# Patient Record
Sex: Male | Born: 1960
Health system: Southern US, Community
[De-identification: ages and names within clinical notes are randomized; demographics above are authoritative.]

## PROBLEM LIST (undated history)

## (undated) DIAGNOSIS — M1990 Unspecified osteoarthritis, unspecified site: Secondary | ICD-10-CM

## (undated) DIAGNOSIS — I10 Essential (primary) hypertension: Secondary | ICD-10-CM

## (undated) DIAGNOSIS — F419 Anxiety disorder, unspecified: Secondary | ICD-10-CM

## (undated) DIAGNOSIS — E119 Type 2 diabetes mellitus without complications: Secondary | ICD-10-CM

## (undated) DIAGNOSIS — E7800 Pure hypercholesterolemia, unspecified: Secondary | ICD-10-CM

## (undated) DIAGNOSIS — Z955 Presence of coronary angioplasty implant and graft: Secondary | ICD-10-CM

## (undated) DIAGNOSIS — I2510 Atherosclerotic heart disease of native coronary artery without angina pectoris: Secondary | ICD-10-CM

## (undated) HISTORY — DX: Presence of coronary angioplasty implant and graft: Z95.5

## (undated) HISTORY — PX: COLONOSCOPY: SHX174

## (undated) HISTORY — DX: Unspecified osteoarthritis, unspecified site: M19.90

## (undated) HISTORY — DX: Anxiety disorder, unspecified: F41.9

## (undated) HISTORY — PX: CORONARY ANGIOPLASTY WITH STENT PLACEMENT: SHX49

---

## 2009-04-23 ENCOUNTER — Ambulatory Visit: Payer: Self-pay | Admitting: Diagnostic Radiology

## 2009-04-23 ENCOUNTER — Emergency Department (HOSPITAL_BASED_OUTPATIENT_CLINIC_OR_DEPARTMENT_OTHER): Admission: EM | Admit: 2009-04-23 | Discharge: 2009-04-23 | Payer: Self-pay | Admitting: Emergency Medicine

## 2009-04-23 IMAGING — CR DG FINGER MIDDLE 2+V*R*
3 series · 3 of 3 positions shown · non-contrast
Comparison: None.

CLINICAL DATA: 48-year-old male with right middle finger pain.
Possible metal foreign body.

RIGHT MIDDLE FINGER 2+V

[x finger pa right]
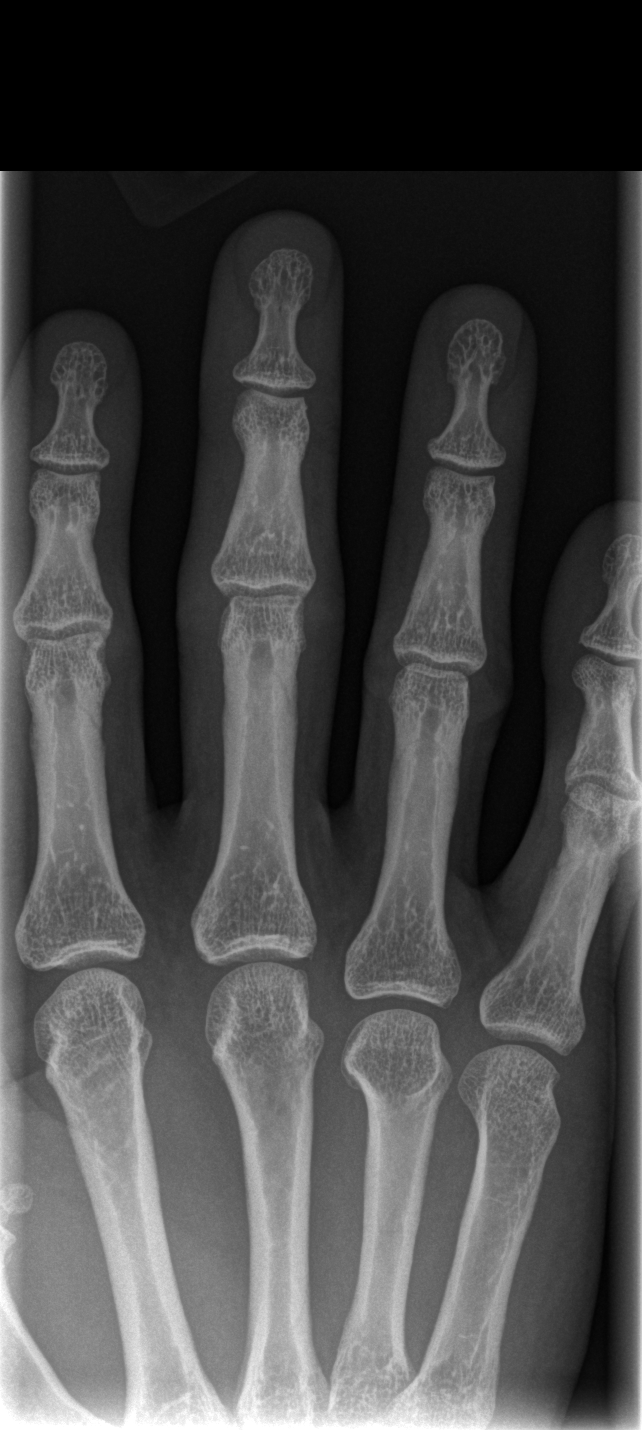

[x finger obl. right]
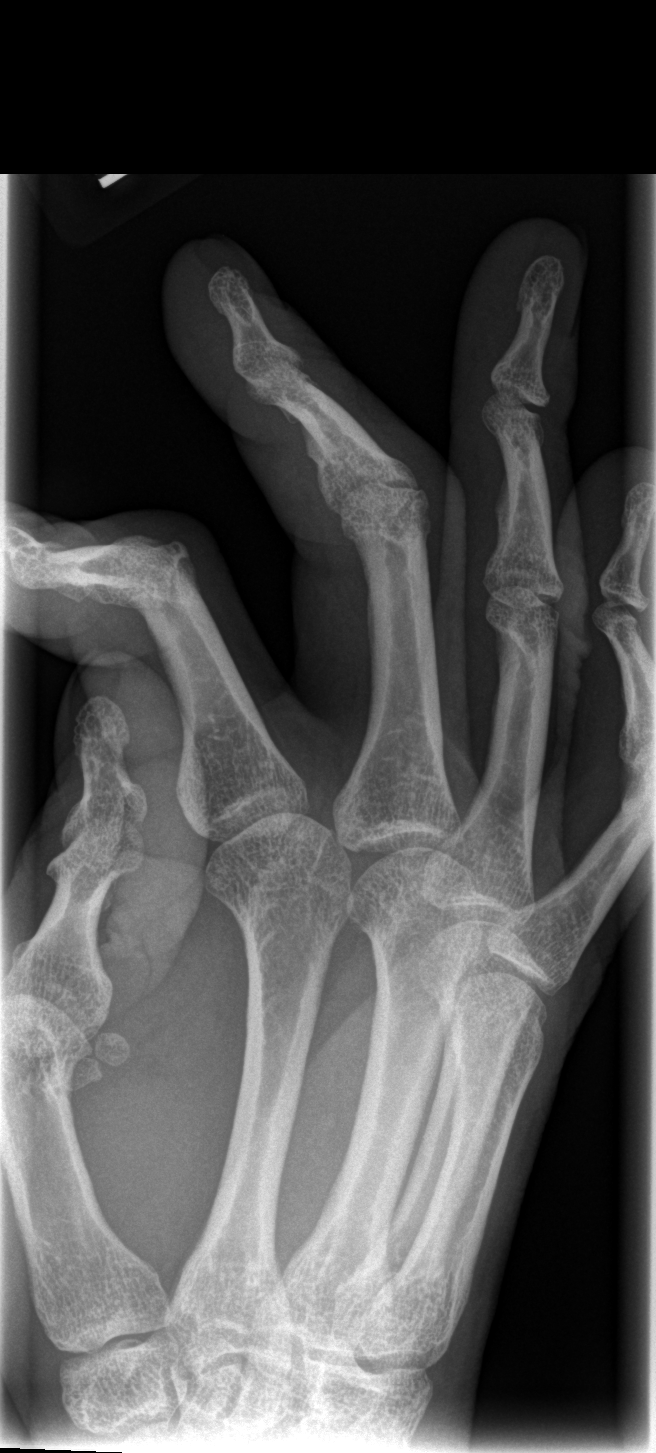

[x finger lateral right]
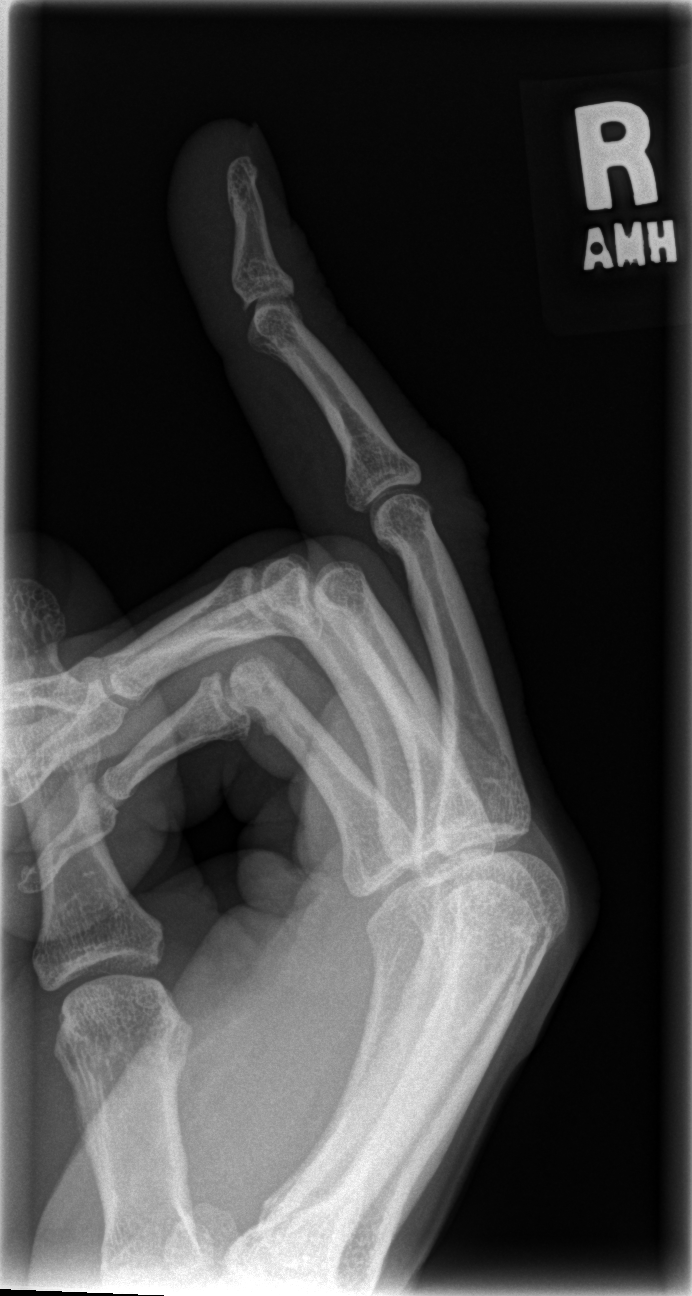

[3 of 3 positions shown; findings below may reference images not displayed]

FINDINGS: Bone mineralization is within normal limits.  Normal
joint spaces.  No acute fracture. No radiopaque foreign body
identified.
IMPRESSION: No radiopaque foreign body identified.  No acute fracture.

## 2013-11-27 DIAGNOSIS — T148XXA Other injury of unspecified body region, initial encounter: Secondary | ICD-10-CM | POA: Insufficient documentation

## 2015-03-17 ENCOUNTER — Encounter (HOSPITAL_BASED_OUTPATIENT_CLINIC_OR_DEPARTMENT_OTHER): Payer: Self-pay

## 2015-03-17 ENCOUNTER — Emergency Department (HOSPITAL_BASED_OUTPATIENT_CLINIC_OR_DEPARTMENT_OTHER)
Admission: EM | Admit: 2015-03-17 | Discharge: 2015-03-17 | Disposition: A | Discharge status: Home / Self Care | Payer: Self-pay | Attending: Emergency Medicine | Admitting: Emergency Medicine

## 2015-03-17 DIAGNOSIS — E119 Type 2 diabetes mellitus without complications: Secondary | ICD-10-CM | POA: Insufficient documentation

## 2015-03-17 DIAGNOSIS — R1030 Lower abdominal pain, unspecified: Secondary | ICD-10-CM | POA: Insufficient documentation

## 2015-03-17 HISTORY — DX: Type 2 diabetes mellitus without complications: E11.9

## 2015-03-17 LAB — URINALYSIS, ROUTINE W REFLEX MICROSCOPIC
BILIRUBIN URINE: NEGATIVE
Glucose, UA: >1000 mg/dL — AB
HGB URINE DIPSTICK: NEGATIVE
KETONES UR: NEGATIVE mg/dL
Leukocytes, UA: NEGATIVE
NITRITE: NEGATIVE
PH: 6 (ref 5.0–8.0)
Protein, ur: NEGATIVE mg/dL
SPECIFIC GRAVITY, URINE: 1.034 — AB (ref 1.005–1.030)
UROBILINOGEN UA: 1 mg/dL (ref 0.0–1.0)

## 2015-03-17 LAB — URINE MICROSCOPIC-ADD ON

## 2015-03-17 LAB — CBG MONITORING, ED: Glucose-Capillary: 203 mg/dL — ABNORMAL HIGH (ref 65–99)

## 2015-03-17 NOTE — ED Provider Notes (Signed)
TIME SEEN: 10:37 PM  CHIEF COMPLAINT: abdominal pain  HPI: : Dean Robertson is a 54 y.o. Male with a history of NIDDM who presents to the Emergency Department complaining of lower abdominal pain onset one month ago. He states that the pain started burning today which is why he came in. He states that he tried to go to [facility], but they were very busy and he left. He denies nausea, vomiting, diarrhea, melena, hematochezia, hematuria, urinary frequency or urgency, dysuria or hematuria, renal discharge, testicular pain or swelling, fever or chills. He denies travel outside the country or any sick contacts recently. He denies any significant medical history. He states that he is on medication for his DM but does not remember what it is called and denies checking his blood sugar often. He denies currently having a gastroenterologist. Has never had similar pain before. States he is having normal bowel movements and passing gas. Last bowel movement was this morning. Aggravating or relieving factors or radiation of pain.  ROS: See HPI Constitutional: no fever  Eyes: no drainage  ENT: no runny nose   Cardiovascular:  no chest pain  Resp: no SOB  GI: no vomiting GU: no dysuria Integumentary: no rash  Allergy: no hives  Musculoskeletal: no leg swelling  Neurological: no slurred speech ROS otherwise negative  PAST MEDICAL HISTORY/PAST SURGICAL HISTORY:  Past Medical History  Diagnosis Date  . Diabetes mellitus without complication     MEDICATIONS:  Prior to Admission medications   Not on File    ALLERGIES:  No Known Allergies  SOCIAL HISTORY:  History  Substance Use Topics  . Smoking status: Never Smoker   . Smokeless tobacco: Not on file  . Alcohol Use: No    FAMILY HISTORY: No family history on file.  EXAM: BP 125/85 mmHg  Pulse 73  Temp(Src) 97.8 F (36.6 C) (Oral)  Resp 18  Ht 5\' 8"  (1.727 m)  Wt 174 lb (78.926 kg)  BMI 26.46 kg/m2  SpO2 99%    CONSTITUTIONAL: Alert and oriented and responds appropriately to questions. Well-appearing; well-nourished HEAD: Normocephalic EYES: Conjunctivae clear, PERRL ENT: normal nose; no rhinorrhea; moist mucous membranes; pharynx without lesions noted NECK: Supple, no meningismus, no LAD  CARD: RRR; S1 and S2 appreciated; no murmurs, no clicks, no rubs, no gallops RESP: Normal chest excursion without splinting or tachypnea; breath sounds clear and equal bilaterally; no wheezes, no rhonchi, no rales, no hypoxia or respiratory distress, speaking full sentences ABD/GI: Normal bowel sounds; non-distended; soft, non-tender, no rebound, no guarding, no peritoneal signs, no tenderness at McBurney's point, negative Murphy sign GU:  Normal external genitalia, circumcised, normal urethral meatus without blood or discharge, no testicular tenderness or masses, no scrotal masses, 2+ femoral pulses bilaterally, no hernia BACK:  The back appears normal and is non-tender to palpation, there is no CVA tenderness EXT: Normal ROM in all joints; non-tender to palpation; no edema; normal capillary refill; no cyanosis, no calf tenderness or swelling    SKIN: Normal color for age and race; warm NEURO: Moves all extremities equally, sensation to light touch intact diffusely, cranial nerves II through XII intact PSYCH: The patient's mood and manner are appropriate. Grooming and personal hygiene are appropriate.  MEDICAL DECISION MAKING: Patient here with lower abdominal pain for over one month. Denies any other associated symptoms. He is afebrile, hemodynamically stable, nontoxic, smiling and laughing. His abdominal exam is completely benign. His urine shows no sign of infection. Have very low suspicion for  appendicitis, diverticulitis, colitis. I do not feel he needs emergent imaging given symptoms have been present for over one month and he is still very well-appearing. I do not feel labs would be helpful today as I think  they would likely be normal. I recommended he follow up with his primary care physician as well as a gastroenterologist if symptoms continue. Have recommended alternating Tylenol and Motrin for pain and increasing his water and fiber intake. Discussed return precautions. He verbalized understanding and is comfortable with this plan.      [place], DO 03/17/15 2333

## 2015-03-17 NOTE — ED Notes (Signed)
C/o abd pain x 1 month-denies n/v/d

## 2015-03-17 NOTE — Discharge Instructions (Signed)

## 2015-11-15 DIAGNOSIS — G629 Polyneuropathy, unspecified: Secondary | ICD-10-CM | POA: Insufficient documentation

## 2015-11-15 DIAGNOSIS — I2510 Atherosclerotic heart disease of native coronary artery without angina pectoris: Secondary | ICD-10-CM | POA: Insufficient documentation

## 2015-11-15 DIAGNOSIS — E785 Hyperlipidemia, unspecified: Secondary | ICD-10-CM | POA: Insufficient documentation

## 2016-09-02 ENCOUNTER — Emergency Department (HOSPITAL_BASED_OUTPATIENT_CLINIC_OR_DEPARTMENT_OTHER): Payer: Self-pay

## 2016-09-02 ENCOUNTER — Encounter (HOSPITAL_BASED_OUTPATIENT_CLINIC_OR_DEPARTMENT_OTHER): Payer: Self-pay | Admitting: Emergency Medicine

## 2016-09-02 ENCOUNTER — Emergency Department (HOSPITAL_BASED_OUTPATIENT_CLINIC_OR_DEPARTMENT_OTHER)
Admission: EM | Admit: 2016-09-02 | Discharge: 2016-09-02 | Disposition: A | Discharge status: Home / Self Care | Payer: Self-pay | Attending: Emergency Medicine | Admitting: Emergency Medicine

## 2016-09-02 DIAGNOSIS — H04123 Dry eye syndrome of bilateral lacrimal glands: Secondary | ICD-10-CM

## 2016-09-02 DIAGNOSIS — E119 Type 2 diabetes mellitus without complications: Secondary | ICD-10-CM | POA: Insufficient documentation

## 2016-09-02 DIAGNOSIS — Y929 Unspecified place or not applicable: Secondary | ICD-10-CM | POA: Insufficient documentation

## 2016-09-02 DIAGNOSIS — I10 Essential (primary) hypertension: Secondary | ICD-10-CM | POA: Insufficient documentation

## 2016-09-02 DIAGNOSIS — Z79899 Other long term (current) drug therapy: Secondary | ICD-10-CM | POA: Insufficient documentation

## 2016-09-02 DIAGNOSIS — Z7984 Long term (current) use of oral hypoglycemic drugs: Secondary | ICD-10-CM | POA: Insufficient documentation

## 2016-09-02 DIAGNOSIS — Z7982 Long term (current) use of aspirin: Secondary | ICD-10-CM | POA: Insufficient documentation

## 2016-09-02 DIAGNOSIS — S0501XA Injury of conjunctiva and corneal abrasion without foreign body, right eye, initial encounter: Secondary | ICD-10-CM | POA: Insufficient documentation

## 2016-09-02 DIAGNOSIS — Y9389 Activity, other specified: Secondary | ICD-10-CM | POA: Insufficient documentation

## 2016-09-02 DIAGNOSIS — S0500XA Injury of conjunctiva and corneal abrasion without foreign body, unspecified eye, initial encounter: Secondary | ICD-10-CM

## 2016-09-02 DIAGNOSIS — S0502XA Injury of conjunctiva and corneal abrasion without foreign body, left eye, initial encounter: Secondary | ICD-10-CM | POA: Insufficient documentation

## 2016-09-02 DIAGNOSIS — Y999 Unspecified external cause status: Secondary | ICD-10-CM | POA: Insufficient documentation

## 2016-09-02 DIAGNOSIS — X58XXXA Exposure to other specified factors, initial encounter: Secondary | ICD-10-CM | POA: Insufficient documentation

## 2016-09-02 HISTORY — DX: Essential (primary) hypertension: I10

## 2016-09-02 MED ORDER — ERYTHROMYCIN 5 MG/GM OP OINT: TOPICAL_OINTMENT | OPHTHALMIC | 0 refills | Status: DC

## 2016-09-02 MED ORDER — ARTIFICIAL TEARS OP OINT: TOPICAL_OINTMENT | OPHTHALMIC | 0 refills | Status: DC

## 2016-09-02 MED ORDER — TETRACAINE HCL 0.5 % OP SOLN
2.0000 [drp] | Freq: Once | OPHTHALMIC | Status: AC
  Administered 2016-09-02: 2 [drp] via OPHTHALMIC
  Filled 2016-09-02: qty 4

## 2016-09-02 MED ORDER — FLUORESCEIN SODIUM 1 MG OP STRP
1.0000 | ORAL_STRIP | Freq: Once | OPHTHALMIC | Status: AC
  Administered 2016-09-02: 1 via OPHTHALMIC
  Filled 2016-09-02: qty 1

## 2016-09-02 NOTE — ED Triage Notes (Signed)
Pt got metal in his eye 3 weeks ago, seen at [facility]. Pt reports onging bilateral eye pain and blurry vision since then.

## 2016-09-02 NOTE — ED Notes (Signed)
Pt given d/c instructions as per chart. Verbalizes understanding. No questions. Rx x 2. 

## 2016-09-02 NOTE — ED Provider Notes (Signed)
[facility] DEPT [facility] Provider Note   CSN: [id number] Arrival date & time: 09/02/16  1137     History   Chief Complaint Chief Complaint  Patient presents with  . Eye Pain    HPI Dean Robertson is a 55 y.o. male.  HPI 28 old male with past medical history hypertension and diabetes who presents with bilateral eye pain. The patient was working with metal 3 weeks ago when he was cutting it. He states the metal specs flew into his eye. He was seen at [facility] regional that time at which time several foreign bodies removed from his out side eye. He did not undergo a CT scan at that time. He was placed on romycin. Discussed follow-up with an ophthalmologist but hasn't. Since then, he has had worsening blurred vision and persistent foreign body sensation. He describes the feeling as a dry, stinging sensation in his eyes. Denies any other trauma. His tetanus is up-to-date. He also notes intermittent clear drainage from bilateral eyes.  Past Medical History:  Diagnosis Date  . Diabetes mellitus without complication ([place])   . Hypertension     There are no active problems to display for this patient.   Past Surgical History:  Procedure Laterality Date  . CORONARY ANGIOPLASTY WITH STENT PLACEMENT         Home Medications    Prior to Admission medications   Medication Sig Start Date End Date Taking? Authorizing Provider  aspirin EC 81 MG tablet Take 81 mg by mouth daily.   Yes Historical Provider, MD  lisinopril (PRINIVIL,ZESTRIL) 20 MG tablet Take 20 mg by mouth daily.   Yes Historical Provider, MD  metFORMIN (GLUCOPHAGE) 500 MG tablet Take by mouth 2 (two) times daily with a meal.   Yes Historical Provider, MD  artificial tears (LACRILUBE) OINT ophthalmic ointment Place into both eyes every 2 (two) hours while awake. 09/02/16   Duffy Bruce, MD  erythromycin ophthalmic ointment Place a 1/2 inch ribbon of ointment into the lower eyelid of both eyes twice daily for 5 days.  09/02/16   Duffy Bruce, MD    Family History No family history on file.  Social History Social History  Substance Use Topics  . Smoking status: Never Smoker  . Smokeless tobacco: Never Used  . Alcohol use No     Allergies   Review of patient's allergies indicates no known allergies.   Review of Systems Review of Systems  Constitutional: Negative for chills, fatigue and fever.  HENT: Negative for congestion and rhinorrhea.   Eyes: Positive for pain and discharge. Negative for redness and visual disturbance.  Respiratory: Negative for cough, shortness of breath and wheezing.   Cardiovascular: Negative for chest pain and leg swelling.  Gastrointestinal: Negative for abdominal pain, diarrhea, nausea and vomiting.  Genitourinary: Negative for dysuria and flank pain.  Musculoskeletal: Negative for neck pain and neck stiffness.  Skin: Negative for rash and wound.  Allergic/Immunologic: Negative for immunocompromised state.  Neurological: Negative for syncope, weakness and headaches.  All other systems reviewed and are negative.    Physical Exam Updated Vital Signs BP 117/87 (BP Location: Left Arm)   Pulse 62   Temp 97.9 F (36.6 C) (Oral)   Resp 16   Ht 5\' 8"  (1.727 m)   Wt 172 lb (78 kg)   SpO2 100%   BMI 26.15 kg/m   Physical Exam  Constitutional: He is oriented to person, place, and time. He appears well-developed and well-nourished. No distress.  HENT:  Head: Normocephalic and atraumatic.  Mouth/Throat: Oropharynx is clear and moist.  Eyes: Conjunctivae are normal.  Pupils equal, round, and reactive bilaterally. On slit lamp exam, patient noted to have superficial corneal abrasions bilaterally and diffuse, punctate fluorescein uptake. No significant rust rings. No abrasions or ulcerations within the visual field. Anterior chamber is quiet, round, without cell or flare. Visualized retina is unremarkable. Negative Seidel sign. Extraocular movements are full and  painless. No periorbital erythema or swelling.  Neck: Neck supple.  Cardiovascular: Normal rate, regular rhythm and normal heart sounds.  Exam reveals no friction rub.   No murmur heard. Pulmonary/Chest: Effort normal and breath sounds normal. No respiratory distress. He has no wheezes. He has no rales.  Abdominal: He exhibits no distension.  Musculoskeletal: He exhibits no edema.  Neurological: He is alert and oriented to person, place, and time. He exhibits normal muscle tone.  Skin: Skin is warm. Capillary refill takes less than 2 seconds.  Psychiatric: He has a normal mood and affect.  Nursing note and vitals reviewed.    ED Treatments / Results  Labs (all labs ordered are listed, but only abnormal results are displayed) Labs Reviewed - No data to display  EKG  EKG Interpretation None       Radiology Ct Orbits Wo Contrast  Result Date: 09/02/2016 CLINICAL DATA:  Metal in eye 3 weeks ago. Blurred vision right greater than left EXAM: CT ORBITS WITHOUT CONTRAST TECHNIQUE: Multidetector CT imaging of the orbits was performed following the standard protocol without intravenous contrast. COMPARISON:  None. FINDINGS: Orbits: No metal foreign body seen in the orbit. The globe is normal and the vitreous is normal bilaterally. There is no hemorrhage or edema in the orbital fat. Optic nerve and extraocular muscles normal. No mass lesion. Visualized sinuses: Negative Soft tissues: No soft tissue swelling Limited intracranial: Negative IMPRESSION: Negative CT of the orbits. No foreign body or mass. No evidence of orbital hemorrhage or edema. Electronically Signed   By: Franchot Gallo M.D.   On: 09/02/2016 15:35    Procedures Procedures (including critical care time)  Medications Ordered in ED Medications  tetracaine (PONTOCAINE) 0.5 % ophthalmic solution 2 drop (2 drops Both Eyes Given 09/02/16 1607)  fluorescein ophthalmic strip 1 strip (1 strip Both Eyes Given 09/02/16 1607)      Initial Impression / Assessment and Plan / ED Course  I have reviewed the triage vital signs and the nursing notes.  Pertinent labs & imaging results that were available during my care of the patient were reviewed by me and considered in my medical decision making (see chart for details).  Clinical Course    55 year old male who presents with bilateral persistent eye pain after metal foreign body 3 weeks ago. On exam, patient has likely dry eyes as well as healing corneal abrasion. No evidence of corneal ulceration. No hypopyon or signs of anterior uveitis. CT scan obtained given metallic injury shows no evidence of globe rupture or posterior foreign bodies. No large rust rings. Will give Romycin ointment, eye lubrication, and send for outpatient ophthalmology follow-up. No apparent emergent pathology for patient's ongoing complaints. Tetanus is up-to-date.  Final Clinical Impressions(s) / ED Diagnoses   Final diagnoses:  Corneal abrasion, unspecified laterality, initial encounter  Dry eyes    New Prescriptions Discharge Medication List as of 09/02/2016  4:01 PM    START taking these medications   Details  artificial tears (LACRILUBE) OINT ophthalmic ointment Place into both eyes every 2 (two) hours while  awake., Starting Sun 09/02/2016, Print    erythromycin ophthalmic ointment Place a 1/2 inch ribbon of ointment into the lower eyelid of both eyes twice daily for 5 days., Print         Duffy Bruce, MD 09/03/16 [phone]

## 2016-11-28 ENCOUNTER — Encounter (HOSPITAL_BASED_OUTPATIENT_CLINIC_OR_DEPARTMENT_OTHER): Payer: Self-pay

## 2016-11-28 ENCOUNTER — Emergency Department (HOSPITAL_BASED_OUTPATIENT_CLINIC_OR_DEPARTMENT_OTHER)
Admission: EM | Admit: 2016-11-28 | Discharge: 2016-11-28 | Disposition: A | Discharge status: Home / Self Care | Payer: Self-pay | Attending: Emergency Medicine | Admitting: Emergency Medicine

## 2016-11-28 DIAGNOSIS — Z7984 Long term (current) use of oral hypoglycemic drugs: Secondary | ICD-10-CM | POA: Insufficient documentation

## 2016-11-28 DIAGNOSIS — L02811 Cutaneous abscess of head [any part, except face]: Secondary | ICD-10-CM | POA: Insufficient documentation

## 2016-11-28 DIAGNOSIS — Z79899 Other long term (current) drug therapy: Secondary | ICD-10-CM | POA: Insufficient documentation

## 2016-11-28 DIAGNOSIS — I10 Essential (primary) hypertension: Secondary | ICD-10-CM | POA: Insufficient documentation

## 2016-11-28 DIAGNOSIS — E119 Type 2 diabetes mellitus without complications: Secondary | ICD-10-CM | POA: Insufficient documentation

## 2016-11-28 DIAGNOSIS — Z7982 Long term (current) use of aspirin: Secondary | ICD-10-CM | POA: Insufficient documentation

## 2016-11-28 MED ORDER — SULFAMETHOXAZOLE-TRIMETHOPRIM 800-160 MG PO TABS: 1.0000 | ORAL_TABLET | Freq: Two times a day (BID) | ORAL | 0 refills | Status: AC

## 2016-11-28 MED ORDER — HYDROCODONE-ACETAMINOPHEN 5-325 MG PO TABS: 2.0000 | ORAL_TABLET | ORAL | 0 refills | Status: DC | PRN

## 2016-11-28 NOTE — ED Provider Notes (Signed)
[facility] DEPT [facility] Provider Note   CSN: [id number] Arrival date & time: 11/28/16  1924  By signing my name below, I, Dean Robertson, attest that this documentation has been prepared under the direction and in the presence of [name], [place]. Electronically Signed: [name], Scribe. 11/28/2016. 11:19 PM.  History   Chief Complaint Chief Complaint  Patient presents with  . Abscess    The history is provided by the patient. No language interpreter was used.     HPI Comments: Dean Robertson is a 56 y.o. male who presents to the Emergency Department complaining of a moderate, gradually worsening area of redness and swelling to his posterior head for the last few days. He reports significant pain to the area that is worse with applied pressure. Wife states she punctured the area with a pin and achieved a small amount of drainage PTA. Pt is a diabetic controlled with pills. He denies fever, chills, or any other associated symptoms.  Past Medical History:  Diagnosis Date  . Diabetes mellitus without complication (Dean Robertson)   . Hypertension     There are no active problems to display for this patient.   Past Surgical History:  Procedure Laterality Date  . CORONARY ANGIOPLASTY WITH STENT PLACEMENT         Home Medications    Prior to Admission medications   Medication Sig Start Date End Date Taking? Authorizing Provider  artificial tears (LACRILUBE) OINT ophthalmic ointment Place into both eyes every 2 (two) hours while awake. 09/02/16   Duffy Bruce, MD  aspirin EC 81 MG tablet Take 81 mg by mouth daily.    Historical Provider, MD  erythromycin ophthalmic ointment Place a 1/2 inch ribbon of ointment into the lower eyelid of both eyes twice daily for 5 days. 09/02/16   Duffy Bruce, MD  lisinopril (PRINIVIL,ZESTRIL) 20 MG tablet Take 20 mg by mouth daily.    Historical Provider, MD  metFORMIN (GLUCOPHAGE) 500 MG tablet Take by mouth 2 (two) times daily with a meal.     Historical Provider, MD    Family History No family history on file.  Social History Social History  Substance Use Topics  . Smoking status: Never Smoker  . Smokeless tobacco: Never Used  . Alcohol use No     Allergies   Patient has no known allergies.   Review of Systems Review of Systems  Constitutional: Negative for chills and fever.  Skin: Positive for wound.  All other systems reviewed and are negative.    Physical Exam Updated Vital Signs BP (!) 160/106 (BP Location: Right Arm) Comment: Didnt take BP medication today   Pulse 74   Temp 98.1 F (36.7 C) (Oral)   Resp 18   Ht 5\' 9"  (1.753 m)   Wt 168 lb (76.2 kg)   SpO2 100%   BMI 24.81 kg/m   Physical Exam  Constitutional: He is oriented to person, place, and time. He appears well-developed and well-nourished. No distress.  HENT:  Head: Normocephalic.  Golf ball sized firm area with a small draining pustule to the center.  Eyes: Conjunctivae and EOM are normal.  Neck: Neck supple. No tracheal deviation present.  Cardiovascular: Normal rate.   Pulmonary/Chest: Effort normal. No respiratory distress.  Musculoskeletal: Normal range of motion.  Neurological: He is alert and oriented to person, place, and time.  Skin: Skin is warm and dry.  Psychiatric: He has a normal mood and affect. His behavior is normal.  Nursing note and vitals reviewed.  ED Treatments / Results  Labs (all labs ordered are listed, but only abnormal results are displayed) Labs Reviewed - No data to display  EKG  EKG Interpretation None       Radiology No results found.  Procedures .Marland KitchenIncision and Drainage Date/Time: 11/28/2016 11:40 PM Performed by: Fransico Meadow Authorized by: Fransico Meadow   Consent:    Consent obtained:  Verbal   Consent given by:  Patient Location:    Type:  Abscess   Size:  Golf ball   Location:  Head   Head location:  Scalp Pre-procedure details:    Skin preparation:   Betadine Anesthesia (see MAR for exact dosages):    Anesthesia method:  Local infiltration   Local anesthetic:  Bupivacaine 0.5% w/o epi Procedure type:    Complexity:  Simple Procedure details:    Incision types:  Stab incision   Incision depth:  Dermal   Wound management:  Probed and deloculated   Drainage:  Purulent   Drainage amount:  Scant   Wound treatment:  Wound left open Post-procedure details:    Patient tolerance of procedure:  Tolerated well, no immediate complications Comments:     Minimal drainage. Area remains firm. Will treat with antibiotics.   (including critical care time)  DIAGNOSTIC STUDIES: Oxygen Saturation is 100% on RA, normal by my interpretation.    COORDINATION OF CARE: 11:23 PM Discussed treatment plan with pt at bedside and pt agreed to plan.  Medications Ordered in ED Medications - No data to display   Initial Impression / Assessment and Plan / ED Course  I have reviewed the triage vital signs and the nursing notes.  Pertinent labs & imaging results that were available during my care of the patient were reviewed by me and considered in my medical decision making (see chart for details).       Final Clinical Impressions(s) / ED Diagnoses  Patient with skin abscess. Incision and drainage performed in the ED today.  Abscess was not large enough to warrant packing or drain placement. Wound recheck in 2 days. Supportive care and return precautions discussed.  Pt sent home with . The patient appears reasonably screened and/or stabilized for discharge and I doubt any other emergent medical condition requiring further screening, evaluation, or treatment in the ED prior to discharge. Final diagnoses:  Abscess, scalp    New Prescriptions Discharge Medication List as of 11/28/2016 11:41 PM    START taking these medications   Details  HYDROcodone-acetaminophen (NORCO/VICODIN) 5-325 MG tablet Take 2 tablets by mouth every 4 (four) hours as needed.,  Starting Wed 11/28/2016, Print    sulfamethoxazole-trimethoprim (BACTRIM DS,SEPTRA DS) 800-160 MG tablet Take 1 tablet by mouth 2 (two) times daily., Starting Wed 11/28/2016, Until Wed 12/05/2016, Print       An After Visit Summary was printed and given to the patient.  I personally performed the services in this documentation, which was scribed in my presence.  The recorded information has been reviewed and considered.   Ronnald Collum.   Hollace Kinnier [place], PA-C 11/29/16 1611    Julianne Rice, MD 11/30/16 [phone]

## 2016-11-28 NOTE — ED Triage Notes (Signed)
C/o abscess to back of head-NAD-steady gait

## 2016-11-28 NOTE — Discharge Instructions (Signed)
See your Physician for recheck in 2-3 days  

## 2017-09-02 ENCOUNTER — Encounter (HOSPITAL_BASED_OUTPATIENT_CLINIC_OR_DEPARTMENT_OTHER): Payer: Self-pay | Admitting: Emergency Medicine

## 2017-09-02 ENCOUNTER — Emergency Department (HOSPITAL_BASED_OUTPATIENT_CLINIC_OR_DEPARTMENT_OTHER)
Admission: EM | Admit: 2017-09-02 | Discharge: 2017-09-02 | Disposition: A | Discharge status: Home / Self Care | Payer: Self-pay | Attending: Emergency Medicine | Admitting: Emergency Medicine

## 2017-09-02 DIAGNOSIS — R739 Hyperglycemia, unspecified: Secondary | ICD-10-CM

## 2017-09-02 DIAGNOSIS — E1165 Type 2 diabetes mellitus with hyperglycemia: Secondary | ICD-10-CM | POA: Insufficient documentation

## 2017-09-02 DIAGNOSIS — I10 Essential (primary) hypertension: Secondary | ICD-10-CM | POA: Insufficient documentation

## 2017-09-02 DIAGNOSIS — Z7982 Long term (current) use of aspirin: Secondary | ICD-10-CM | POA: Insufficient documentation

## 2017-09-02 DIAGNOSIS — R3589 Other polyuria: Secondary | ICD-10-CM

## 2017-09-02 DIAGNOSIS — R350 Frequency of micturition: Secondary | ICD-10-CM | POA: Insufficient documentation

## 2017-09-02 DIAGNOSIS — Z7984 Long term (current) use of oral hypoglycemic drugs: Secondary | ICD-10-CM | POA: Insufficient documentation

## 2017-09-02 DIAGNOSIS — Z79899 Other long term (current) drug therapy: Secondary | ICD-10-CM | POA: Insufficient documentation

## 2017-09-02 DIAGNOSIS — R358 Other polyuria: Secondary | ICD-10-CM

## 2017-09-02 LAB — URINALYSIS, MICROSCOPIC (REFLEX): RBC / HPF: NONE SEEN RBC/hpf (ref 0–5)

## 2017-09-02 LAB — CBC
HCT: 44.4 % (ref 39.0–52.0)
Hemoglobin: 15.4 g/dL (ref 13.0–17.0)
MCH: 29.6 pg (ref 26.0–34.0)
MCHC: 34.7 g/dL (ref 30.0–36.0)
MCV: 85.2 fL (ref 78.0–100.0)
PLATELETS: 243 10*3/uL (ref 150–400)
RBC: 5.21 MIL/uL (ref 4.22–5.81)
RDW: 11.6 % (ref 11.5–15.5)
WBC: 4.8 10*3/uL (ref 4.0–10.5)

## 2017-09-02 LAB — URINALYSIS, ROUTINE W REFLEX MICROSCOPIC
BILIRUBIN URINE: NEGATIVE
Glucose, UA: >=500 mg/dL — AB
Hgb urine dipstick: NEGATIVE
KETONES UR: NEGATIVE mg/dL
LEUKOCYTES UA: NEGATIVE
NITRITE: NEGATIVE
PROTEIN: NEGATIVE mg/dL
Specific Gravity, Urine: 1.01 (ref 1.005–1.030)
pH: 6 (ref 5.0–8.0)

## 2017-09-02 LAB — BASIC METABOLIC PANEL
Anion gap: 10 (ref 5–15)
BUN: 24 mg/dL — AB (ref 6–20)
CALCIUM: 9.4 mg/dL (ref 8.9–10.3)
CO2: 25 mmol/L (ref 22–32)
Chloride: 97 mmol/L — ABNORMAL LOW (ref 101–111)
Creatinine, Ser: 1.12 mg/dL (ref 0.61–1.24)
GFR calc Af Amer: >60 mL/min (ref >60)
GLUCOSE: 390 mg/dL — AB (ref 65–99)
Potassium: 4.3 mmol/L (ref 3.5–5.1)
SODIUM: 132 mmol/L — AB (ref 135–145)

## 2017-09-02 LAB — CBG MONITORING, ED
GLUCOSE-CAPILLARY: 368 mg/dL — AB (ref 65–99)
Glucose-Capillary: 291 mg/dL — ABNORMAL HIGH (ref 65–99)

## 2017-09-02 MED ORDER — METFORMIN HCL 500 MG PO TABS: 500.0000 mg | ORAL_TABLET | Freq: Two times a day (BID) | ORAL | 0 refills | Status: DC

## 2017-09-02 MED ORDER — SODIUM CHLORIDE 0.9 % IV BOLUS (SEPSIS)
1000.0000 mL | Freq: Once | INTRAVENOUS | Status: AC
  Administered 2017-09-02: 1000 mL via INTRAVENOUS

## 2017-09-02 MED ORDER — SIMVASTATIN 20 MG PO TABS: 20.0000 mg | ORAL_TABLET | Freq: Every day | ORAL | 0 refills | Status: DC

## 2017-09-02 MED FILL — SIMVASTATIN 20 MG TABLET: 20 | 30 days supply | Qty: 30 | Fill #0

## 2017-09-02 MED FILL — metFORMIN HCL 500 MG TABS: 500 | 30 days supply | Qty: 60 | Fill #0

## 2017-09-02 NOTE — ED Triage Notes (Signed)
Patient reports that he has had frequent urination x 2 months.

## 2017-09-02 NOTE — ED Notes (Signed)
Patient reports that he is out of all of his medications. Reports that he has not checked his sugar in some time

## 2017-09-02 NOTE — ED Provider Notes (Signed)
[facility]ERG[facility]Y DEPARTMENT Provider Note   CSN: [id number] Arrival date & time: 09/02/17  0757     History   Chief Complaint Chief Complaint  Patient presents with  . Dysuria    HPI Dean Robertson is a 56 y.o. male.  HPI   Has been out of medications for 4 months, including DM, htn, chol meds because lost insurance Began having urinary frequency 2 months ago, waking frequently at night to urinate. Has been drinking water and gatorade to help keep from getting thirsty. No nausea, vomiting, dysuria, penile discharge. No flank pain, no chest pain or dyspnea.  Yesterday did have some stomach cramping, ate steak yesterday evening, feels improved. No abdominal pain today.     Past Medical History:  Diagnosis Date  . Diabetes mellitus without complication ([place])   . Hypertension     There are no active problems to display for this patient.   Past Surgical History:  Procedure Laterality Date  . CORONARY ANGIOPLASTY WITH STENT PLACEMENT         Home Medications    Prior to Admission medications   Medication Sig Start Date End Date Taking? Authorizing Provider  artificial tears (LACRILUBE) OINT ophthalmic ointment Place into both eyes every 2 (two) hours while awake. 09/02/16   Duffy Bruce, MD  aspirin EC 81 MG tablet Take 81 mg by mouth daily.    [provider]  erythromycin ophthalmic ointment Place a 1/2 inch ribbon of ointment into the lower eyelid of both eyes twice daily for 5 days. 09/02/16   Duffy Bruce, MD  HYDROcodone-acetaminophen (NORCO/VICODIN) 5-325 MG tablet Take 2 tablets by mouth every 4 (four) hours as needed. 11/28/16   Fransico Meadow, PA-C  lisinopril (PRINIVIL,ZESTRIL) 20 MG tablet Take 20 mg by mouth daily.    [provider]  metFORMIN (GLUCOPHAGE) 500 MG tablet Take 1 tablet (500 mg total) by mouth 2 (two) times daily with a meal. 09/02/17   Gareth Morgan, MD  simvastatin (ZOCOR) 20 MG tablet Take 1  tablet (20 mg total) by mouth daily. 09/02/17   Gareth Morgan, MD    Family History History reviewed. No pertinent family history.  Social History Social History  Substance Use Topics  . Smoking status: Never Smoker  . Smokeless tobacco: Never Used  . Alcohol use No     Allergies   Patient has no known allergies.   Review of Systems Review of Systems  Constitutional: Negative for fever.  HENT: Negative for sore throat.   Eyes: Negative for visual disturbance.  Respiratory: Negative for shortness of breath.   Cardiovascular: Negative for chest pain.  Gastrointestinal: Negative for abdominal pain, constipation, diarrhea, nausea and vomiting.  Endocrine: Positive for polyuria.  Genitourinary: Positive for frequency. Negative for difficulty urinating.  Musculoskeletal: Negative for back pain and neck stiffness.  Skin: Negative for rash.  Neurological: Negative for syncope and headaches. Light-headedness: yesterday now resolved, when went from sitting to standing.     Physical Exam Updated Vital Signs BP (!) 127/91   Pulse 64   Temp 98.5 F (36.9 C) (Oral)   Resp 18   Ht 5\' 9"  (1.753 m)   Wt 73.5 kg (162 lb)   SpO2 97%   BMI 23.92 kg/m   Physical Exam  Constitutional: He is oriented to person, place, and time. He appears well-developed and well-nourished. No distress.  HENT:  Head: Normocephalic and atraumatic.  Eyes: Conjunctivae and EOM are normal.  Neck: Normal range of motion.  Cardiovascular: Normal rate, regular rhythm, normal heart sounds and intact distal pulses.  Exam reveals no gallop and no friction rub.   No murmur heard. Pulmonary/Chest: Effort normal and breath sounds normal. No respiratory distress. He has no wheezes. He has no rales.  Abdominal: Soft. He exhibits no distension. There is no tenderness. There is no guarding.  Musculoskeletal: He exhibits no edema.  Neurological: He is alert and oriented to person, place, and time.  Skin: Skin is  warm and dry. He is not diaphoretic.  Nursing note and vitals reviewed.    ED Treatments / Results  Labs (all labs ordered are listed, but only abnormal results are displayed) Labs Reviewed  BASIC METABOLIC PANEL - Abnormal; Notable for the following:       Result Value   Sodium 132 (*)    Chloride 97 (*)    Glucose, Bld 390 (*)    BUN 24 (*)    All other components within normal limits  URINALYSIS, ROUTINE W REFLEX MICROSCOPIC - Abnormal; Notable for the following:    Glucose, UA >=500 (*)    All other components within normal limits  URINALYSIS, MICROSCOPIC (REFLEX) - Abnormal; Notable for the following:    Bacteria, UA RARE (*)    Squamous Epithelial / LPF 0-5 (*)    All other components within normal limits  CBG MONITORING, ED - Abnormal; Notable for the following:    Glucose-Capillary 368 (*)    All other components within normal limits  CBG MONITORING, ED - Abnormal; Notable for the following:    Glucose-Capillary 291 (*)    All other components within normal limits  CBC    EKG  EKG Interpretation None       Radiology No results found.  Procedures Procedures (including critical care time)  Medications Ordered in ED Medications  sodium chloride 0.9 % bolus 1,000 mL (0 mLs Intravenous Stopped 09/02/17 1003)     Initial Impression / Assessment and Plan / ED Course  I have reviewed the triage vital signs and the nursing notes.  Pertinent labs & imaging results that were available during my care of the patient were reviewed by me and considered in my medical decision making (see chart for details).     56 year old male with a history of hypertension diabetes presents with concern for urinary frequency in the setting of not taking his medications for the last 4 months in setting of losing his insurance.  Other than urinary frequency, he denies any other symptoms today.  Labs done show hyperglycemia without signs of diabetic ketoacidosis or other significant  electrolyte abnormalities.  Urinalysis shows no sign of infection. CBC WNL.  Patient urinary frequency is likely secondary to hyperglycemia.  Patient given IV fluids for hydration, with recheck of glucose improving.  Given a prescription for metformin 500 mg twice daily, and refill of his simvastatin 20 mg.  Discussed that given his blood pressures are within normal limits today with patient other blood pressure medications, we will not refill this medication at this time.  Consulted case management to set up primary care physician follow-up. Patient discharged in stable condition with understanding of reasons to return.   Final Clinical Impressions(s) / ED Diagnoses   Final diagnoses:  Hyperglycemia  Polyuria    New Prescriptions Discharge Medication List as of 09/02/2017 10:03 AM    START taking these medications   Details  simvastatin (ZOCOR) 20 MG tablet Take 1 tablet (20 mg total) by mouth daily., Starting Mon 09/02/2017,  Print         Gareth Morgan, MD 09/02/17 8550

## 2017-09-19 ENCOUNTER — Encounter: Payer: Self-pay | Admitting: Family Medicine

## 2017-09-19 ENCOUNTER — Ambulatory Visit (INDEPENDENT_AMBULATORY_CARE_PROVIDER_SITE_OTHER): Payer: Self-pay | Admitting: Family Medicine

## 2017-09-19 VITALS — BP 122/90 | HR 70 | Temp 97.9°F | Resp 14 | Ht 69.0 in | Wt 165.0 lb

## 2017-09-19 DIAGNOSIS — Z1159 Encounter for screening for other viral diseases: Secondary | ICD-10-CM

## 2017-09-19 DIAGNOSIS — E1165 Type 2 diabetes mellitus with hyperglycemia: Secondary | ICD-10-CM

## 2017-09-19 DIAGNOSIS — H6123 Impacted cerumen, bilateral: Secondary | ICD-10-CM

## 2017-09-19 DIAGNOSIS — I10 Essential (primary) hypertension: Secondary | ICD-10-CM

## 2017-09-19 DIAGNOSIS — G252 Other specified forms of tremor: Secondary | ICD-10-CM

## 2017-09-19 DIAGNOSIS — Z23 Encounter for immunization: Secondary | ICD-10-CM

## 2017-09-19 LAB — POCT URINALYSIS DIP (DEVICE)
BILIRUBIN URINE: NEGATIVE
GLUCOSE, UA: 500 mg/dL — AB
Hgb urine dipstick: NEGATIVE
KETONES UR: NEGATIVE mg/dL
Leukocytes, UA: NEGATIVE
Nitrite: NEGATIVE
Protein, ur: NEGATIVE mg/dL
Specific Gravity, Urine: 1.02 (ref 1.005–1.030)
Urobilinogen, UA: 0.2 mg/dL (ref 0.0–1.0)
pH: 5.5 (ref 5.0–8.0)

## 2017-09-19 LAB — MICROALBUMIN / CREATININE URINE RATIO
Creatinine, Urine: 109 mg/dL (ref 20–320)
Microalb, Ur: <0.2 mg/dL

## 2017-09-19 LAB — GLUCOSE, CAPILLARY: GLUCOSE-CAPILLARY: 287 mg/dL — AB (ref 65–99)

## 2017-09-19 LAB — POCT GLYCOSYLATED HEMOGLOBIN (HGB A1C): HEMOGLOBIN A1C: 12.8

## 2017-09-19 MED ORDER — METFORMIN HCL 500 MG PO TABS: 1000.0000 mg | ORAL_TABLET | Freq: Two times a day (BID) | ORAL | 5 refills | Status: DC

## 2017-09-19 MED ORDER — LANCETS MISC: 1.0000 | Freq: Three times a day (TID) | 11 refills | Status: DC

## 2017-09-19 MED ORDER — GLUCOSE BLOOD VI STRP: ORAL_STRIP | 12 refills | Status: DC

## 2017-09-19 MED ORDER — INSULIN GLARGINE 100 UNIT/ML SOLOSTAR PEN: 20.0000 [IU] | PEN_INJECTOR | Freq: Every day | SUBCUTANEOUS | 99 refills | Status: DC

## 2017-09-19 MED ORDER — TRUE METRIX AIR GLUCOSE METER W/DEVICE KIT: 1.0000 | PACK | Freq: Three times a day (TID) | 0 refills | Status: DC

## 2017-09-19 MED FILL — TRUE METRIX TEST STRIP: 30 days supply | Qty: 100 | Fill #0

## 2017-09-19 MED FILL — !TRUE METRIX BLOOD GLUCOSE: 365 days supply | Qty: 1 | Fill #0

## 2017-09-19 MED FILL — ?METFORMIN HCL 500MG TABLET: 500 | 30 days supply | Qty: 120 | Fill #0

## 2017-09-19 MED FILL — !LANTUS SOLOSTAR 100UNITS/M: 100 | 30 days supply | Qty: 6 | Fill #0

## 2017-09-19 MED FILL — TRUEplus LANCETS 28G MISC: 30 days supply | Qty: 100 | Fill #0

## 2017-09-19 NOTE — Progress Notes (Signed)
Subjective:    Patient ID: Dean Robertson, male    DOB: [birth year], 56 y.o.   MRN: [id number]  HPI Dean Robertson, a 56 year old male with a history of type 2 diabetes mellitus and hypertension presents to establish care.  Patient has not had a primary provider due to financial and insurance constraints.  He is mostly been using the emergency department and urgent care for all primary needs.  Patient was recently treated and evaluated in the emergency department on September 02, 2017.  He states that he began having urinary frequency and would awaken throughout the night to urinate.  Prior to  reporting to the emergency department, patient was diagnosed with type 2 diabetes and had been out of medications.  Patient was given a refill of metformin 500 mg twice daily and simvastatin.  He endorses polyuria.  He denies blurred vision, dizziness, nausea, vomiting, diarrhea, polydipsia or polyphagia. Patient also has a history of hypertension.  He is not been on antihypertensive medication for greater than a year.  He does not follow a low-fat, low-sodium diet or exercise routinely.  He denies chest pains, heart palpitations, syncope, shortness of breath, or bilateral lower extremity edema.  Patient's cardiac risk factors include dyslipidemia and former smoker.  Past Medical History:  Diagnosis Date  . Diabetes mellitus without complication ([place])   . Hypertension    Social History   Socioeconomic History  . Marital status: Single    Spouse name: Not on file  . Number of children: Not on file  . Years of education: Not on file  . Highest education level: Not on file  Social Needs  . Financial resource strain: Not on file  . Food insecurity - worry: Not on file  . Food insecurity - inability: Not on file  . Transportation needs - medical: Not on file  . Transportation needs - non-medical: Not on file  Occupational History  . Not on file  Tobacco Use  . Smoking status: Never Smoker  . Smokeless  tobacco: Never Used  Substance and Sexual Activity  . Alcohol use: No  . Drug use: No  . Sexual activity: Not on file  Other Topics Concern  . Not on file  Social History Narrative  . Not on file   Immunization History  Administered Date(s) Administered  . Influenza,inj,Quad PF,6+ Mos 09/19/2017  . Pneumococcal Polysaccharide-23 09/19/2017   No Known Allergies  Review of Systems  Constitutional: Negative.   HENT: Negative.   Eyes: Negative.   Respiratory: Negative.   Cardiovascular: Negative.   Gastrointestinal: Negative.   Endocrine: Positive for polyuria. Negative for polydipsia.  Musculoskeletal: Negative.   Skin: Negative.   Allergic/Immunologic: Negative for immunocompromised state.  Neurological: Positive for numbness (Lower extremities).  Hematological: Negative.   Psychiatric/Behavioral: Negative.        Objective:   Physical Exam  Constitutional: He is oriented to person, place, and time.  HENT:  Head: Normocephalic and atraumatic.  Right Ear: External ear normal.  Left Ear: External ear normal.  Nose: Nose normal.  Mouth/Throat: Oropharynx is clear and moist.  Eyes: Pupils are equal, round, and reactive to light.  Neck: Normal range of motion. Neck supple.  Cardiovascular: Normal rate, regular rhythm, normal heart sounds and intact distal pulses.  Pulmonary/Chest: Effort normal and breath sounds normal.  Abdominal: Soft. Bowel sounds are normal.  Neurological: He is alert and oriented to person, place, and time. He has normal reflexes. He displays tremor. Coordination and gait normal.  Fine  tremor  Skin: Skin is warm and dry.  Psychiatric: He has a normal mood and affect. His behavior is normal. Judgment and thought content normal.      BP 122/90 (BP Location: Left Arm, Patient Position: Sitting, Cuff Size: Small)   Pulse 70   Temp 97.9 F (36.6 C) (Oral)   Resp 14   Ht 5' 9"  (1.753 m)   Wt 165 lb (74.8 kg)   SpO2 100%   BMI 24.37 kg/m   Assessment & Plan:  1. Uncontrolled type 2 diabetes mellitus with hyperglycemia (HCC) Current hemoglobin A1c is 12.8, which is above goal.  Discussed diabetes at length.  Discussed the importance of checking blood sugars throughout the day.  Will start a trial of Lantus 20 units at bedtime, will also continue metformin 1000 mg twice daily.  Discussed the importance of following a carbohydrate modified diet.  Hemoglobin A1c goal is less than 7 according to the ADA. - Glucose, capillary - POCT urinalysis dip (device) - POCT glycosylated hemoglobin (Hb A1C) - COMPLETE METABOLIC PANEL WITH GFR - Lipid Panel - metFORMIN (GLUCOPHAGE) 500 MG tablet; Take 2 tablets (1,000 mg total) 2 (two) times daily with a meal by mouth.  Dispense: 60 tablet; Refill: 5 - Insulin Glargine (LANTUS SOLOSTAR) 100 UNIT/ML Solostar Pen; Inject 20 Units daily at 10 pm into the skin.  Dispense: 5 pen; Refill: PRN - glucose blood (TRUE METRIX BLOOD GLUCOSE TEST) test strip; Use as instructed  Dispense: 100 each; Refill: 12 - Blood Glucose Monitoring Suppl (TRUE METRIX AIR GLUCOSE METER) w/Device KIT; 1 each 3 (three) times daily by Does not apply route.  Dispense: 1 kit; Refill: 0 - Lancets MISC; 1 each 3 (three) times daily by Does not apply route.  Dispense: 100 each; Refill: 11 - Microalbumin/Creatinine Ratio, Urine  2. Essential hypertension Blood pressure is within a normal range.  No antihypertensive medications warranted at this time - EKG 12-Lead - Microalbumin/Creatinine Ratio, Urine  3. Need for hepatitis C screening test  - Hepatitis C Antibody  4. Influenza vaccination given - Flu Vaccine QUAD 6+ mos PF IM (Fluarix Quad PF)  5. Immunization due - Pneumococcal polysaccharide vaccine 23-valent greater than or equal to 2yo subcutaneous/IM  6. Fine tremor Fine tremor present.  We will continue to monitor closely.  Patient may warrant referral to neurology for further workup and evaluation.  7. Bilateral  impacted cerumen - Ear Lavage    RTC: One month for uncontrolled type 2 diabetes mellitus  [name]  MSN, FNP-C [facility] [address] [place], [place] [address] [phone]    Greater than 50% of appointment spent counseling on type 2 diabetes mellitus

## 2017-09-19 NOTE — Patient Instructions (Addendum)
Thanks for establishing care with [place] today. Your hemoglobin A1c is 12.8 on today, the goal is less than 7.  We will start Lantus 20 units at bedtime.  Lantus is considered a basal or long-acting insulin.  It will last for 24 hours and it does not peak.  You will continue Metformin at 1000 mg twice daily with meals.  He will also follow a carbohydrate modified diet divided over 5-6 small meals throughout the day.  Increase your water intake to 6-8 glasses.  Also recommend a low impact cardiovascular exercise routine.  You will continue lisinopril 20 mg daily, your blood pressure is controlled so no changes were warranted.  Continue statin therapy for hyperlipidemia and an 81 mg aspirin daily as a stroke preventative.    You will need a diabetic eye exam, as financial assistance is approved I will send a referral.   You may also warrant a referral to neurology for bilateral fine tremor.

## 2017-09-20 LAB — COMPLETE METABOLIC PANEL WITH GFR
AG Ratio: 1.7 (calc) (ref 1.0–2.5)
ALKALINE PHOSPHATASE (APISO): 58 U/L (ref 40–115)
ALT: 31 U/L (ref 9–46)
AST: 24 U/L (ref 10–35)
Albumin: 4.5 g/dL (ref 3.6–5.1)
BILIRUBIN TOTAL: 0.6 mg/dL (ref 0.2–1.2)
BUN/Creatinine Ratio: 24 (calc) — ABNORMAL HIGH (ref 6–22)
BUN: 27 mg/dL — AB (ref 7–25)
CHLORIDE: 98 mmol/L (ref 98–110)
CO2: 22 mmol/L (ref 20–32)
Calcium: 9.6 mg/dL (ref 8.6–10.3)
Creat: 1.14 mg/dL (ref 0.70–1.33)
GFR, Est African American: 82 mL/min/{1.73_m2} (ref >=60)
GFR, Est Non African American: 71 mL/min/{1.73_m2} (ref >=60)
GLUCOSE: 255 mg/dL — AB (ref 65–99)
Globulin: 2.6 g/dL (calc) (ref 1.9–3.7)
Potassium: 4.8 mmol/L (ref 3.5–5.3)
Sodium: 134 mmol/L — ABNORMAL LOW (ref 135–146)
Total Protein: 7.1 g/dL (ref 6.1–8.1)

## 2017-09-20 LAB — LIPID PANEL
CHOLESTEROL: 220 mg/dL — AB (ref <200)
HDL: 90 mg/dL (ref >40)
LDL CHOLESTEROL (CALC): 114 mg/dL — AB
Non-HDL Cholesterol (Calc): 130 mg/dL (calc) — ABNORMAL HIGH (ref <130)
TRIGLYCERIDES: 64 mg/dL (ref <150)
Total CHOL/HDL Ratio: 2.4 (calc) (ref <5.0)

## 2017-09-20 LAB — HEPATITIS C ANTIBODY
Hepatitis C Ab: NONREACTIVE
SIGNAL TO CUT-OFF: 0.01 (ref <1.00)

## 2017-09-21 ENCOUNTER — Other Ambulatory Visit: Payer: Self-pay | Admitting: Family Medicine

## 2017-09-21 DIAGNOSIS — E7849 Other hyperlipidemia: Secondary | ICD-10-CM

## 2017-09-21 MED ORDER — SIMVASTATIN 20 MG PO TABS: 20.0000 mg | ORAL_TABLET | Freq: Every day | ORAL | 2 refills | Status: DC

## 2017-09-23 ENCOUNTER — Telehealth: Payer: Self-pay

## 2017-09-23 NOTE — Telephone Encounter (Signed)
-----   Message from Dorena Dew, [place] [facility] sent at 09/21/2017  8:26 AM EST ----- Regarding: lab results Please inform patient that total cholesterol is 220, which is above goal. Goal is < 200. Will continue Atorvastatin 20 mg every evening with dinner. Recommend a lowfat, low carbohydrate diet divided over 5-6 small meals, increase water intake to 6-8 glasses, and 150 minutes per week of cardiovascular exercise.    Medication has been sent to [facility]   Thanks

## 2017-09-23 NOTE — Telephone Encounter (Signed)
Called, no answer. Left a message for patient to call back. Thanks!  

## 2017-09-24 NOTE — Telephone Encounter (Signed)
Called. NO answer. Left a message for patient to call back. Thanks!

## 2017-09-24 NOTE — Telephone Encounter (Signed)
Patient returned call. I advised of elevated cholesterol and that we will continue on atorvastatin 20mg  once daily with dinner. Advised to eat a low fat/low cholesterol diet over 5 to 6 small meals daily. Asked to drink 6 to 8 glasses of water daily and exercise 150 minutes weekly of cardio. Patient verbalized understanding. Thanks!

## 2017-09-25 MED FILL — SIMVASTATIN 20 MG TABLET: 20 | 30 days supply | Qty: 30 | Fill #0

## 2017-10-03 ENCOUNTER — Ambulatory Visit: Payer: Self-pay | Admitting: Family Medicine

## 2017-10-04 ENCOUNTER — Encounter (HOSPITAL_COMMUNITY): Payer: Self-pay | Admitting: Pharmacy Technician

## 2017-10-04 ENCOUNTER — Encounter: Payer: Self-pay | Admitting: Family Medicine

## 2017-10-04 ENCOUNTER — Ambulatory Visit (INDEPENDENT_AMBULATORY_CARE_PROVIDER_SITE_OTHER): Payer: Self-pay | Admitting: Family Medicine

## 2017-10-04 ENCOUNTER — Emergency Department (HOSPITAL_COMMUNITY)
Admission: EM | Admit: 2017-10-04 | Discharge: 2017-10-04 | Disposition: A | Discharge status: Home / Self Care | Payer: Self-pay | Attending: Emergency Medicine | Admitting: Emergency Medicine

## 2017-10-04 ENCOUNTER — Emergency Department (HOSPITAL_COMMUNITY): Payer: Self-pay

## 2017-10-04 VITALS — BP 127/80 | HR 74 | Temp 97.7°F | Resp 14 | Ht 69.0 in | Wt 165.0 lb

## 2017-10-04 DIAGNOSIS — E7849 Other hyperlipidemia: Secondary | ICD-10-CM

## 2017-10-04 DIAGNOSIS — I10 Essential (primary) hypertension: Secondary | ICD-10-CM | POA: Insufficient documentation

## 2017-10-04 DIAGNOSIS — Z79899 Other long term (current) drug therapy: Secondary | ICD-10-CM | POA: Insufficient documentation

## 2017-10-04 DIAGNOSIS — R079 Chest pain, unspecified: Secondary | ICD-10-CM

## 2017-10-04 DIAGNOSIS — R9431 Abnormal electrocardiogram [ECG] [EKG]: Secondary | ICD-10-CM

## 2017-10-04 DIAGNOSIS — E119 Type 2 diabetes mellitus without complications: Secondary | ICD-10-CM | POA: Insufficient documentation

## 2017-10-04 DIAGNOSIS — R9389 Abnormal findings on diagnostic imaging of other specified body structures: Secondary | ICD-10-CM

## 2017-10-04 DIAGNOSIS — G629 Polyneuropathy, unspecified: Secondary | ICD-10-CM

## 2017-10-04 DIAGNOSIS — R0789 Other chest pain: Secondary | ICD-10-CM | POA: Insufficient documentation

## 2017-10-04 DIAGNOSIS — Z794 Long term (current) use of insulin: Secondary | ICD-10-CM | POA: Insufficient documentation

## 2017-10-04 DIAGNOSIS — E1165 Type 2 diabetes mellitus with hyperglycemia: Secondary | ICD-10-CM

## 2017-10-04 LAB — POCT URINALYSIS DIP (DEVICE)
Bilirubin Urine: NEGATIVE
Glucose, UA: 500 mg/dL — AB
HGB URINE DIPSTICK: NEGATIVE
Ketones, ur: NEGATIVE mg/dL
Leukocytes, UA: NEGATIVE
Nitrite: NEGATIVE
PROTEIN: NEGATIVE mg/dL
SPECIFIC GRAVITY, URINE: 1.02 (ref 1.005–1.030)
UROBILINOGEN UA: 0.2 mg/dL (ref 0.0–1.0)
pH: 5 (ref 5.0–8.0)

## 2017-10-04 LAB — I-STAT TROPONIN, ED
TROPONIN I, POC: 0.01 ng/mL (ref 0.00–0.08)
TROPONIN I, POC: 0.01 ng/mL (ref 0.00–0.08)

## 2017-10-04 LAB — BASIC METABOLIC PANEL
Anion gap: 7 (ref 5–15)
BUN: 20 mg/dL (ref 6–20)
CHLORIDE: 105 mmol/L (ref 101–111)
CO2: 24 mmol/L (ref 22–32)
CREATININE: 1.03 mg/dL (ref 0.61–1.24)
Calcium: 8.9 mg/dL (ref 8.9–10.3)
GFR calc non Af Amer: >60 mL/min (ref >60)
GLUCOSE: 108 mg/dL — AB (ref 65–99)
Potassium: 3.9 mmol/L (ref 3.5–5.1)
Sodium: 136 mmol/L (ref 135–145)

## 2017-10-04 LAB — CBC
HCT: 41.4 % (ref 39.0–52.0)
HEMOGLOBIN: 14.2 g/dL (ref 13.0–17.0)
MCH: 29.9 pg (ref 26.0–34.0)
MCHC: 34.3 g/dL (ref 30.0–36.0)
MCV: 87.2 fL (ref 78.0–100.0)
Platelets: 240 10*3/uL (ref 150–400)
RBC: 4.75 MIL/uL (ref 4.22–5.81)
RDW: 12 % (ref 11.5–15.5)
WBC: 4.6 10*3/uL (ref 4.0–10.5)

## 2017-10-04 LAB — GLUCOSE, CAPILLARY: Glucose-Capillary: 166 mg/dL — ABNORMAL HIGH (ref 65–99)

## 2017-10-04 MED ORDER — INSULIN GLARGINE 100 UNIT/ML SOLOSTAR PEN: 25.0000 [IU] | PEN_INJECTOR | Freq: Every day | SUBCUTANEOUS | 5 refills | Status: DC

## 2017-10-04 MED ORDER — SIMVASTATIN 20 MG PO TABS: 20.0000 mg | ORAL_TABLET | Freq: Every day | ORAL | 2 refills | Status: DC

## 2017-10-04 MED ORDER — GABAPENTIN 100 MG PO CAPS: 100.0000 mg | ORAL_CAPSULE | Freq: Three times a day (TID) | ORAL | 3 refills | Status: DC

## 2017-10-04 MED ORDER — INSULIN GLARGINE 100 UNIT/ML SOLOSTAR PEN: 25.0000 [IU] | PEN_INJECTOR | Freq: Every day | SUBCUTANEOUS | 99 refills | Status: DC

## 2017-10-04 NOTE — ED Notes (Signed)
Patient given discharge instructions and verbalized understanding.  Patient stable to discharge at this time.  Patient is alert and oriented to baseline.  No distressed noted at this time.  All belongings taken with the patient at discharge.   

## 2017-10-04 NOTE — Progress Notes (Signed)
Subjective:    Dean Robertson is a 56 y.o. male with a history of a cardiac stent placement in 2011 presents complaining of chest pain that is radiating to right arm. Patient says that pain has been worsening over the past several weeks. Patient was evaluated on 09/19/2017, and EKG was done that showed normal sinus rhythm. Onset was several weeks ago.  Symptoms have worsened since that time. The patient describes the pain as pressure, squeezing and radiates to the right arm.  Associated symptoms are: none. Patient has not identified any palliative or provocative factors.Patient's cardiac risk factors are: advanced age (older than 22 for men, 72 for women), diabetes mellitus, dyslipidemia, hypertension and male gender.Previous cardiac testing: electrocardiogram (ECG) Past Medical History:  Diagnosis Date  . Diabetes mellitus without complication ([place])   . Hypertension    Social History   Socioeconomic History  . Marital status: Single    Spouse name: Not on file  . Number of children: Not on file  . Years of education: Not on file  . Highest education level: Not on file  Social Needs  . Financial resource strain: Not on file  . Food insecurity - worry: Not on file  . Food insecurity - inability: Not on file  . Transportation needs - medical: Not on file  . Transportation needs - non-medical: Not on file  Occupational History  . Not on file  Tobacco Use  . Smoking status: Never Smoker  . Smokeless tobacco: Never Used  Substance and Sexual Activity  . Alcohol use: No  . Drug use: No  . Sexual activity: Not on file  Other Topics Concern  . Not on file  Social History Narrative  . Not on file   Review of Systems Review of Systems  HENT: Negative.   Respiratory: Negative.   Cardiovascular: Positive for chest pain and leg swelling. Negative for palpitations and claudication.  Genitourinary: Negative.   Musculoskeletal: Negative.   Skin: Negative.   Psychiatric/Behavioral:  Negative.     Objective:  BP 127/80 (BP Location: Right Arm, Patient Position: Sitting, Cuff Size: Normal)   Pulse 74   Temp 97.7 F (36.5 C) (Oral)   Resp 14   Ht 5\' 9"  (1.753 m)   Wt 165 lb (74.8 kg)   SpO2 100%   BMI 24.37 kg/m   General Appearance:    Alert, cooperative, no distress, appears stated age  Head:    Normocephalic, without obvious abnormality, atraumatic  Eyes:    PERRL, conjunctiva/corneas clear, EOM's intact, fundi    benign, both eyes       Ears:    Normal TM's and external ear canals, both ears  Nose:   Nares normal, septum midline, mucosa normal, no drainage   or sinus tenderness  Throat:   Lips, mucosa, and tongue normal; teeth and gums normal  Neck:   Supple, symmetrical, trachea midline, no adenopathy;       thyroid:  No enlargement/tenderness/nodules; no carotid   bruit or JVD  Back:     Symmetric, no curvature, ROM normal, no CVA tenderness  Lungs:     Clear to auscultation bilaterally, respirations unlabored  Chest wall:    No tenderness or deformity  Heart:    Regular rate and rhythm, S1 and S2 normal, no murmur, rub   or gallop  Abdomen:     Soft, non-tender, bowel sounds active all four quadrants,    no masses, no organomegaly  Extremities:   Extremities normal, atraumatic, no  cyanosis or edema  Pulses:   2+ and symmetric all extremities  Skin:   Skin color, texture, turgor normal, no rashes or lesions  Lymph nodes:   Cervical, supraclavicular, and axillary nodes normal  Neurologic:   CNII-XII intact. Normal strength, sensation and reflexes      throughout   Imaging Chest x-ray: not indicated    Assessment:  BP 127/80 (BP Location: Right Arm, Patient Position: Sitting, Cuff Size: Normal)   Pulse 74   Temp 97.7 F (36.5 C) (Oral)   Resp 14   Ht 5\' 9"  (1.753 m)   Wt 165 lb (74.8 kg)   SpO2 100%   BMI 24.37 kg/m  Plan:  1. Abnormal chest xray Notified 911 for further workup and evaluation.   2. ST elevation Patient transported to  [facility] for further workup and evaluation  3. Uncontrolled type 2 diabetes mellitus with hyperglycemia (HCC) - Insulin Glargine (LANTUS SOLOSTAR) 100 UNIT/ML Solostar Pen; Inject 25 Units into the skin daily at 10 pm.  Dispense: 5 pen; Refill: 5  4. Chest pain, unspecified type - Troponin I  5. Other hyperlipidemia - simvastatin (ZOCOR) 20 MG tablet; Take 1 tablet (20 mg total) by mouth daily at 6 PM.  Dispense: 90 tablet; Refill: 2  6. Neuropathy - gabapentin (NEURONTIN) 100 MG capsule; Take 1 capsule (100 mg total) by mouth 3 (three) times daily.  Dispense: 90 capsule; Refill: 3   Will follow up with patient following hospital discharge.     [name]  MSN, FNP-C [facility] [address] [place], [place] [address] [phone]

## 2017-10-04 NOTE — Progress Notes (Signed)
Patient with complaint of chest pain, 12 lead EKG showing Acute MI, #20G angiocath inserted to R forearm on 1st attempt - NS at [facility] started.  Aspirin 325mg  given orally by Durward Parcel, LPN.  911 called and patient transported to [facility] hospital.

## 2017-10-04 NOTE — Discharge Instructions (Signed)
Please read attached information regarding your condition. Continue your home medications as previously prescribed. Follow-up with your primary care provider and cardiologist for further evaluation. Return to ED for worsening chest pain, shortness of breath, coughing up blood, leg swelling, injuries or falls.

## 2017-10-04 NOTE — ED Provider Notes (Signed)
[facility] EMERGENCY DEPARTMENT Provider Note   CSN: [id number] Arrival date & time: 10/04/17  1703     History   Chief Complaint Chief Complaint  Patient presents with  . Chest Pain    HPI Dean Robertson is a 56 y.o. male with a past medical history of diabetes, HTN, s/p stent placement 3 years ago who presents to ED from PCP office for cardiac workup. He was there for routine checkup when he mentioned his 1 month history of intermittent right-sided chest and arm pain. They completed an EKG which showed ST elevation in V2.   Regarding his chest pain, he states that his intermittent and worse with movement.  He states that when he tries to move his right arm such as when he goes fishing he appearances the sharp pain that lasts for a few minutes at a time.  He denies any pain currently.  He has not tried any medications to help with pain.  He denies any shortness of breath, hemoptysis, leg swelling, fever, cough, URI symptoms, nausea, vomiting or abdominal pain.  HPI  Past Medical History:  Diagnosis Date  . Diabetes mellitus without complication ([place])   . Hypertension     Patient Active Problem List   Diagnosis Date Noted  . Chest pain 10/04/2017    Past Surgical History:  Procedure Laterality Date  . CORONARY ANGIOPLASTY WITH STENT PLACEMENT         Home Medications    Prior to Admission medications   Medication Sig Start Date End Date Taking? Authorizing Provider  artificial tears (LACRILUBE) OINT ophthalmic ointment Place into both eyes every 2 (two) hours while awake. Patient not taking: Reported on 09/19/2017 09/02/16   Duffy Bruce, MD  aspirin EC 81 MG tablet Take 81 mg by mouth daily.    [provider]  Blood Glucose Monitoring Suppl (TRUE METRIX AIR GLUCOSE METER) w/Device KIT 1 each 3 (three) times daily by Does not apply route. 09/19/17   Dorena Dew, FNP  erythromycin ophthalmic ointment Place a 1/2 inch ribbon of  ointment into the lower eyelid of both eyes twice daily for 5 days. Patient not taking: Reported on 09/19/2017 09/02/16   Duffy Bruce, MD  gabapentin (NEURONTIN) 100 MG capsule Take 1 capsule (100 mg total) by mouth 3 (three) times daily. 10/04/17   Dorena Dew, FNP  glucose blood (TRUE METRIX BLOOD GLUCOSE TEST) test strip Use as instructed 09/19/17   Dorena Dew, FNP  HYDROcodone-acetaminophen (NORCO/VICODIN) 5-325 MG tablet Take 2 tablets by mouth every 4 (four) hours as needed. Patient not taking: Reported on 09/19/2017 11/28/16   Fransico Meadow, PA-C  Insulin Glargine (LANTUS SOLOSTAR) 100 UNIT/ML Solostar Pen Inject 25 Units into the skin daily at 10 pm. 10/04/17   Dorena Dew, FNP  Lancets MISC 1 each 3 (three) times daily by Does not apply route. 09/19/17   Dorena Dew, FNP  lisinopril (PRINIVIL,ZESTRIL) 20 MG tablet Take 20 mg by mouth daily.    [provider]  metFORMIN (GLUCOPHAGE) 500 MG tablet Take 2 tablets (1,000 mg total) 2 (two) times daily with a meal by mouth. 09/19/17   Dorena Dew, FNP  simvastatin (ZOCOR) 20 MG tablet Take 1 tablet (20 mg total) by mouth daily at 6 PM. 10/04/17   Dorena Dew, FNP    Family History Family History  Problem Relation Age of Onset  . Hypertension Mother   . Diabetes Father  Social History Social History   Tobacco Use  . Smoking status: Never Smoker  . Smokeless tobacco: Never Used  Substance Use Topics  . Alcohol use: No  . Drug use: No     Allergies   Patient has no known allergies.   Review of Systems Review of Systems  Constitutional: Negative for appetite change, chills and fever.  HENT: Negative for ear pain, rhinorrhea, sneezing and sore throat.   Eyes: Negative for photophobia and visual disturbance.  Respiratory: Negative for cough, chest tightness, shortness of breath and wheezing.   Cardiovascular: Positive for chest pain. Negative for palpitations.    Gastrointestinal: Negative for abdominal pain, blood in stool, constipation, diarrhea, nausea and vomiting.  Genitourinary: Negative for dysuria, hematuria and urgency.  Musculoskeletal: Positive for arthralgias. Negative for myalgias.  Skin: Negative for rash.  Neurological: Negative for dizziness, weakness and light-headedness.     Physical Exam Updated Vital Signs BP 136/84   Pulse 65   Temp 97.6 F (36.4 C) (Oral)   Resp 15   SpO2 97%   Physical Exam  Constitutional: He appears well-developed and well-nourished. No distress.  Nontoxic appearing and in no acute distress.  HENT:  Head: Normocephalic and atraumatic.  Nose: Nose normal.  Eyes: Conjunctivae and EOM are normal. Right eye exhibits no discharge. Left eye exhibits no discharge. No scleral icterus.  Neck: Normal range of motion. Neck supple.  Cardiovascular: Normal rate, regular rhythm, normal heart sounds and intact distal pulses. Exam reveals no gallop and no friction rub.  No murmur heard. Pulmonary/Chest: Effort normal and breath sounds normal. No respiratory distress. He exhibits tenderness.    Abdominal: Soft. Bowel sounds are normal. He exhibits no distension. There is no tenderness. There is no guarding.  Musculoskeletal: Normal range of motion. He exhibits no edema.  Neurological: He is alert. He exhibits normal muscle tone. Coordination normal.  Skin: Skin is warm and dry. No rash noted.  Psychiatric: He has a normal mood and affect.  Nursing note and vitals reviewed.    ED Treatments / Results  Labs (all labs ordered are listed, but only abnormal results are displayed) Labs Reviewed  BASIC METABOLIC PANEL - Abnormal; Notable for the following components:      Result Value   Glucose, Bld 108 (*)    All other components within normal limits  CBC  I-STAT TROPONIN, ED  I-STAT TROPONIN, ED  I-STAT TROPONIN, ED    EKG  EKG Interpretation  Date/Time:  Friday October 04 2017 17:08:09  EST Ventricular Rate:  63 PR Interval:    QRS Duration: 84 QT Interval:  391 QTC Calculation: 401 R Axis:   57 Text Interpretation:  Sinus rhythm Minimal ST elevation, anterior leads suspect early repolarization. no STEMI Confirmed by Charlesetta Shanks [phone]) on 10/04/2017 8:35:26 PM       Radiology Dg Chest 2 View  Result Date: 10/04/2017 CLINICAL DATA:  Chest pain EXAM: CHEST  2 VIEW COMPARISON:  04/01/2015 FINDINGS: Normal heart size and mediastinal contours. Coronary stent. There is no edema, consolidation, effusion, or pneumothorax. No acute osseous finding. IMPRESSION: No evidence of active disease. Electronically Signed   By: Monte Fantasia M.D.   On: 10/04/2017 19:49    Procedures Procedures (including critical care time)  Medications Ordered in ED Medications - No data to display   Initial Impression / Assessment and Plan / ED Course  I have reviewed the triage vital signs and the nursing notes.  Pertinent labs & imaging results that  were available during my care of the patient were reviewed by me and considered in my medical decision making (see chart for details).     Patient presents to ED for evaluation after primary care visit.  He was here for routine checkup with an EKG showed ST elevation in V2.  He was sent here for further workup.  He does report a history of intermittent right-sided chest pain worse with movement for the past month.  He denies any pain currently.  He denies any other associated symptoms including shortness of breath, hemoptysis, leg swelling, fevers or URI symptoms.  He is overall nontoxic-appearing.  He does have tenderness to palpation of the chest and pain with movement of the right arm.  No visible deformity noted.  He is satting at 97-98% on room air.  EKG showed no evidence of STEMI.  Troponin was negative x2 here.  Other lab work including CBC and BMP is unremarkable.  Chest x-ray was negative.  His chest pain seems musculoskeletal in  origin.  There are no other concerning signs on his EKG and his lab work is reassuring.  He states that he feels comfortable with outpatient follow-up.  I did tell him to take Tylenol or ibuprofen as needed for his pain.  Patient appears stable for discharge at this time.  Strict return precautions given.  Patient discussed with and seen by Dr. Johnney Killian.  Final Clinical Impressions(s) / ED Diagnoses   Final diagnoses:  Chest wall pain    ED Discharge Orders    None       Delia Heady, PA-C 10/04/17 2316    Charlesetta Shanks, MD 10/06/17 1909

## 2017-10-04 NOTE — ED Notes (Signed)
Urine sample collected and at bedside.

## 2017-10-04 NOTE — ED Notes (Signed)
Pt left for Xray.

## 2017-10-04 NOTE — ED Triage Notes (Signed)
Pt arrives via [facility] with reports of cp. Pt was at MD office for routine checkup. Pt EKG showed ST elevation in V2 so pt sent here for cardiac workup. CP 1/10 upon arrival to ED. Pt with hx cardiac stents in 2011. VSS with EMS.

## 2017-10-10 ENCOUNTER — Other Ambulatory Visit: Payer: Self-pay | Admitting: *Deleted

## 2017-10-10 DIAGNOSIS — E1165 Type 2 diabetes mellitus with hyperglycemia: Secondary | ICD-10-CM

## 2017-10-10 MED ORDER — INSULIN GLARGINE 100 UNIT/ML SOLOSTAR PEN: 25.0000 [IU] | PEN_INJECTOR | Freq: Every day | SUBCUTANEOUS | 3 refills | Status: DC

## 2017-10-21 MED FILL — TRUEplus LANCETS 28G MISC: 30 days supply | Qty: 100 | Fill #1

## 2017-10-21 MED FILL — !LANTUS SOLOSTAR 100UNITS/M: 100 | 24 days supply | Qty: 6 | Fill #0

## 2017-10-21 MED FILL — SIMVASTATIN 20 MG TABLET: 20 | 30 days supply | Qty: 30 | Fill #1

## 2017-10-21 MED FILL — TRUE METRIX TEST STRIP: 30 days supply | Qty: 100 | Fill #1

## 2017-10-21 MED FILL — GABAPENTIN 100 MG CAPSULE: 100 | 30 days supply | Qty: 90 | Fill #0

## 2017-10-21 MED FILL — ?METFORMIN HCL 500MG TABLET: 500 | 30 days supply | Qty: 120 | Fill #1

## 2017-12-04 ENCOUNTER — Ambulatory Visit (INDEPENDENT_AMBULATORY_CARE_PROVIDER_SITE_OTHER): Payer: Self-pay | Admitting: Family Medicine

## 2017-12-04 ENCOUNTER — Encounter: Payer: Self-pay | Admitting: Family Medicine

## 2017-12-04 VITALS — BP 124/81 | HR 75 | Temp 97.6°F | Resp 16 | Ht 69.0 in | Wt 173.0 lb

## 2017-12-04 DIAGNOSIS — Z794 Long term (current) use of insulin: Secondary | ICD-10-CM

## 2017-12-04 DIAGNOSIS — E119 Type 2 diabetes mellitus without complications: Secondary | ICD-10-CM

## 2017-12-04 DIAGNOSIS — G44209 Tension-type headache, unspecified, not intractable: Secondary | ICD-10-CM

## 2017-12-04 DIAGNOSIS — I10 Essential (primary) hypertension: Secondary | ICD-10-CM

## 2017-12-04 LAB — POCT URINALYSIS DIP (DEVICE)
Bilirubin Urine: NEGATIVE
GLUCOSE, UA: NEGATIVE mg/dL
HGB URINE DIPSTICK: NEGATIVE
Ketones, ur: NEGATIVE mg/dL
LEUKOCYTES UA: NEGATIVE
NITRITE: NEGATIVE
PROTEIN: NEGATIVE mg/dL
SPECIFIC GRAVITY, URINE: 1.01 (ref 1.005–1.030)
UROBILINOGEN UA: 0.2 mg/dL (ref 0.0–1.0)
pH: 5.5 (ref 5.0–8.0)

## 2017-12-04 LAB — GLUCOSE, CAPILLARY: Glucose-Capillary: 94 mg/dL (ref 65–99)

## 2017-12-04 LAB — POCT GLYCOSYLATED HEMOGLOBIN (HGB A1C): HEMOGLOBIN A1C: 9.7

## 2017-12-04 MED ORDER — METFORMIN HCL 1000 MG PO TABS: 1000.0000 mg | ORAL_TABLET | Freq: Two times a day (BID) | ORAL | 5 refills | Status: DC

## 2017-12-04 MED ORDER — KETOROLAC TROMETHAMINE 30 MG/ML IJ SOLN
30.0000 mg | Freq: Once | INTRAMUSCULAR | Status: AC
  Administered 2017-12-04: 30 mg via INTRAMUSCULAR

## 2017-12-04 NOTE — Progress Notes (Signed)
Subjective:    Patient ID: Dean Robertson, male    DOB: 19-Aug-1961, 57 y.o.   MRN: [id number]  HPI Dean Robertson, a 57 year old male with a history of type 2 diabetes mellitus and hypertension presents for a follow up of chronic conditions. Patients says that he has been taking medications consistently. He does not exercise routinely. He attempts to follow a carbohydrate modified diet. Most recent hemoglobin a1C was 12.8. He mostly prepares meals himself at home, and refrains from eating fast food.  He denies blurred vision, dizziness, nausea, vomiting, diarrhea, polydipsia or polyphagia. Patient endorses periodic headache pain. Pain is characterized as intermittent and aching. Current pain intensity is 3-4/10. He has not attempted any OTC analgesics to alleviate symptoms.  Patient also has a history of hypertension.  Blood pressures have been mostly controlled.  He does not follow a low-fat, low-sodium diet or exercise routinely.  He denies chest pains, heart palpitations, syncope, shortness of breath, or bilateral lower extremity edema.  Patient's cardiac risk factors include dyslipidemia and former smoker.  Past Medical History:  Diagnosis Date  . Diabetes mellitus without complication ([place])   . Hypertension    Social History   Socioeconomic History  . Marital status: Single    Spouse name: Not on file  . Number of children: Not on file  . Years of education: Not on file  . Highest education level: Not on file  Social Needs  . Financial resource strain: Not on file  . Food insecurity - worry: Not on file  . Food insecurity - inability: Not on file  . Transportation needs - medical: Not on file  . Transportation needs - non-medical: Not on file  Occupational History  . Not on file  Tobacco Use  . Smoking status: Never Smoker  . Smokeless tobacco: Never Used  Substance and Sexual Activity  . Alcohol use: No  . Drug use: No  . Sexual activity: Not on file  Other Topics Concern   . Not on file  Social History Narrative  . Not on file   Immunization History  Administered Date(s) Administered  . Influenza,inj,Quad PF,6+ Mos 09/19/2017  . Pneumococcal Polysaccharide-23 09/19/2017   No Known Allergies  Review of Systems  Constitutional: Negative.   HENT: Negative.   Eyes: Negative.   Respiratory: Negative.   Cardiovascular: Negative.   Gastrointestinal: Negative.   Endocrine: Negative for polydipsia, polyphagia and polyuria.  Musculoskeletal: Negative.   Skin: Negative.   Allergic/Immunologic: Negative for immunocompromised state.  Neurological: Positive for numbness (Lower extremities) and headaches.  Hematological: Negative.   Psychiatric/Behavioral: Negative.        Objective:   Physical Exam  Constitutional: He is oriented to person, place, and time.  HENT:  Head: Normocephalic and atraumatic.  Right Ear: External ear normal.  Left Ear: External ear normal.  Nose: Nose normal.  Mouth/Throat: Oropharynx is clear and moist.  Eyes: Pupils are equal, round, and reactive to light.  Neck: Normal range of motion. Neck supple.  Cardiovascular: Normal rate, regular rhythm, normal heart sounds and intact distal pulses.  Pulmonary/Chest: Effort normal and breath sounds normal.  Abdominal: Soft. Bowel sounds are normal.  Neurological: He is alert and oriented to person, place, and time. He has normal reflexes. He displays tremor. Coordination and gait normal.  Fine tremor  Skin: Skin is warm and dry.  Psychiatric: He has a normal mood and affect. His behavior is normal. Judgment and thought content normal.      BP 124/81 (  BP Location: Left Arm, Patient Position: Sitting, Cuff Size: Large)   Pulse 75   Temp 97.6 F (36.4 C) (Oral)   Resp 16   Ht 5\' 9"  (1.753 m)   Wt 173 lb (78.5 kg)   SpO2 100%   BMI 25.55 kg/m  Assessment & Plan:  1. Type 2 diabetes mellitus without complication, with long-term current use of insulin (HCC) Hemoglobin a1C has  improved on current medication regimen. However, continues to remain above goal, which is greater than 7. Will increase  Lantus to 25 units HS and continue metformin at current dosages.  Discussed the importance of following a carbohydrate modified diet.  Advised to bring glucometer to follow up appointment.  - HgB A1c - Glucose, capillary - POCT urinalysis dip (device) - metFORMIN (GLUCOPHAGE) 1000 MG tablet; Take 1 tablet (1,000 mg total) by mouth 2 (two) times daily with a meal.  Dispense: 60 tablet; Refill: 5  2. Essential hypertension Blood pressure is controlled on current medication regimen, no medication changes warranted on today.  - lisinopril (PRINIVIL,ZESTRIL) 20 MG tablet; Take 1 tablet (20 mg total) by mouth daily.  Dispense: 30 tablet; Refill: 5  3. Tension-type headache, not intractable, unspecified chronicity pattern - ketorolac (TORADOL) 30 MG/ML injection 30 mg - acetaminophen (TYLENOL) 500 MG tablet; Take 1 tablet (500 mg total) by mouth every 6 (six) hours as needed.  Dispense: 30 tablet; Refill: 0    RTC: 3 months for type 2 DM and hypertension   [name]  MSN, FNP-C Patient [facility] [address] [place], [place] [address] [phone]

## 2017-12-04 NOTE — Patient Instructions (Addendum)
Hemoglobin A1c has improved to 9.7.  Will continue Lantus at 25 bedtime and metformin 1000 mg twice daily.  Also continue to follow a carbohydrate modify diet divided over 5-6 small meals throughout the day.  Increase water intake to 6-8 glasses throughout the day.   Blood pressure is at goal on current medication regimen, no changes warranted on today.    For tension headaches recommend over-the-counter Tylenol 500 mg as directed.

## 2017-12-08 DIAGNOSIS — I10 Essential (primary) hypertension: Secondary | ICD-10-CM | POA: Insufficient documentation

## 2017-12-08 DIAGNOSIS — E119 Type 2 diabetes mellitus without complications: Secondary | ICD-10-CM | POA: Insufficient documentation

## 2017-12-08 DIAGNOSIS — Z794 Long term (current) use of insulin: Principal | ICD-10-CM

## 2017-12-08 MED ORDER — ACETAMINOPHEN 500 MG PO TABS: 500.0000 mg | ORAL_TABLET | Freq: Four times a day (QID) | ORAL | 0 refills | Status: AC | PRN

## 2017-12-08 MED ORDER — LISINOPRIL 20 MG PO TABS: 20.0000 mg | ORAL_TABLET | Freq: Every day | ORAL | 5 refills | Status: DC

## 2017-12-09 MED FILL — LISINOPRIL 20 MG TAB: 20 | 30 days supply | Qty: 30 | Fill #0

## 2017-12-10 MED FILL — ?METFORMIN HCL 500MG TABLET: 500 | 30 days supply | Qty: 120 | Fill #2

## 2017-12-13 MED FILL — $LANTUS SOLOSTAR 100 UNITS/: 100 | 24 days supply | Qty: 6 | Fill #1

## 2018-01-11 ENCOUNTER — Encounter (HOSPITAL_BASED_OUTPATIENT_CLINIC_OR_DEPARTMENT_OTHER): Payer: Self-pay | Admitting: Emergency Medicine

## 2018-01-11 ENCOUNTER — Other Ambulatory Visit: Payer: Self-pay

## 2018-01-11 ENCOUNTER — Emergency Department (HOSPITAL_BASED_OUTPATIENT_CLINIC_OR_DEPARTMENT_OTHER)
Admission: EM | Admit: 2018-01-11 | Discharge: 2018-01-11 | Disposition: A | Discharge status: Home / Self Care | Payer: Commercial Managed Care - PPO | Attending: Emergency Medicine | Admitting: Emergency Medicine

## 2018-01-11 DIAGNOSIS — E119 Type 2 diabetes mellitus without complications: Secondary | ICD-10-CM | POA: Insufficient documentation

## 2018-01-11 DIAGNOSIS — E7800 Pure hypercholesterolemia, unspecified: Secondary | ICD-10-CM | POA: Diagnosis not present

## 2018-01-11 DIAGNOSIS — G250 Essential tremor: Secondary | ICD-10-CM | POA: Insufficient documentation

## 2018-01-11 DIAGNOSIS — Z7982 Long term (current) use of aspirin: Secondary | ICD-10-CM | POA: Diagnosis not present

## 2018-01-11 DIAGNOSIS — I2510 Atherosclerotic heart disease of native coronary artery without angina pectoris: Secondary | ICD-10-CM | POA: Diagnosis not present

## 2018-01-11 DIAGNOSIS — Z794 Long term (current) use of insulin: Secondary | ICD-10-CM | POA: Insufficient documentation

## 2018-01-11 DIAGNOSIS — R251 Tremor, unspecified: Secondary | ICD-10-CM | POA: Diagnosis present

## 2018-01-11 DIAGNOSIS — I10 Essential (primary) hypertension: Secondary | ICD-10-CM | POA: Diagnosis not present

## 2018-01-11 DIAGNOSIS — Z79899 Other long term (current) drug therapy: Secondary | ICD-10-CM | POA: Insufficient documentation

## 2018-01-11 HISTORY — DX: Pure hypercholesterolemia, unspecified: E78.00

## 2018-01-11 HISTORY — DX: Atherosclerotic heart disease of native coronary artery without angina pectoris: I25.10

## 2018-01-11 LAB — CBC WITH DIFFERENTIAL/PLATELET
BASOS ABS: 0 10*3/uL (ref 0.0–0.1)
Basophils Relative: 1 %
EOS ABS: 0.1 10*3/uL (ref 0.0–0.7)
EOS PCT: 2 %
HCT: 41.4 % (ref 39.0–52.0)
Hemoglobin: 14.1 g/dL (ref 13.0–17.0)
LYMPHS PCT: 32 %
Lymphs Abs: 1.4 10*3/uL (ref 0.7–4.0)
MCH: 29.2 pg (ref 26.0–34.0)
MCHC: 34.1 g/dL (ref 30.0–36.0)
MCV: 85.7 fL (ref 78.0–100.0)
Monocytes Absolute: 0.3 10*3/uL (ref 0.1–1.0)
Monocytes Relative: 7 %
Neutro Abs: 2.5 10*3/uL (ref 1.7–7.7)
Neutrophils Relative %: 58 %
PLATELETS: 245 10*3/uL (ref 150–400)
RBC: 4.83 MIL/uL (ref 4.22–5.81)
RDW: 11.7 % (ref 11.5–15.5)
WBC: 4.3 10*3/uL (ref 4.0–10.5)

## 2018-01-11 LAB — BASIC METABOLIC PANEL
Anion gap: 8 (ref 5–15)
BUN: 24 mg/dL — AB (ref 6–20)
CALCIUM: 8.9 mg/dL (ref 8.9–10.3)
CO2: 26 mmol/L (ref 22–32)
CREATININE: 1.08 mg/dL (ref 0.61–1.24)
Chloride: 104 mmol/L (ref 101–111)
GFR calc Af Amer: >60 mL/min (ref >60)
GLUCOSE: 147 mg/dL — AB (ref 65–99)
Potassium: 3.9 mmol/L (ref 3.5–5.1)
SODIUM: 138 mmol/L (ref 135–145)

## 2018-01-11 LAB — TSH: TSH: 0.762 u[IU]/mL (ref 0.350–4.500)

## 2018-01-11 LAB — MAGNESIUM: MAGNESIUM: 2.1 mg/dL (ref 1.7–2.4)

## 2018-01-11 LAB — CBG MONITORING, ED: GLUCOSE-CAPILLARY: 194 mg/dL — AB (ref 65–99)

## 2018-01-11 MED ORDER — PROPRANOLOL HCL 40 MG PO TABS: 40.0000 mg | ORAL_TABLET | Freq: Two times a day (BID) | ORAL | 0 refills | Status: DC

## 2018-01-11 NOTE — ED Triage Notes (Addendum)
Pt states his arms and voice have been shaking today. Pt states he thought it was his nerves. Denies pain. No hx of anxiety. Pt hands noted to be trembling.

## 2018-01-11 NOTE — ED Provider Notes (Addendum)
[facility] EMERGENCY DEPARTMENT Provider Note   CSN: [id number] Arrival date & time: 01/11/18  1406     History   Chief Complaint Chief Complaint  Patient presents with  . Shaking    HPI Dean Robertson is a 57 y.o. male.  HPI  57 year old male with a history of diabetes, hypertension, hyperlipidemia who presents with tremor.  He states he has had tremor in his hands for about 3 or 4 months but it is much more severe over the last 24 hours.  Last night he noticed significant tremor when trying to give himself insulin.  He only notices these tremors with action.  At rest there is no tremor.  He states he is also noticed a tremor in his voice but he states it is not stuttering.  No trouble breathing.  No headache, dizziness, chest pain, shortness of breath, vomiting/diarrhea, or abdominal pain.  There is no weakness or numbness.  No lower extremity symptoms.  Denies any recent change in his medications.   he drinks 1 glass of wine rarely but otherwise no alcohol use and no illicit drug use.  Past Medical History:  Diagnosis Date  . Coronary artery disease   . Diabetes mellitus without complication ([place])   . High cholesterol   . Hypertension     Patient Active Problem List   Diagnosis Date Noted  . Essential hypertension 12/08/2017  . Type 2 diabetes mellitus without complication, with long-term current use of insulin ([place]) 12/08/2017  . Chest pain 10/04/2017    Past Surgical History:  Procedure Laterality Date  . CORONARY ANGIOPLASTY WITH STENT PLACEMENT         Home Medications    Prior to Admission medications   Medication Sig Start Date End Date Taking? Authorizing Provider  acetaminophen (TYLENOL) 500 MG tablet Take 1 tablet (500 mg total) by mouth every 6 (six) hours as needed. 12/08/17   Dorena Dew, FNP  aspirin EC 81 MG tablet Take 81 mg by mouth daily.    [provider]  Blood Glucose Monitoring Suppl (TRUE METRIX AIR GLUCOSE METER)  w/Device KIT 1 each 3 (three) times daily by Does not apply route. 09/19/17   Dorena Dew, FNP  gabapentin (NEURONTIN) 100 MG capsule Take 1 capsule (100 mg total) by mouth 3 (three) times daily. 10/04/17   Dorena Dew, FNP  glucose blood (TRUE METRIX BLOOD GLUCOSE TEST) test strip Use as instructed 09/19/17   Dorena Dew, FNP  Insulin Glargine (LANTUS SOLOSTAR) 100 UNIT/ML Solostar Pen Inject 25 Units into the skin daily at 10 pm. 10/10/17   Tresa Garter, MD  Lancets MISC 1 each 3 (three) times daily by Does not apply route. 09/19/17   Dorena Dew, FNP  lisinopril (PRINIVIL,ZESTRIL) 20 MG tablet Take 1 tablet (20 mg total) by mouth daily. 12/08/17   Dorena Dew, FNP  metFORMIN (GLUCOPHAGE) 1000 MG tablet Take 1 tablet (1,000 mg total) by mouth 2 (two) times daily with a meal. 12/04/17   Dorena Dew, FNP  propranolol (INDERAL) 40 MG tablet Take 1 tablet (40 mg total) by mouth 2 (two) times daily. 01/11/18   Sherwood Gambler, MD  simvastatin (ZOCOR) 20 MG tablet Take 1 tablet (20 mg total) by mouth daily at 6 PM. 10/04/17   Dorena Dew, FNP    Family History Family History  Problem Relation Age of Onset  . Hypertension Mother   . Diabetes Father     Social  History Social History   Tobacco Use  . Smoking status: Never Smoker  . Smokeless tobacco: Never Used  Substance Use Topics  . Alcohol use: No  . Drug use: No     Allergies   Patient has no known allergies.   Review of Systems Review of Systems  Constitutional: Negative for fever.  HENT: Positive for voice change.   Respiratory: Negative for shortness of breath.   Cardiovascular: Negative for chest pain.  Gastrointestinal: Negative for abdominal pain, diarrhea and vomiting.  Neurological: Positive for tremors. Negative for dizziness, weakness, numbness and headaches.  All other systems reviewed and are negative.    Physical Exam Updated Vital Signs BP 137/89 (BP Location:  Left Arm)   Pulse (!) 101   Temp 98.6 F (37 C) (Oral)   Resp 18   Ht 5' 8"  (1.727 m)   Wt 78.5 kg (173 lb)   SpO2 98%   BMI 26.30 kg/m   Physical Exam  Constitutional: He is oriented to person, place, and time. He appears well-developed and well-nourished.  HENT:  Head: Normocephalic and atraumatic.  Right Ear: External ear normal.  Left Ear: External ear normal.  Nose: Nose normal.  Eyes: Right eye exhibits no discharge. Left eye exhibits no discharge.  Neck: Neck supple.  Cardiovascular: Normal rate, regular rhythm and normal heart sounds.  Pulmonary/Chest: Effort normal and breath sounds normal.  Abdominal: Soft. There is no tenderness.  Musculoskeletal: He exhibits no edema.  Neurological: He is alert and oriented to person, place, and time. He displays tremor.  CN 3-12 grossly intact. 5/5 strength in all 4 extremities. Grossly normal sensation. Normal finger to nose. With finger to nose the tremor is most prominent.  Skin: Skin is warm and dry.  Nursing note and vitals reviewed.    ED Treatments / Results  Labs (all labs ordered are listed, but only abnormal results are displayed) Labs Reviewed  BASIC METABOLIC PANEL - Abnormal; Notable for the following components:      Result Value   Glucose, Bld 147 (*)    BUN 24 (*)    All other components within normal limits  CBG MONITORING, ED - Abnormal; Notable for the following components:   Glucose-Capillary 194 (*)    All other components within normal limits  CBC WITH DIFFERENTIAL/PLATELET  MAGNESIUM  TSH    EKG  EKG Interpretation  Date/Time:  Saturday January 11 2018 14:15:02 EST Ventricular Rate:  96 PR Interval:  134 QRS Duration: 78 QT Interval:  338 QTC Calculation: 427 R Axis:   103 Text Interpretation:  Normal sinus rhythm Rightward axis Abnormal QRS-T angle, consider primary T wave abnormality Abnormal ECG Confirmed by Fredia Sorrow [phone] on 01/11/2018 2:41:01 PM       Radiology No results  found.  Procedures Procedures (including critical care time)  Medications Ordered in ED Medications - No data to display   Initial Impression / Assessment and Plan / ED Course  I have reviewed the triage vital signs and the nursing notes.  Pertinent labs & imaging results that were available during my care of the patient were reviewed by me and considered in my medical decision making (see chart for details).     Patient overall appears well.  Has a tremor only with significant action such as finger to nose.  He does not have a significant alcohol history.  His blood pressure and heart rate seem amenable to trying propranolol.  This is seems consistent with essential tremor.  There is no significant electrolyte abnormality that would cause it.  TSH sent as part of workup but will need to be followed up by PCP as this is not come back in a reasonable time for the ER here.  Discharge home with return precautions. Notes tremor in voice but this is not noted on exam and no difficulty breathing/speaking. Neuro exam otherwise benign.  Final Clinical Impressions(s) / ED Diagnoses   Final diagnoses:  Essential tremor    ED Discharge Orders        Ordered    propranolol (INDERAL) 40 MG tablet  2 times daily     01/11/18 1627       Sherwood Gambler, MD 01/11/18 1629    Sherwood Gambler, MD 01/11/18 [phone]

## 2018-01-11 NOTE — Discharge Instructions (Signed)
The medicine prescribed to you, propranolol, can cause dizziness or lightheadedness.  If he explained the symptoms, stop the medicine and call your doctor.  If you develop weakness or numbness in your arms or legs or a severe headache, return to the ER for evaluation.

## 2018-03-03 ENCOUNTER — Other Ambulatory Visit: Payer: Commercial Managed Care - PPO

## 2018-03-04 ENCOUNTER — Other Ambulatory Visit: Payer: Self-pay

## 2018-03-05 ENCOUNTER — Other Ambulatory Visit: Payer: Commercial Managed Care - PPO

## 2018-03-05 DIAGNOSIS — I10 Essential (primary) hypertension: Secondary | ICD-10-CM

## 2018-03-06 ENCOUNTER — Other Ambulatory Visit: Payer: Self-pay | Admitting: Family Medicine

## 2018-03-06 ENCOUNTER — Telehealth: Payer: Self-pay

## 2018-03-06 DIAGNOSIS — E7849 Other hyperlipidemia: Secondary | ICD-10-CM

## 2018-03-06 LAB — LIPID PANEL
CHOLESTEROL TOTAL: 237 mg/dL — AB (ref 100–199)
Chol/HDL Ratio: 4.1 ratio (ref 0.0–5.0)
HDL: 58 mg/dL (ref >39)
LDL CALC: 147 mg/dL — AB (ref 0–99)
Triglycerides: 161 mg/dL — ABNORMAL HIGH (ref 0–149)
VLDL CHOLESTEROL CAL: 32 mg/dL (ref 5–40)

## 2018-03-06 LAB — COMPREHENSIVE METABOLIC PANEL
ALBUMIN: 4.5 g/dL (ref 3.5–5.5)
ALK PHOS: 59 IU/L (ref 39–117)
ALT: 25 IU/L (ref 0–44)
AST: 20 IU/L (ref 0–40)
Albumin/Globulin Ratio: 1.9 (ref 1.2–2.2)
BILIRUBIN TOTAL: 0.4 mg/dL (ref 0.0–1.2)
BUN / CREAT RATIO: 22 — AB (ref 9–20)
BUN: 26 mg/dL — AB (ref 6–24)
CHLORIDE: 102 mmol/L (ref 96–106)
CO2: 20 mmol/L (ref 20–29)
Calcium: 9.7 mg/dL (ref 8.7–10.2)
Creatinine, Ser: 1.2 mg/dL (ref 0.76–1.27)
GFR calc non Af Amer: 67 mL/min/{1.73_m2} (ref >59)
GFR, EST AFRICAN AMERICAN: 77 mL/min/{1.73_m2} (ref >59)
GLOBULIN, TOTAL: 2.4 g/dL (ref 1.5–4.5)
Glucose: 225 mg/dL — ABNORMAL HIGH (ref 65–99)
Potassium: 4.7 mmol/L (ref 3.5–5.2)
SODIUM: 138 mmol/L (ref 134–144)
TOTAL PROTEIN: 6.9 g/dL (ref 6.0–8.5)

## 2018-03-06 MED ORDER — SIMVASTATIN 20 MG PO TABS: 20.0000 mg | ORAL_TABLET | Freq: Every day | ORAL | 2 refills | Status: DC

## 2018-03-06 NOTE — Telephone Encounter (Signed)
Called and spoke with patient, informed of cholesterol being above goal and that we recommend a low fat/ low carb diet over 5 to 6 small meals daily. Asked to increase water daily to 64 ounces and to exercise 150 minutes weekly of cardio. Patient verbalized understanding and was asked to keep next scheduled appointment. Thanks!

## 2018-03-06 NOTE — Telephone Encounter (Signed)
-----   Message from Dorena Dew, [place] sent at 03/06/2018  1:21 PM EDT ----- Regarding: lab results Please inform patient that total cholesterol was 237, which is above goal.  Goal is less than 200.  Also, LDL cholesterol is 147 which is above goal, goal is less than 70.  Recommend a low-fat, low carbohydrate diet divided over 5-6 small meals throughout the day, increase water intake to 64 ounces and 150 minutes of cardiovascular activity daily.  Follow-up in office as scheduled  [name]  MSN, FNP-C [facility] [address] [place], [place] [address] [phone]

## 2018-03-06 NOTE — Progress Notes (Signed)
Meds ordered this encounter  Medications  . simvastatin (ZOCOR) 20 MG tablet    Sig: Take 1 tablet (20 mg total) by mouth daily at 6 PM.    Dispense:  90 tablet    Refill:  2    [name]  MSN, FNP-C Patient [facility] [address] [place], [place] [address] [phone]

## 2018-03-13 MED FILL — LISINOPRIL 20 MG TAB: 20 | 30 days supply | Qty: 30 | Fill #1

## 2018-03-13 MED FILL — GABAPENTIN 100 MG CAPSULE: 100 | 30 days supply | Qty: 90 | Fill #1

## 2018-03-13 MED FILL — $LANTUS SOLOSTAR 100 UNITS/: 100 | 24 days supply | Qty: 6 | Fill #2

## 2018-04-03 ENCOUNTER — Ambulatory Visit: Payer: Self-pay | Admitting: Family Medicine

## 2018-04-29 MED FILL — LISINOPRIL 20 MG TAB: 20 | 30 days supply | Qty: 30 | Fill #2

## 2018-04-29 MED FILL — metFORMIN HCL 1000 MG TABS: 1000 | 30 days supply | Qty: 60 | Fill #0

## 2018-04-29 MED FILL — GABAPENTIN 100 MG CAPSULE: 100 | 30 days supply | Qty: 90 | Fill #2

## 2018-04-29 MED FILL — $LANTUS SOLOSTAR 100 UNITS/: 100 | 24 days supply | Qty: 6 | Fill #3

## 2018-04-29 MED FILL — SIMVASTATIN 20 MG TABLET: 20 | 30 days supply | Qty: 30 | Fill #0

## 2018-05-16 ENCOUNTER — Ambulatory Visit: Payer: Commercial Managed Care - PPO | Admitting: Family Medicine

## 2018-05-19 ENCOUNTER — Encounter: Payer: Self-pay | Admitting: Family Medicine

## 2018-05-19 ENCOUNTER — Ambulatory Visit (INDEPENDENT_AMBULATORY_CARE_PROVIDER_SITE_OTHER): Payer: Commercial Managed Care - PPO | Admitting: Family Medicine

## 2018-05-19 VITALS — BP 141/81 | HR 73 | Temp 97.6°F | Resp 14 | Ht 69.0 in | Wt 170.0 lb

## 2018-05-19 DIAGNOSIS — E119 Type 2 diabetes mellitus without complications: Secondary | ICD-10-CM | POA: Diagnosis not present

## 2018-05-19 DIAGNOSIS — Z794 Long term (current) use of insulin: Secondary | ICD-10-CM | POA: Diagnosis not present

## 2018-05-19 DIAGNOSIS — E1165 Type 2 diabetes mellitus with hyperglycemia: Secondary | ICD-10-CM | POA: Diagnosis not present

## 2018-05-19 DIAGNOSIS — G629 Polyneuropathy, unspecified: Secondary | ICD-10-CM

## 2018-05-19 DIAGNOSIS — I10 Essential (primary) hypertension: Secondary | ICD-10-CM | POA: Diagnosis not present

## 2018-05-19 DIAGNOSIS — M25511 Pain in right shoulder: Secondary | ICD-10-CM

## 2018-05-19 DIAGNOSIS — R413 Other amnesia: Secondary | ICD-10-CM

## 2018-05-19 DIAGNOSIS — E7849 Other hyperlipidemia: Secondary | ICD-10-CM

## 2018-05-19 DIAGNOSIS — E569 Vitamin deficiency, unspecified: Secondary | ICD-10-CM

## 2018-05-19 LAB — GLUCOSE, POCT (MANUAL RESULT ENTRY): POC Glucose: 214 mg/dl — AB (ref 70–99)

## 2018-05-19 LAB — POCT URINALYSIS DIPSTICK
Bilirubin, UA: NEGATIVE
Blood, UA: NEGATIVE
Glucose, UA: NEGATIVE
Leukocytes, UA: NEGATIVE
Nitrite, UA: NEGATIVE
Protein, UA: POSITIVE — AB
Spec Grav, UA: >=1.03 — AB (ref 1.010–1.025)
Urobilinogen, UA: 0.2 E.U./dL
pH, UA: 5.5 (ref 5.0–8.0)

## 2018-05-19 LAB — POCT GLYCOSYLATED HEMOGLOBIN (HGB A1C): Hemoglobin A1C: 9.8 % — AB (ref 4.0–5.6)

## 2018-05-19 MED ORDER — INSULIN GLARGINE 100 UNIT/ML SOLOSTAR PEN: 50.0000 [IU] | PEN_INJECTOR | Freq: Every day | SUBCUTANEOUS | 3 refills | Status: DC

## 2018-05-19 MED ORDER — METFORMIN HCL 1000 MG PO TABS: 1000.0000 mg | ORAL_TABLET | Freq: Two times a day (BID) | ORAL | 5 refills | Status: DC

## 2018-05-19 MED ORDER — MENTHOL (TOPICAL ANALGESIC) 4 % EX GEL: 1.0000 "application " | Freq: Four times a day (QID) | CUTANEOUS | 2 refills | Status: DC | PRN

## 2018-05-19 MED ORDER — GLUCOSE BLOOD VI STRP: ORAL_STRIP | 12 refills | Status: DC

## 2018-05-19 MED ORDER — GABAPENTIN 100 MG PO CAPS
100.0000 mg | ORAL_CAPSULE | Freq: Three times a day (TID) | ORAL | 3 refills | Status: DC
Start: 2018-05-19 — End: 2018-10-09

## 2018-05-19 MED ORDER — SIMVASTATIN 20 MG PO TABS: 20.0000 mg | ORAL_TABLET | Freq: Every day | ORAL | 2 refills | Status: DC

## 2018-05-19 MED ORDER — LISINOPRIL 20 MG PO TABS: 20.0000 mg | ORAL_TABLET | Freq: Every day | ORAL | 5 refills | Status: DC

## 2018-05-19 NOTE — Progress Notes (Signed)
Subjective:    Patient here for follow-up of elevated blood pressure.  He is exercising and is not adherent to a low-salt diet.  Blood pressure is well controlled at home. Cardiac symptoms: none. Patient denies: chest pain, dyspnea, exertional chest pressure/discomfort, fatigue, lower extremity edema and palpitations. Cardiovascular risk factors: advanced age (older than 71 for men, 68 for women), diabetes mellitus, dyslipidemia, hypertension and male gender. Use of agents associated with hypertension: none. History of target organ damage: none. Patient states that he is "forgetful". Patient states that he is having to write down things that he used to be able to remember. States that this has been for several months. Denies head injury.  Patient also states that he is having right sided shoulder pain for several months. Patient states that he was given an unknown patch by a friend. States that the patch gave him pain and numbness down the right side of his body. Resolved the next day after removing the patch.    The following portions of the patient's history were reviewed and updated as appropriate: allergies, current medications, past family history, past medical history, past social history, past surgical history and problem list.  Review of Systems Pertinent items noted in HPI and remainder of comprehensive ROS otherwise negative.     Objective:    General appearance: alert and no distress Head: Normocephalic, without obvious abnormality, atraumatic Eyes: negative findings: lids and lashes normal, conjunctivae and sclerae normal, pupils equal, round, reactive to light and accomodation and no diabetic or hypertensive changes visualized Lungs: wheezes none Heart: regular rate and rhythm, S1, S2 normal, no murmur, click, rub or gallop Abdomen: soft, non-tender; bowel sounds normal; no masses,  no organomegaly Extremities: extremities normal, atraumatic, no cyanosis or edema Pulses: 2+ and  symmetric Skin: Skin color, texture, turgor normal. No rashes or lesions Neurologic: Alert and oriented X 3, normal strength and tone. Normal symmetric reflexes. Normal coordination and gait Mental status: Alert, oriented, thought content appropriate Reflexes: 2+ and symmetric Gait: Normal .  ASSESSMENT AND PLAN 1. Essential hypertension Low sodium diet and exercise 30 minutes daily.  - Urinalysis Dipstick - CBC with Differential; Future - Comprehensive metabolic panel; Future - Lipid Panel; Future - lisinopril (PRINIVIL,ZESTRIL) 20 MG tablet; Take 1 tablet (20 mg total) by mouth daily.  Dispense: 30 tablet; Refill: 5 - Microalbumin / creatinine urine ratio; Future  2. Type 2 diabetes mellitus without complication, with long-term current use of insulin (HCC)  - HgB A1c- 9.8 in office today. Increased Lantus to 50 units QHS. Goal: FBG 150 or less.  - Glucose (CBG) - Insulin Glargine (LANTUS SOLOSTAR) 100 UNIT/ML Solostar Pen; Inject 50 Units into the skin at bedtime.  Dispense: 15 mL; Refill: 3 - glucose blood (TRUE METRIX BLOOD GLUCOSE TEST) test strip; Use as instructed  Dispense: 100 each; Refill: 12 - metFORMIN (GLUCOPHAGE) 1000 MG tablet; Take 1 tablet (1,000 mg total) by mouth 2 (two) times daily with a meal.  Dispense: 60 tablet; Refill: 5 - Lipid Panel; Future - Microalbumin / creatinine urine ratio; Future Referral to opthalmology for eye exam.  3. Other hyperlipidemia Continue with current medications  - Lipid Panel; Future - simvastatin (ZOCOR) 20 MG tablet; Take 1 tablet (20 mg total) by mouth daily at 6 PM.  Dispense: 90 tablet; Refill: 2  4. Uncontrolled type 2 diabetes mellitus with hyperglycemia (HCC) - Insulin Glargine (LANTUS SOLOSTAR) 100 UNIT/ML Solostar Pen; Inject 50 Units into the skin at bedtime.  Dispense: 15 mL; Refill:  3 - glucose blood (TRUE METRIX BLOOD GLUCOSE TEST) test strip; Use as instructed  Dispense: 100 each; Refill: 12  5. Neuropathy Continue  with current medications  - gabapentin (NEURONTIN) 100 MG capsule; Take 1 capsule (100 mg total) by mouth 3 (three) times daily.  Dispense: 90 capsule; Refill: 3  6. Vitamin deficiency - Vitamin B12; Future - Vitamin D, 25-hydroxy; Future 7. Memory loss Vitamin D/B12 and TSH.  No abnormalities on exam.    8. Right shoulder pain, unspecified chronicity Biofreeze topically. Discussed not using other people's medication - DG Shoulder Right; Future     Medication: dosage change Increased Lantus to 50 unit QHS. Marland Kitchen Dietary sodium restriction. Regular aerobic exercise.   Labs ordered to rule out cause for memory loss: Vitamin D, b12 and TSH. Patient to return for fasting labs. Will follow up in 4 weeks for DM2

## 2018-05-19 NOTE — Patient Instructions (Addendum)
I increased your Lantus to 50 units at bed time. The goal is for you fasting blood sugars to be 150 or less. I am checking your b12 and vitamin d, cholesterol, kidney and liver function and complete blood count. I will call you with the results.  I sent biofreeze for your shoulder pain.  Shoulder Pain Many things can cause shoulder pain, including:  An injury.  Moving the arm in the same way again and again (overuse).  Joint pain (arthritis).  Follow these instructions at home: Take these actions to help with your pain:  Squeeze a soft ball or a foam pad as much as you can. This helps to prevent swelling. It also makes the arm stronger.  Take over-the-counter and prescription medicines only as told by your doctor.  If told, put ice on the area: ? Put ice in a plastic bag. ? Place a towel between your skin and the bag. ? Leave the ice on for 20 minutes, 2-3 times per day. Stop putting on ice if it does not help with the pain.  If you were given a shoulder sling or immobilizer: ? Wear it as told. ? Remove it to shower or bathe. ? Move your arm as little as possible. ? Keep your hand moving. This helps prevent swelling.  Contact a doctor if:  Your pain gets worse.  Medicine does not help your pain.  You have new pain in your arm, hand, or fingers. Get help right away if:  Your arm, hand, or fingers: ? Tingle. ? Are numb. ? Are swollen. ? Are painful. ? Turn white or blue. This information is not intended to replace advice given to you by your health care provider. Make sure you discuss any questions you have with your health care provider. Document Released: 04/09/2008 Document Revised: 06/17/2016 Document Reviewed: 02/14/2015 Elsevier Interactive Patient Education  [employer].

## 2018-06-19 ENCOUNTER — Ambulatory Visit: Payer: Commercial Managed Care - PPO | Admitting: Family Medicine

## 2018-06-23 ENCOUNTER — Ambulatory Visit: Payer: Commercial Managed Care - PPO | Admitting: Family Medicine

## 2018-10-01 ENCOUNTER — Encounter (HOSPITAL_BASED_OUTPATIENT_CLINIC_OR_DEPARTMENT_OTHER): Payer: Self-pay | Admitting: Emergency Medicine

## 2018-10-01 ENCOUNTER — Emergency Department (HOSPITAL_BASED_OUTPATIENT_CLINIC_OR_DEPARTMENT_OTHER)
Admission: EM | Admit: 2018-10-01 | Discharge: 2018-10-01 | Disposition: A | Discharge status: Home / Self Care | Payer: Commercial Managed Care - PPO | Attending: Emergency Medicine | Admitting: Emergency Medicine

## 2018-10-01 ENCOUNTER — Emergency Department (HOSPITAL_BASED_OUTPATIENT_CLINIC_OR_DEPARTMENT_OTHER): Payer: Commercial Managed Care - PPO

## 2018-10-01 ENCOUNTER — Other Ambulatory Visit: Payer: Self-pay

## 2018-10-01 DIAGNOSIS — Z79899 Other long term (current) drug therapy: Secondary | ICD-10-CM | POA: Diagnosis not present

## 2018-10-01 DIAGNOSIS — I10 Essential (primary) hypertension: Secondary | ICD-10-CM | POA: Diagnosis not present

## 2018-10-01 DIAGNOSIS — I2510 Atherosclerotic heart disease of native coronary artery without angina pectoris: Secondary | ICD-10-CM | POA: Diagnosis not present

## 2018-10-01 DIAGNOSIS — E1165 Type 2 diabetes mellitus with hyperglycemia: Secondary | ICD-10-CM | POA: Diagnosis not present

## 2018-10-01 DIAGNOSIS — Z7982 Long term (current) use of aspirin: Secondary | ICD-10-CM | POA: Insufficient documentation

## 2018-10-01 DIAGNOSIS — R739 Hyperglycemia, unspecified: Secondary | ICD-10-CM

## 2018-10-01 DIAGNOSIS — M25512 Pain in left shoulder: Secondary | ICD-10-CM | POA: Diagnosis not present

## 2018-10-01 DIAGNOSIS — M545 Low back pain, unspecified: Secondary | ICD-10-CM

## 2018-10-01 DIAGNOSIS — Z955 Presence of coronary angioplasty implant and graft: Secondary | ICD-10-CM | POA: Insufficient documentation

## 2018-10-01 DIAGNOSIS — M25511 Pain in right shoulder: Secondary | ICD-10-CM | POA: Insufficient documentation

## 2018-10-01 DIAGNOSIS — Z794 Long term (current) use of insulin: Secondary | ICD-10-CM | POA: Insufficient documentation

## 2018-10-01 DIAGNOSIS — R52 Pain, unspecified: Secondary | ICD-10-CM | POA: Diagnosis present

## 2018-10-01 LAB — URINALYSIS, ROUTINE W REFLEX MICROSCOPIC
BILIRUBIN URINE: NEGATIVE
Glucose, UA: >=500 mg/dL — AB
Hgb urine dipstick: NEGATIVE
KETONES UR: NEGATIVE mg/dL
Leukocytes, UA: NEGATIVE
NITRITE: NEGATIVE
PH: 6 (ref 5.0–8.0)
PROTEIN: NEGATIVE mg/dL
Specific Gravity, Urine: 1.02 (ref 1.005–1.030)

## 2018-10-01 LAB — CBG MONITORING, ED: GLUCOSE-CAPILLARY: 250 mg/dL — AB (ref 70–99)

## 2018-10-01 LAB — URINALYSIS, MICROSCOPIC (REFLEX)

## 2018-10-01 IMAGING — CR DG SHOULDER 2+V*L*
3 series · 3 of 3 positions shown · non-contrast
Comparison: None.

CLINICAL DATA: Left shoulder pain for 1 month.

EXAM:
LEFT SHOULDER - 2+ VIEW

[w shoulder grashey left]
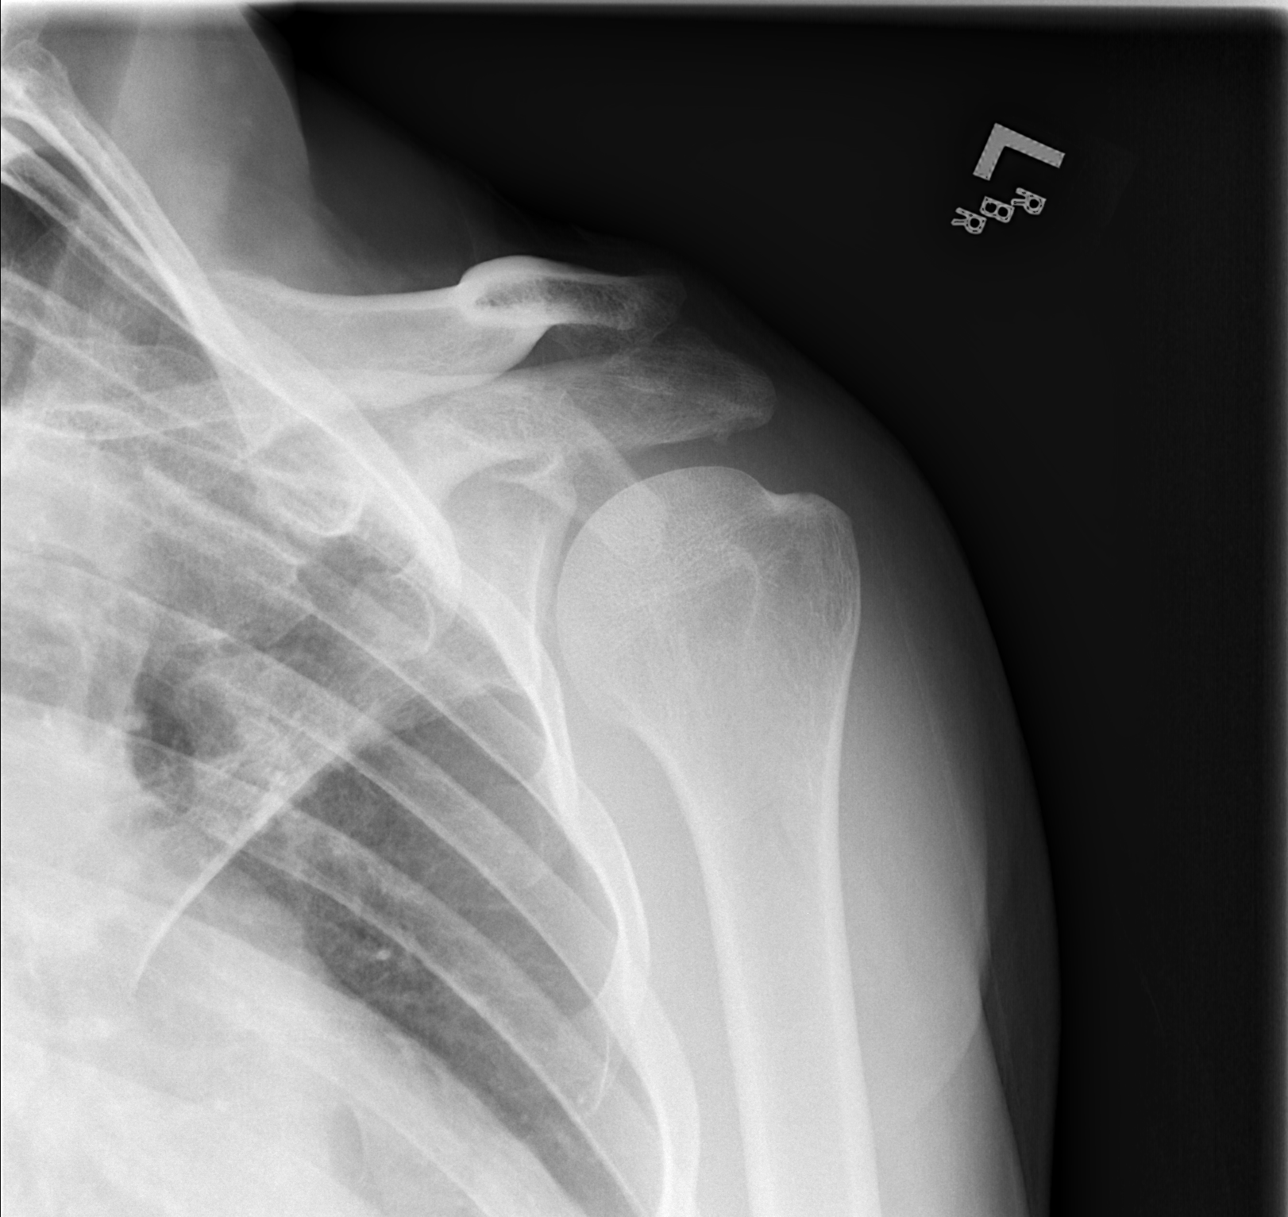

[w shoulder y view left]
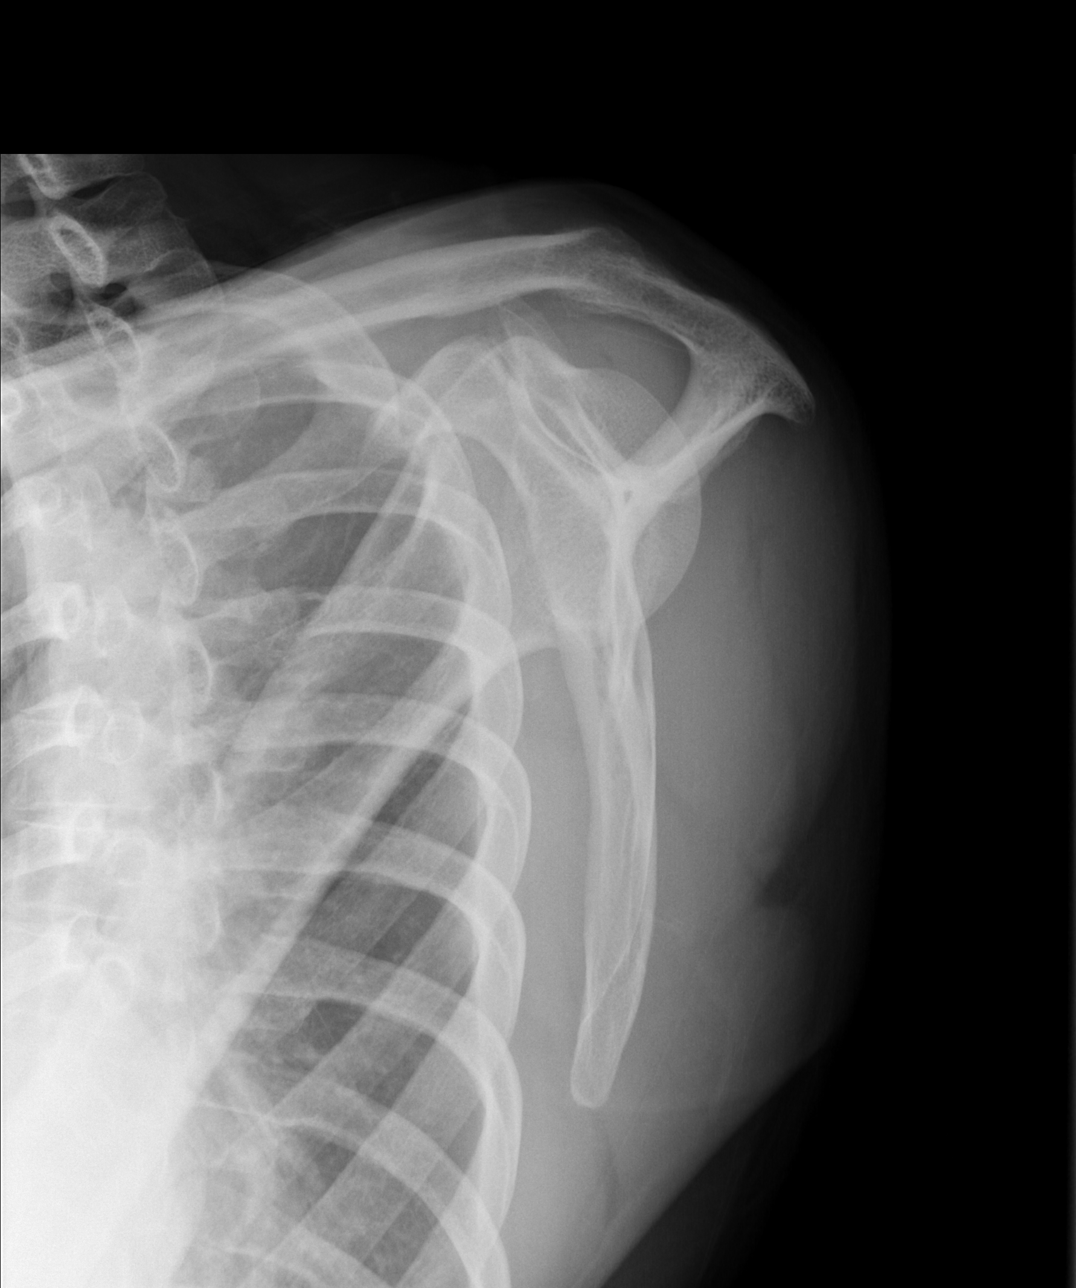

[x shoulder axillary left]
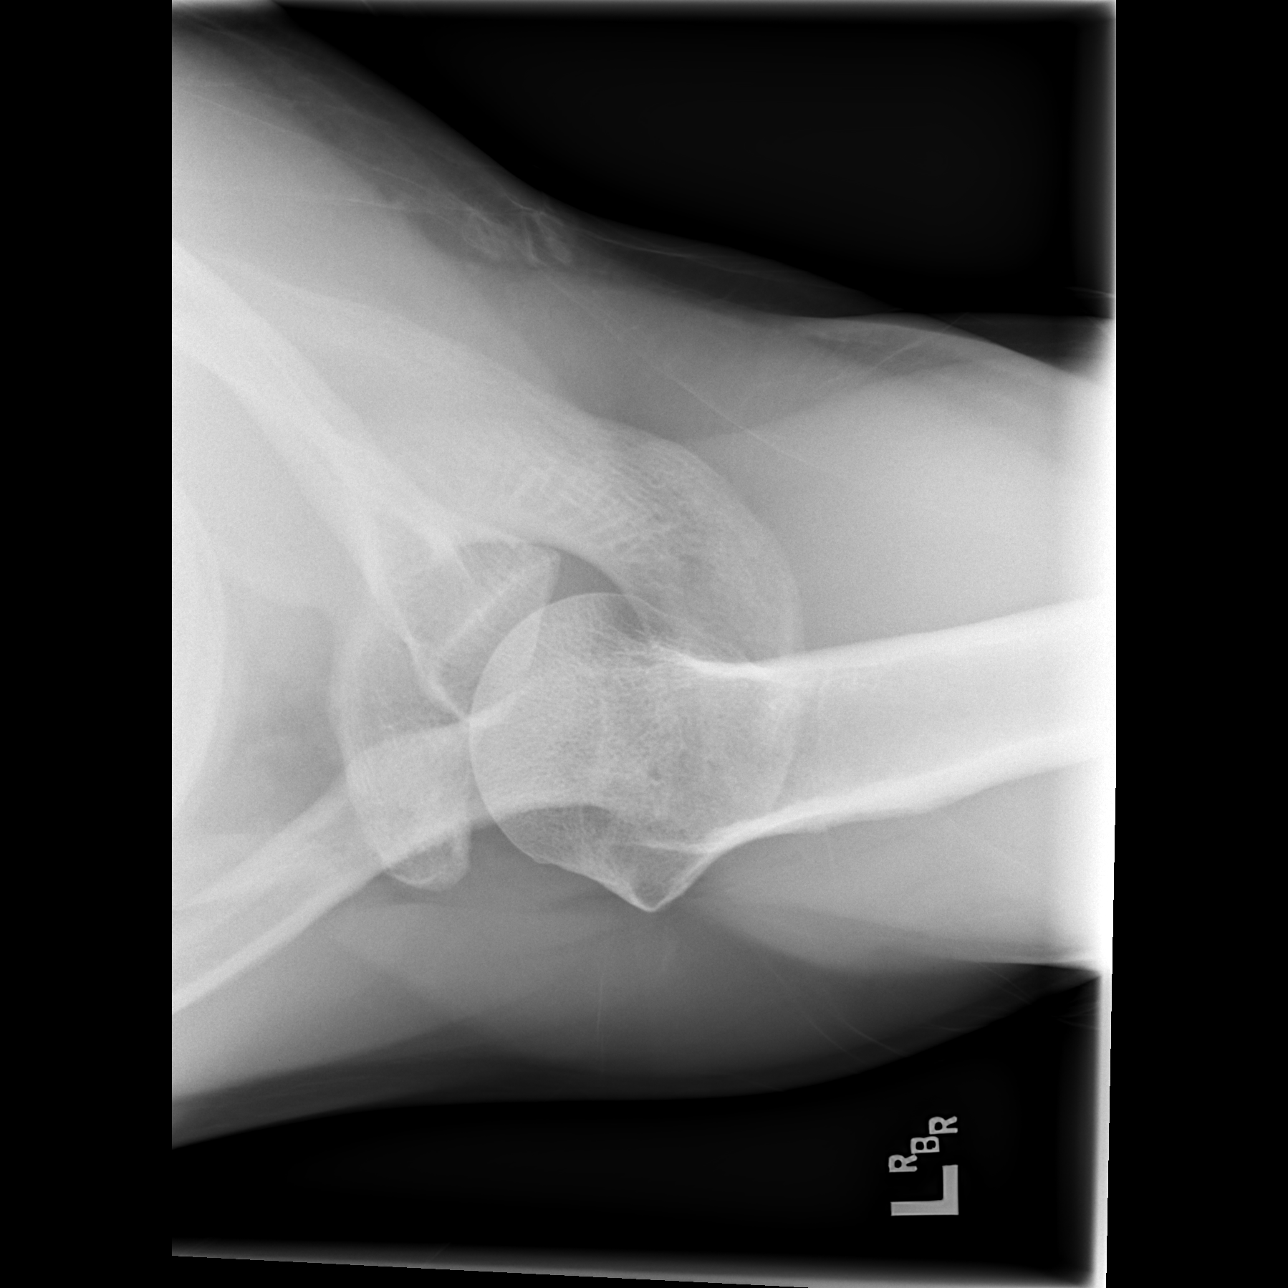

[3 of 3 positions shown; findings below may reference images not displayed]

FINDINGS: There is no evidence of fracture or dislocation. There is no
evidence of arthropathy or other focal bone abnormality. Soft
tissues are unremarkable.
IMPRESSION: Negative left shoulder radiographs.

## 2018-10-01 IMAGING — CR DG SHOULDER 2+V*R*
3 series · 3 of 3 positions shown · non-contrast
Comparison: None.

CLINICAL DATA: Right shoulder pain for the past month. History of
rotator cuff tear and previous injections.

EXAM:
RIGHT SHOULDER - 2+ VIEW

[w shoulder grashey right]
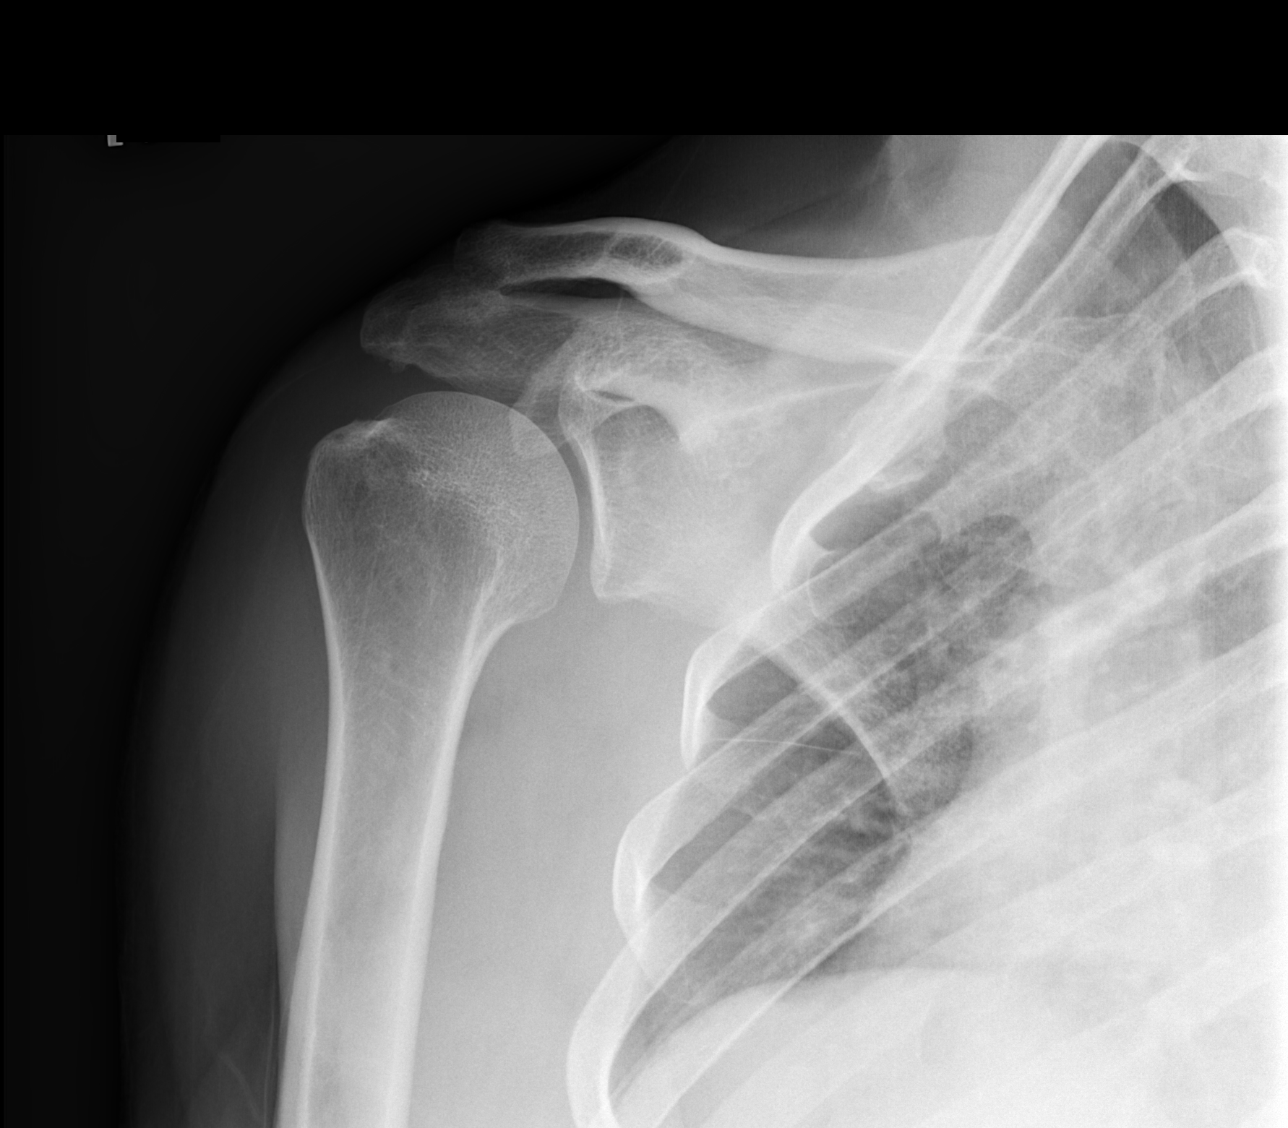

[w shoulder y view right]
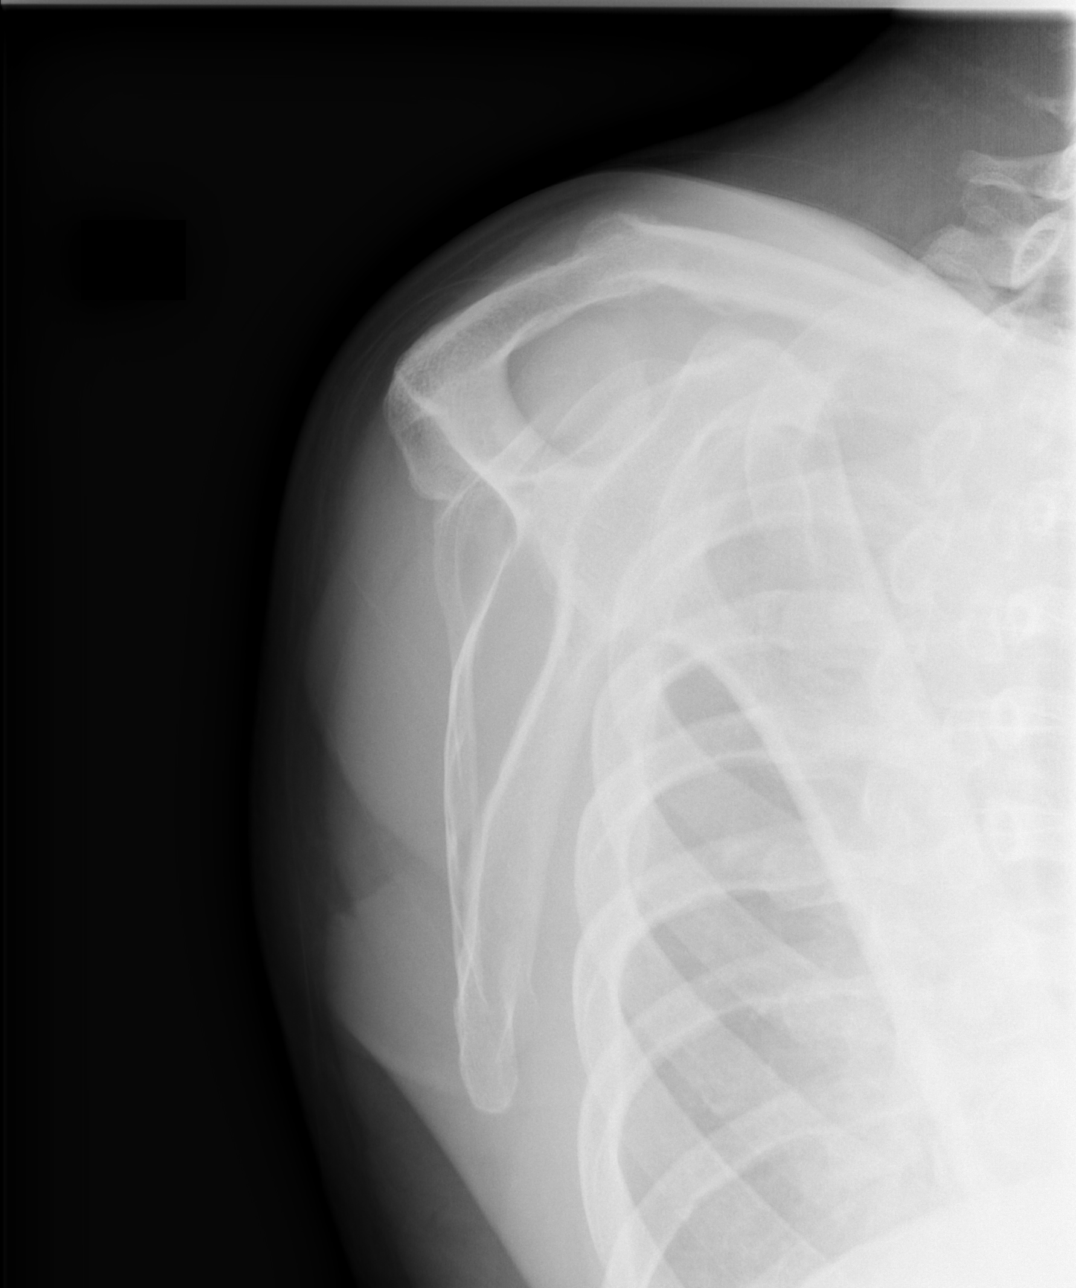

[x shoulder axillary right]
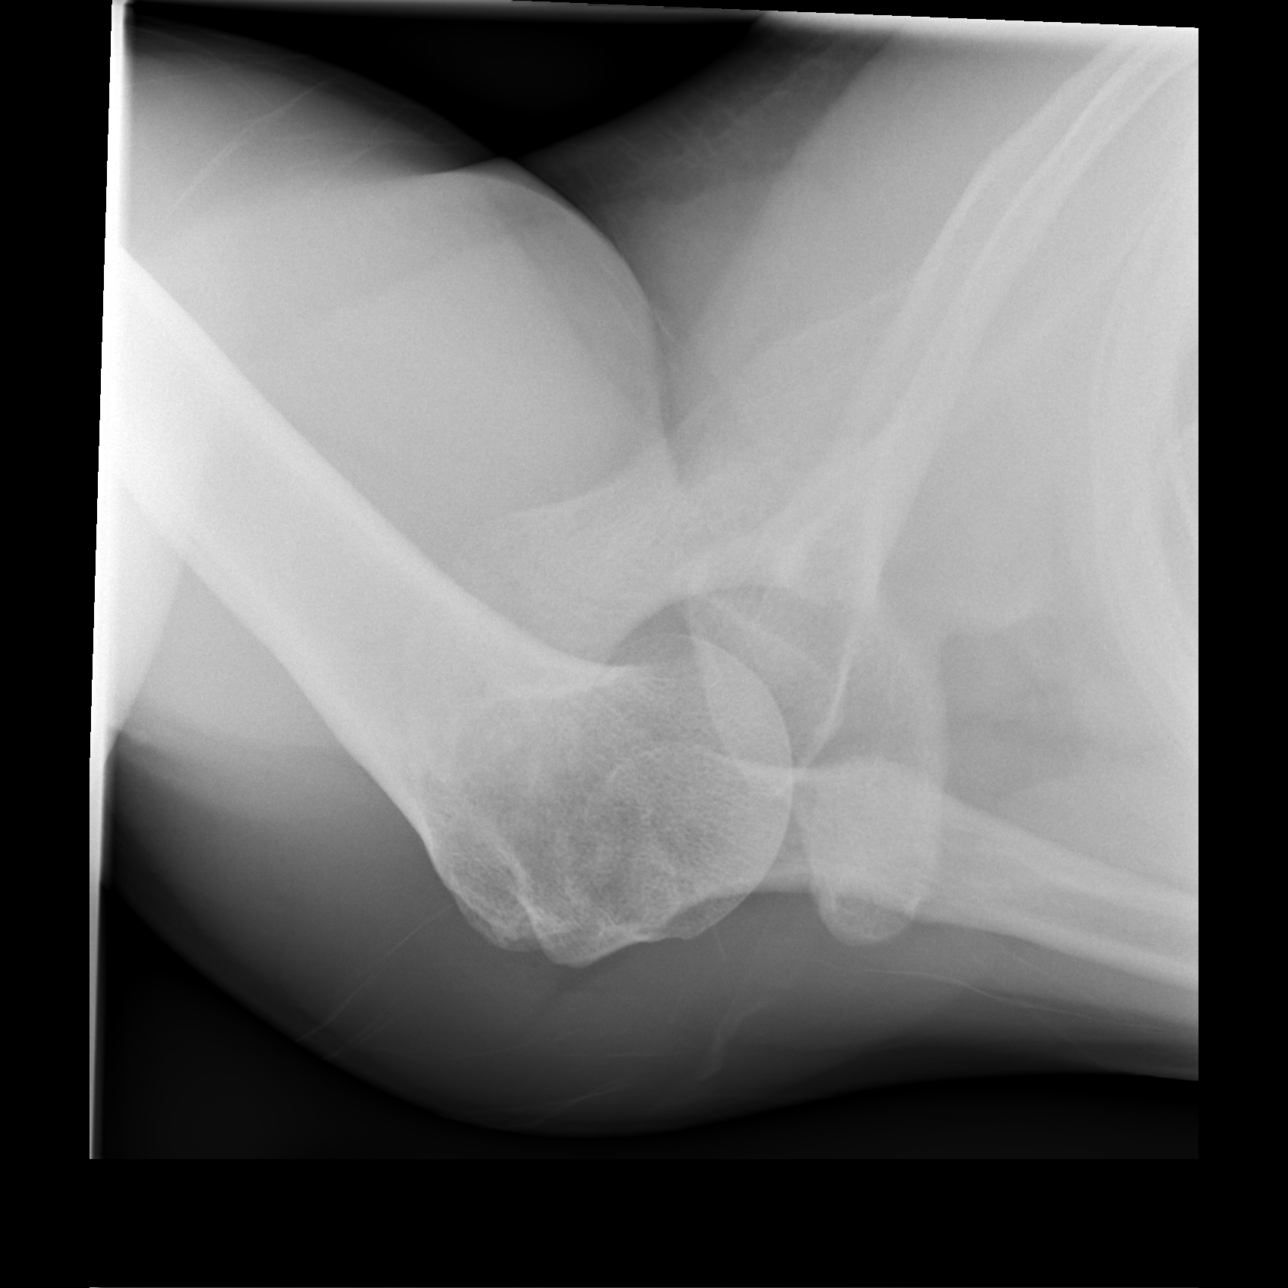

[3 of 3 positions shown; findings below may reference images not displayed]

FINDINGS: The bones are subjectively adequately mineralized. The glenohumeral
joint space is well-maintained. The AC joint is not well
demonstrated but is grossly normal. There is a small subacromial
spur. There is mild irregularity of the greater tuberosity of the
humerus. There is no acute fracture.
IMPRESSION: Small subacromial spur.  No acute bony abnormality.

## 2018-10-01 IMAGING — CR DG LUMBAR SPINE COMPLETE 4+V
5 series · 5 of 5 positions shown · non-contrast
Comparison: None.

CLINICAL DATA: Bilateral low back pain extending into the right
lower extremity for 1 month.

EXAM:
LUMBAR SPINE - COMPLETE 4+ VIEW

[t l-spine a.p.]
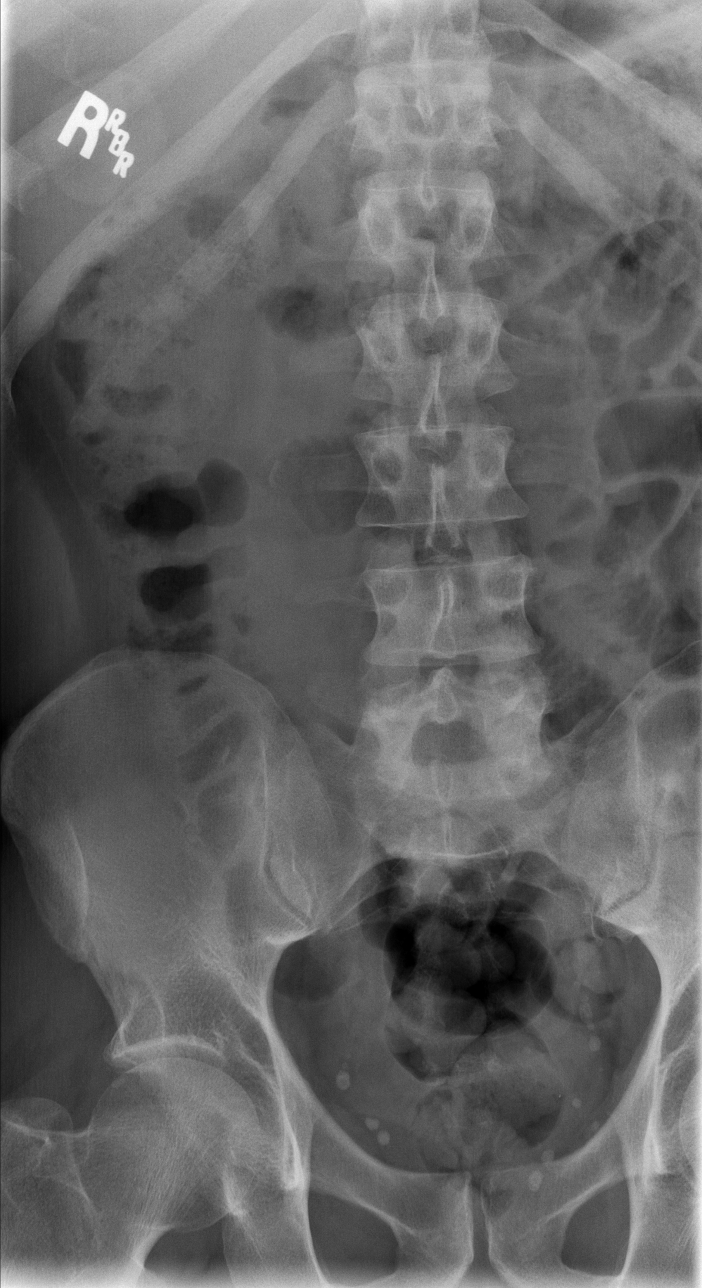

[t l-spine oblique exposure (1 of 2)]
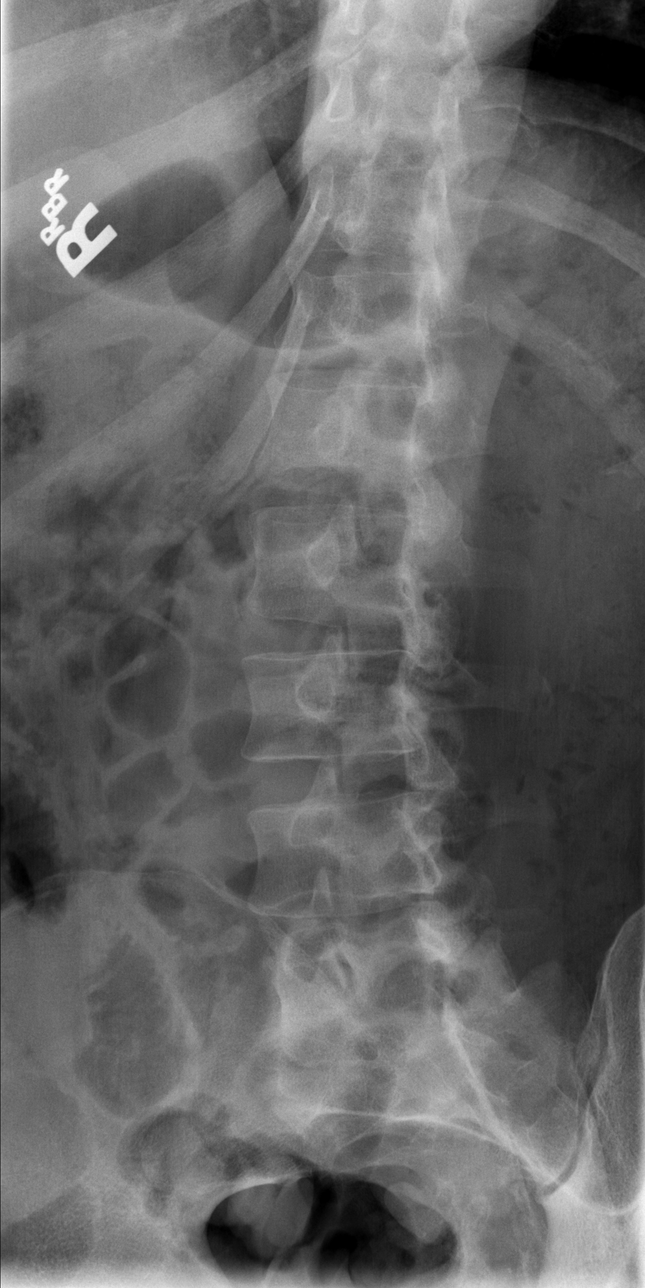

[t l-spine oblique exposure (2 of 2)]
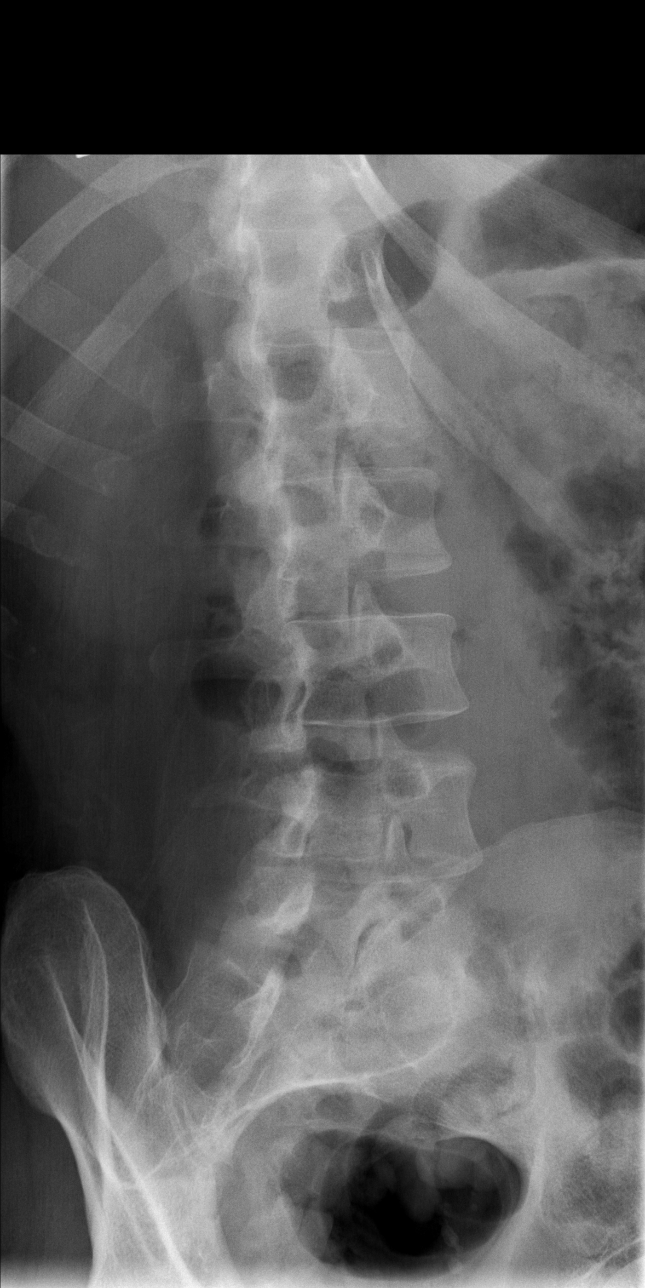

[t l-spine lat]
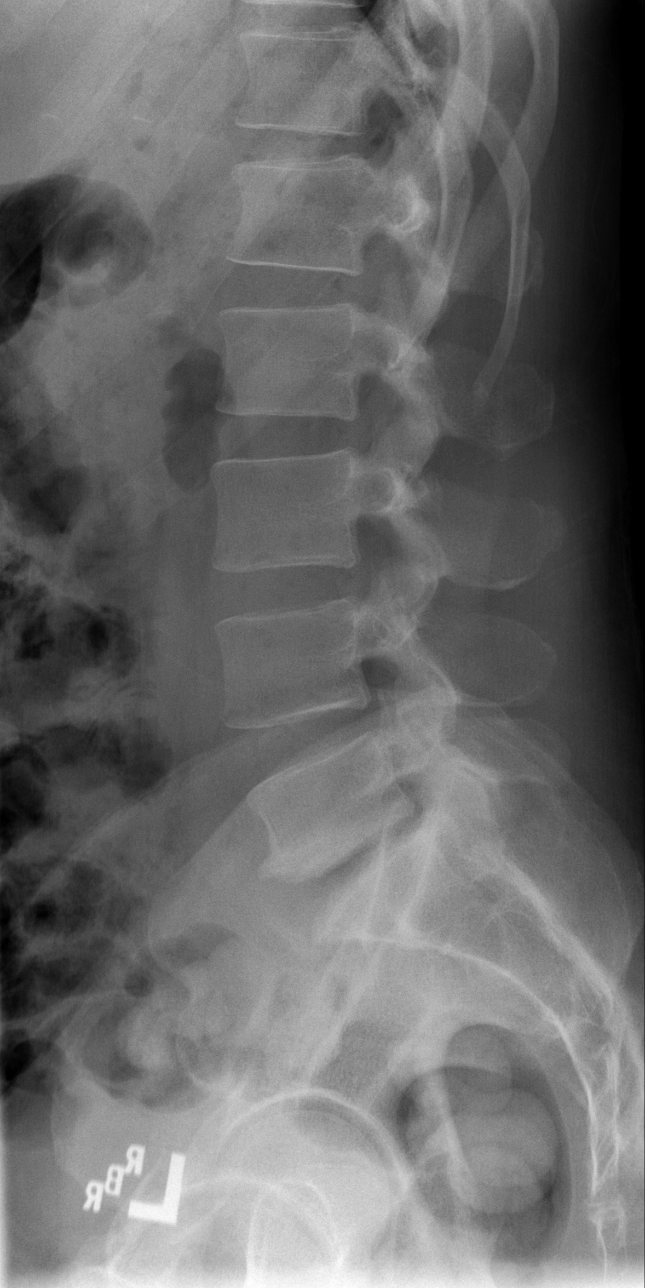

[t l-spine l5-s1 spot]
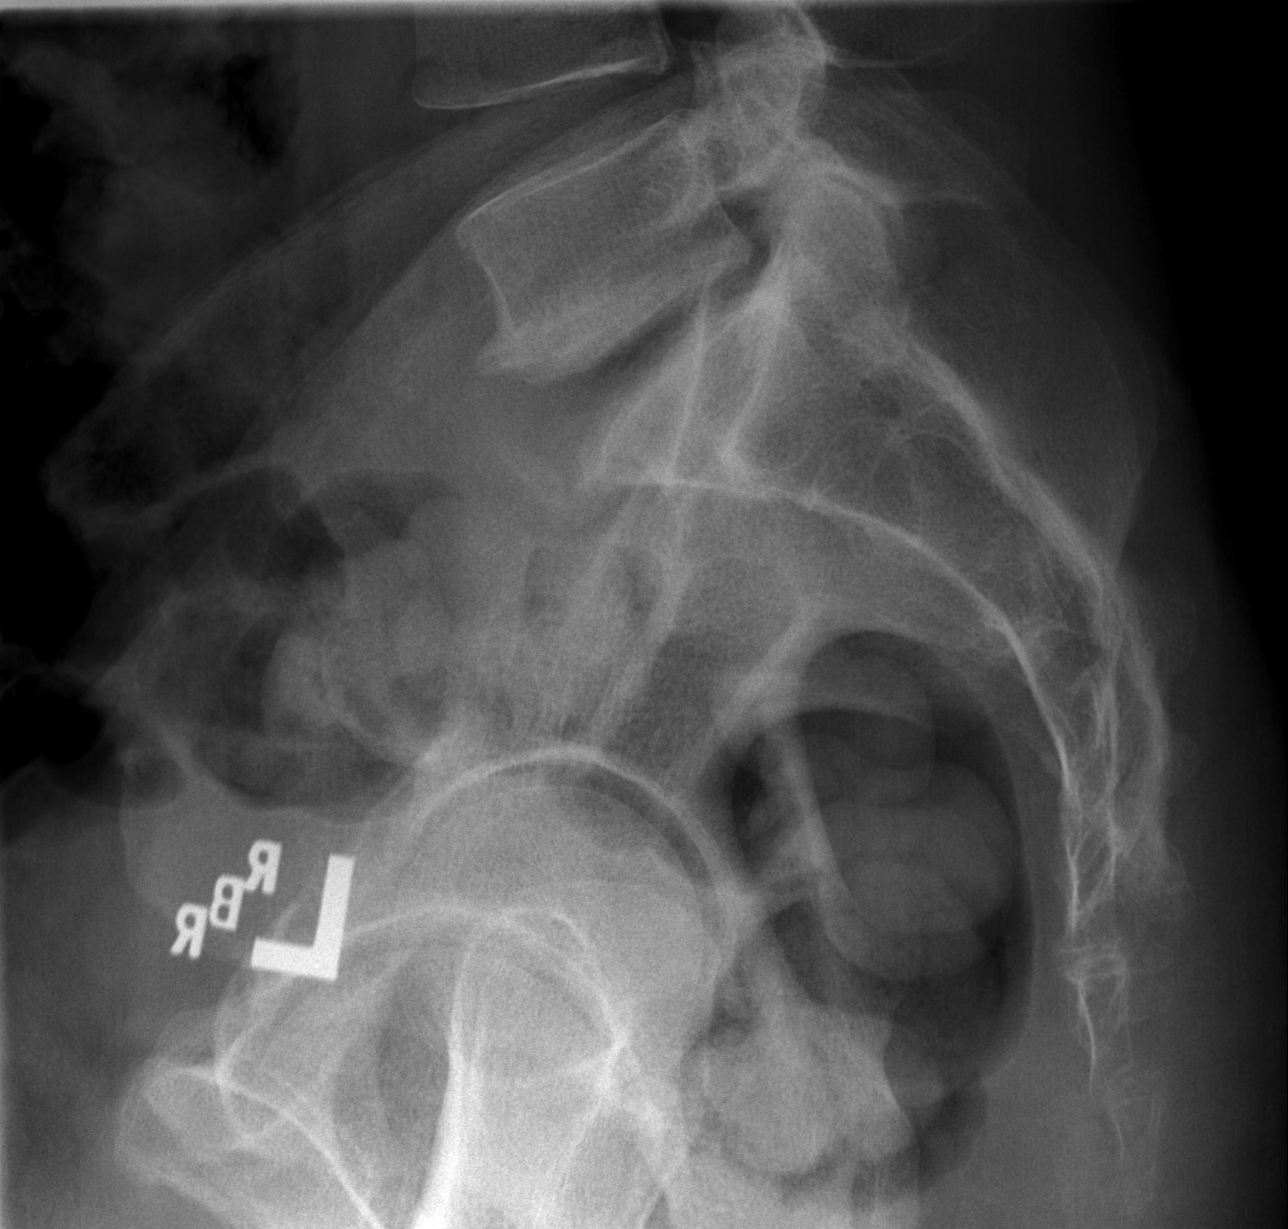

[5 of 5 positions shown; findings below may reference images not displayed]

FINDINGS: Five non rib-bearing lumbar type vertebral bodies are present.
Vertebral body heights alignment are maintained. Endplate changes
and loss of disc height are present at L5-S1. Facet hypertrophy is
worse on the left at L5-S1.
IMPRESSION: 1. No acute abnormality.
2. Degenerative disc disease at L5-S1 with left greater than right
facet hypertrophy. Stenosis is suspected.

## 2018-10-01 MED ORDER — METHOCARBAMOL 500 MG PO TABS: 500.0000 mg | ORAL_TABLET | Freq: Three times a day (TID) | ORAL | 0 refills | Status: DC | PRN

## 2018-10-01 MED FILL — METHOCARBAMOL 500 MG TABLET: 500 | 10 days supply | Qty: 30 | Fill #0

## 2018-10-01 NOTE — Discharge Instructions (Signed)
You were seen in the emergency department today for shoulder and back pain.  Your x-ray does not show any fractures or dislocations.  Your x-rays of your lower back as well as your left shoulder did show some degenerative changes.  We are sending you home with a prescription for Robaxin to help with this pain.  - Robaxin is the muscle relaxer I have prescribed, this is meant to help with muscle tightness. Be aware that this medication may make you drowsy therefore the first time you take this it should be at a time you are in an environment where you can rest. Do not drive or operate heavy machinery when taking this medication. Do not drink alcohol or take other sedating medications with this medicine such as narcotics or benzodiazepines.   You make take Tylenol per over the counter dosing with these medications.   We have prescribed you new medication(s) today. Discuss the medications prescribed today with your pharmacist as they can have adverse effects and interactions with your other medicines including over the counter and prescribed medications. Seek medical evaluation if you start to experience new or abnormal symptoms after taking one of these medicines, seek care immediately if you start to experience difficulty breathing, feeling of your throat closing, facial swelling, or rash as these could be indications of a more serious allergic reaction  Your blood sugar was elevated in the emergency department today at 250, there was also sugar in your urine.  It is extremely important that you take all of your medications for your chronic medical problems as prescribed.  Is also important that you follow-up with your primary care provider within 1 week for reevaluation of your symptoms as well as a recheck of your blood work.  May also follow-up with Dr. Barbaraann Barthel, a sports medicine doctor, in regards to your back and shoulder pain.  Return to the ER for new or worsening symptoms or any other  concerns.

## 2018-10-01 NOTE — ED Triage Notes (Signed)
R arm, L shoulder and low back pain x 1 month. Denies injury.

## 2018-10-01 NOTE — ED Notes (Signed)
ED Provider at bedside. 

## 2018-10-01 NOTE — ED Provider Notes (Signed)
[facility] EMERGENCY DEPARTMENT Provider Note   CSN: [id number] Arrival date & time: 10/01/18  5102     History   Chief Complaint Chief Complaint  Patient presents with  . Generalized Body Aches    HPI Dean Robertson is a 57 y.o. male with a hx of CAD s/p stent placement, T2DM, HTN, and hypercholesterolemia who presents to the ED with complaints of bilateral shoulder pain and lower back pain which is acute on chronic over the past 1.5 months. Patient states that he has had intermittent issues with the shoulders and his back s/p heavy lifting injury at a job in the early 2000s, he states this feels the same. He states his pain starts in the shoulders bilaterally and radiates into the RUE, he also notes lower back which can radiate into the RLE at times. Pain is constant, initially alleviated with 800 mg ibuprofen which has since stopped working, worsened with certain positions and movement. He states hx of similar has improved with intra-articular injections of the shoulders, but he has not had these in 10+ years. Denies numbness, tingling, weakness, saddle anesthesia, incontinence to bowel/bladder, fever, chills, IV drug use, dysuria, or hx of cancer. Patient has not had prior back surgeries. He relays this feels not at all similar to prior CAD events. Patient is currently on simvastatin 20 mg daily, he has not had any recent dose changes, he does note he has not taken his medicines in 2 weeks. His urine has looked "golden", no dysuria, hematuria, or fevers.   HPI  Past Medical History:  Diagnosis Date  . Coronary artery disease   . Diabetes mellitus without complication ([place])   . High cholesterol   . Hypertension     Patient Active Problem List   Diagnosis Date Noted  . Essential hypertension 12/08/2017  . Type 2 diabetes mellitus without complication, with long-term current use of insulin ([place]) 12/08/2017  . Chest pain 10/04/2017    Past Surgical History:  Procedure  Laterality Date  . CORONARY ANGIOPLASTY WITH STENT PLACEMENT          Home Medications    Prior to Admission medications   Medication Sig Start Date End Date Taking? Authorizing Provider  acetaminophen (TYLENOL) 500 MG tablet Take 1 tablet (500 mg total) by mouth every 6 (six) hours as needed. 12/08/17   Dorena Dew, FNP  aspirin EC 81 MG tablet Take 81 mg by mouth daily.    [provider]  Blood Glucose Monitoring Suppl (TRUE METRIX AIR GLUCOSE METER) w/Device KIT 1 each 3 (three) times daily by Does not apply route. 09/19/17   Dorena Dew, FNP  gabapentin (NEURONTIN) 100 MG capsule Take 1 capsule (100 mg total) by mouth 3 (three) times daily. 05/19/18   Lanae Boast, FNP  glucose blood (TRUE METRIX BLOOD GLUCOSE TEST) test strip Use as instructed 05/19/18   Lanae Boast, FNP  Insulin Glargine (LANTUS SOLOSTAR) 100 UNIT/ML Solostar Pen Inject 50 Units into the skin at bedtime. 05/19/18   Lanae Boast, FNP  Lancets MISC 1 each 3 (three) times daily by Does not apply route. 09/19/17   Dorena Dew, FNP  lisinopril (PRINIVIL,ZESTRIL) 20 MG tablet Take 1 tablet (20 mg total) by mouth daily. 05/19/18   Lanae Boast, FNP  Menthol, Topical Analgesic, (BIOFREEZE) 4 % GEL Apply 1 application topically 4 (four) times daily as needed. 05/19/18   Lanae Boast, FNP  metFORMIN (GLUCOPHAGE) 1000 MG tablet Take 1 tablet (1,000 mg total) by  mouth 2 (two) times daily with a meal. 05/19/18   Lanae Boast, FNP  propranolol (INDERAL) 40 MG tablet Take 1 tablet (40 mg total) by mouth 2 (two) times daily. Patient not taking: Reported on 05/19/2018 01/11/18   Sherwood Gambler, MD  simvastatin (ZOCOR) 20 MG tablet Take 1 tablet (20 mg total) by mouth daily at 6 PM. 05/19/18   Lanae Boast, FNP    Family History Family History  Problem Relation Age of Onset  . Hypertension Mother   . Diabetes Father     Social History Social History   Tobacco Use  . Smoking status: Never  Smoker  . Smokeless tobacco: Never Used  Substance Use Topics  . Alcohol use: No  . Drug use: No     Allergies   Patient has no known allergies.   Review of Systems Review of Systems  Constitutional: Negative for chills, fever and unexpected weight change.  Respiratory: Negative for shortness of breath.   Cardiovascular: Negative for chest pain.  Gastrointestinal: Negative for abdominal pain, nausea and vomiting.  Genitourinary: Negative for dysuria, hematuria and testicular pain.       Positive for "golden" urine.  Musculoskeletal: Positive for arthralgias and back pain.  Neurological: Negative for weakness and numbness.       Negative for incontinence or saddle anesthesia.   All other systems reviewed and are negative.    Physical Exam Updated Vital Signs BP 128/89 (BP Location: Left Arm)   Pulse 80   Temp 98.1 F (36.7 C) (Oral)   Resp 16   Ht _0  (1.727 m)   Wt 81.2 kg   SpO2 100%   BMI 27.22 kg/m   Physical Exam  Constitutional: He appears well-developed and well-nourished.  Non-toxic appearance. No distress.  HENT:  Head: Normocephalic and atraumatic.  Eyes: Conjunctivae are normal. Right eye exhibits no discharge. Left eye exhibits no discharge.  Neck: Normal range of motion. Neck supple. No spinous process tenderness present.  No palpable step off.   Cardiovascular: Normal rate, regular rhythm and normal pulses.  Pulses:      Radial pulses are 2+ on the right side, and 2+ on the left side.       Posterior tibial pulses are 2+ on the right side, and 2+ on the left side.  Pulmonary/Chest: Effort normal and breath sounds normal. No respiratory distress. He has no wheezes. He has no rhonchi. He has no rales.  Respiration even and unlabored  Abdominal: Soft. He exhibits no distension. There is no tenderness.  Musculoskeletal:  No obvious deformity, appreciable swelling, erythema, ecchymosis, or open wounds Upper extremities: Patient has full active range  of motion to bilateral shoulders, elbows, wrist, and all digits.  He has discomfort with bilateral shoulder flexion and abduction but is able to perform these fully.  He is tender to palpation specifically over bilateral trapezius muscles as well as the diffuse glenohumeral joint without palpable point/focal bony tenderness.  Upper extremities are otherwise nontender Back: No point/focal midline tenderness.  No palpable step-off.  Patient is tender to bilateral lumbar paraspinal muscle regions as well as bilateral thoracic paraspinal muscle regions. Lower extreme is: Normal active range of motion without palpable bony tenderness.  Neurological: He is alert.  Clear speech.  Station grossly intact bilateral upper and lower extremities.  5 out of 5 symmetric grip strength.  5 out of 5 strength with plantar and dorsiflexion bilaterally.  Patellar DTRs are 2+ and symmetric.  Patient is ambulatory.  Skin:  Skin is warm and dry. No rash noted.  Psychiatric: He has a normal mood and affect. His behavior is normal.  Nursing note and vitals reviewed.    ED Treatments / Results  Labs (all labs ordered are listed, but only abnormal results are displayed) Labs Reviewed  URINALYSIS, ROUTINE W REFLEX MICROSCOPIC - Abnormal; Notable for the following components:      Result Value   Glucose, UA >=500 (*)    All other components within normal limits  URINALYSIS, MICROSCOPIC (REFLEX) - Abnormal; Notable for the following components:   Bacteria, UA RARE (*)    All other components within normal limits  CBG MONITORING, ED - Abnormal; Notable for the following components:   Glucose-Capillary 250 (*)    All other components within normal limits    EKG None  Radiology Dg Lumbar Spine Complete  Result Date: 10/01/2018 CLINICAL DATA:  Bilateral low back pain extending into the right lower extremity for 1 month. EXAM: LUMBAR SPINE - COMPLETE 4+ VIEW COMPARISON:  None. FINDINGS: Five non rib-bearing lumbar type  vertebral bodies are present. Vertebral body heights alignment are maintained. Endplate changes and loss of disc height are present at L5-S1. Facet hypertrophy is worse on the left at L5-S1. IMPRESSION: 1. No acute abnormality. 2. Degenerative disc disease at L5-S1 with left greater than right facet hypertrophy. Stenosis is suspected. Electronically Signed   By: San Morelle M.D.   On: 10/01/2018 11:12   Dg Shoulder Right  Result Date: 10/01/2018 CLINICAL DATA:  Right shoulder pain for the past month. History of rotator cuff tear and previous injections. EXAM: RIGHT SHOULDER - 2+ VIEW COMPARISON:  None. FINDINGS: The bones are subjectively adequately mineralized. The glenohumeral joint space is well-maintained. The [facility] joint is not well demonstrated but is grossly normal. There is a small subacromial spur. There is mild irregularity of the greater tuberosity of the humerus. There is no acute fracture. IMPRESSION: Small subacromial spur.  No acute bony abnormality. Electronically Signed   By: David  Martinique M.D.   On: 10/01/2018 11:06   Dg Shoulder Left  Result Date: 10/01/2018 CLINICAL DATA:  Left shoulder pain for 1 month. EXAM: LEFT SHOULDER - 2+ VIEW COMPARISON:  None. FINDINGS: There is no evidence of fracture or dislocation. There is no evidence of arthropathy or other focal bone abnormality. Soft tissues are unremarkable. IMPRESSION: Negative left shoulder radiographs. Electronically Signed   By: San Morelle M.D.   On: 10/01/2018 11:08    Procedures Procedures (including critical care time)  Medications Ordered in ED Medications - No data to display   Initial Impression / Assessment and Plan / ED Course  I have reviewed the triage vital signs and the nursing notes.  Pertinent labs & imaging results that were available during my care of the patient were reviewed by me and considered in my medical decision making (see chart for details).   Patient presents to the emergency  department with arthralgias of bilateral shoulders as well as lower back pain.  Nontoxic-appearing, no apparent distress, vitals WNL.  On exam patient's pain is reproducible with muscular palpation as well as with active range of motion and specific joints-therefore there is a high suspicion for MSK etiology. He is ambulatory in the ED.  No back pain red flags.  Considered ACS, PE, UTI/pyelonephritis, kidney stone, aortic aneurysm/dissection, cauda equina or epidural abscess however these do not fit clinical picture at this time.  X-rays obtained at patient request, negative for fracture/dislocation, some degenerative changes  present. Urinalysis obtained given "golden urine" reported and patient on statin w/ non compliance- no hemoglobinuria to raise concern for rhabdomyolysis. Patient does no have notable glucosuria, CBG checked and 250, likely due to med non compliance, given no ketonuria- doubt DKA.  Suspect pain is MSK in origin. Muscle relaxant provided, NSAIDs avoided secondary to CAD, steroids avoided secondary to diabetes. Discussed not driving/operating heavy machinery when taking muscle relaxant. We also discussed need for med compliance and PCP recheck of blood work and sxs. . I discussed treatment plan, need for PCP follow-up, and return precautions with the patient. Provided opportunity for questions, patient confirmed understanding and is in agreement with plan.   Findings and plan of care discussed with supervising physician Dr. Alvino Chapel- in agreement.   Final Clinical Impressions(s) / ED Diagnoses   Final diagnoses:  Bilateral shoulder pain, unspecified chronicity  Bilateral low back pain, unspecified chronicity, unspecified whether sciatica present  Hyperglycemia    ED Discharge Orders         Ordered    methocarbamol (ROBAXIN) 500 MG tablet  Every 8 hours PRN     10/01/18 1151           Juliany Daughety, [place], PA-C 10/01/18 1153    Davonna Belling, MD 10/01/18 1433

## 2018-10-09 ENCOUNTER — Encounter: Payer: Self-pay | Admitting: Family Medicine

## 2018-10-09 ENCOUNTER — Telehealth: Payer: Self-pay

## 2018-10-09 ENCOUNTER — Ambulatory Visit (INDEPENDENT_AMBULATORY_CARE_PROVIDER_SITE_OTHER): Payer: Commercial Managed Care - PPO | Admitting: Family Medicine

## 2018-10-09 VITALS — BP 140/76 | HR 82 | Temp 97.9°F | Resp 16 | Ht 69.0 in | Wt 173.0 lb

## 2018-10-09 DIAGNOSIS — M25512 Pain in left shoulder: Secondary | ICD-10-CM | POA: Diagnosis not present

## 2018-10-09 DIAGNOSIS — M62838 Other muscle spasm: Secondary | ICD-10-CM

## 2018-10-09 DIAGNOSIS — E7849 Other hyperlipidemia: Secondary | ICD-10-CM

## 2018-10-09 DIAGNOSIS — E119 Type 2 diabetes mellitus without complications: Secondary | ICD-10-CM | POA: Diagnosis not present

## 2018-10-09 DIAGNOSIS — M25511 Pain in right shoulder: Secondary | ICD-10-CM

## 2018-10-09 DIAGNOSIS — G8929 Other chronic pain: Secondary | ICD-10-CM | POA: Diagnosis not present

## 2018-10-09 DIAGNOSIS — I10 Essential (primary) hypertension: Secondary | ICD-10-CM

## 2018-10-09 DIAGNOSIS — E1165 Type 2 diabetes mellitus with hyperglycemia: Secondary | ICD-10-CM

## 2018-10-09 DIAGNOSIS — G629 Polyneuropathy, unspecified: Secondary | ICD-10-CM | POA: Diagnosis not present

## 2018-10-09 DIAGNOSIS — M545 Low back pain: Secondary | ICD-10-CM

## 2018-10-09 DIAGNOSIS — Z1211 Encounter for screening for malignant neoplasm of colon: Secondary | ICD-10-CM

## 2018-10-09 DIAGNOSIS — Z794 Long term (current) use of insulin: Secondary | ICD-10-CM

## 2018-10-09 MED ORDER — GABAPENTIN 300 MG PO CAPS: 300.0000 mg | ORAL_CAPSULE | Freq: Three times a day (TID) | ORAL | 2 refills | Status: DC

## 2018-10-09 MED ORDER — CYCLOBENZAPRINE HCL 10 MG PO TABS: 10.0000 mg | ORAL_TABLET | Freq: Three times a day (TID) | ORAL | 0 refills | Status: AC | PRN

## 2018-10-09 MED ORDER — INSULIN GLARGINE 100 UNIT/ML SOLOSTAR PEN: 50.0000 [IU] | PEN_INJECTOR | Freq: Every day | SUBCUTANEOUS | 3 refills | Status: DC

## 2018-10-09 MED ORDER — LISINOPRIL 20 MG PO TABS: 20.0000 mg | ORAL_TABLET | Freq: Every day | ORAL | 5 refills | Status: DC

## 2018-10-09 MED ORDER — MENTHOL (TOPICAL ANALGESIC) 4 % EX GEL: 1.0000 "application " | Freq: Four times a day (QID) | CUTANEOUS | 2 refills | Status: DC | PRN

## 2018-10-09 MED ORDER — METFORMIN HCL 1000 MG PO TABS: 1000.0000 mg | ORAL_TABLET | Freq: Two times a day (BID) | ORAL | 5 refills | Status: DC

## 2018-10-09 MED ORDER — SIMVASTATIN 20 MG PO TABS: 20.0000 mg | ORAL_TABLET | Freq: Every day | ORAL | 2 refills | Status: DC

## 2018-10-09 MED ORDER — KETOROLAC TROMETHAMINE 60 MG/2ML IM SOLN
60.0000 mg | Freq: Once | INTRAMUSCULAR | Status: AC
  Administered 2018-10-09: 60 mg via INTRAMUSCULAR

## 2018-10-09 MED FILL — metFORMIN HCL 1000 MG TABS: 1000 | 30 days supply | Qty: 60 | Fill #0

## 2018-10-09 MED FILL — CYCLOBENZAPRINE 10 MG TAB: 10 | 10 days supply | Qty: 30 | Fill #0

## 2018-10-09 MED FILL — GABAPENTIN 300 MG CAPSULE: 300 | 30 days supply | Qty: 90 | Fill #0

## 2018-10-09 MED FILL — SIMVASTATIN 20 MG TABLET: 20 | 30 days supply | Qty: 30 | Fill #0

## 2018-10-09 MED FILL — LISINOPRIL 20 MG TAB: 20 | 30 days supply | Qty: 30 | Fill #0

## 2018-10-09 MED FILL — LANTUS SOLOSTAR 100 UNITS/M: 100 | 30 days supply | Qty: 15 | Fill #0

## 2018-10-09 NOTE — Telephone Encounter (Signed)
Patient ment to ask you during appointment if you will send in a referral for a coloscopy for him. Please advise if this can be done. Thanks!

## 2018-10-09 NOTE — Patient Instructions (Signed)
Back Pain, Adult Back pain is very common. The pain often gets better over time. The cause of back pain is usually not dangerous. Most people can learn to manage their back pain on their own. Follow these instructions at home: Watch your back pain for any changes. The following actions may help to lessen any pain you are feeling:  Stay active. Start with short walks on flat ground if you can. Try to walk farther each day.  Exercise regularly as told by your doctor. Exercise helps your back heal faster. It also helps avoid future injury by keeping your muscles strong and flexible.  Do not sit, drive, or stand in one place for more than 30 minutes.  Do not stay in bed. Resting more than 1-2 days can slow down your recovery.  Be careful when you bend or lift an object. Use good form when lifting: ? Bend at your knees. ? Keep the object close to your body. ? Do not twist.  Sleep on a firm mattress. Lie on your side, and bend your knees. If you lie on your back, put a pillow under your knees.  Take medicines only as told by your doctor.  Put ice on the injured area. ? Put ice in a plastic bag. ? Place a towel between your skin and the bag. ? Leave the ice on for 20 minutes, 2-3 times a day for the first 2-3 days. After that, you can switch between ice and heat packs.  Avoid feeling anxious or stressed. Find good ways to deal with stress, such as exercise.  Maintain a healthy weight. Extra weight puts stress on your back.  Contact a doctor if:  You have pain that does not go away with rest or medicine.  You have worsening pain that goes down into your legs or buttocks.  You have pain that does not get better in one week.  You have pain at night.  You lose weight.  You have a fever or chills. Get help right away if:  You cannot control when you poop (bowel movement) or pee (urinate).  Your arms or legs feel weak.  Your arms or legs lose feeling (numbness).  You feel sick  to your stomach (nauseous) or throw up (vomit).  You have belly (abdominal) pain.  You feel like you may pass out (faint). This information is not intended to replace advice given to you by your health care provider. Make sure you discuss any questions you have with your health care provider. Document Released: 04/09/2008 Document Revised: 03/29/2016 Document Reviewed: 02/23/2014 Elsevier Interactive Patient Education  2018 [place]. Shoulder Pain Many things can cause shoulder pain, including:  An injury.  Moving the arm in the same way again and again (overuse).  Joint pain (arthritis).  Follow these instructions at home: Take these actions to help with your pain:  Squeeze a soft ball or a foam pad as much as you can. This helps to prevent swelling. It also makes the arm stronger.  Take over-the-counter and prescription medicines only as told by your doctor.  If told, put ice on the area: ? Put ice in a plastic bag. ? Place a towel between your skin and the bag. ? Leave the ice on for 20 minutes, 2-3 times per day. Stop putting on ice if it does not help with the pain.  If you were given a shoulder sling or immobilizer: ? Wear it as told. ? Remove it to shower or bathe. ?  Move your arm as little as possible. ? Keep your hand moving. This helps prevent swelling.  Contact a doctor if:  Your pain gets worse.  Medicine does not help your pain.  You have new pain in your arm, hand, or fingers. Get help right away if:  Your arm, hand, or fingers: ? Tingle. ? Are numb. ? Are swollen. ? Are painful. ? Turn white or blue. This information is not intended to replace advice given to you by your health care provider. Make sure you discuss any questions you have with your health care provider. Document Released: 04/09/2008 Document Revised: 06/17/2016 Document Reviewed: 02/14/2015 Elsevier Interactive Patient Education  [employer].

## 2018-10-09 NOTE — Progress Notes (Signed)
[facility]   Progress Note: Sick Visit Provider: Lanae Boast, FNP  SUBJECTIVE:   Dean Robertson is a 57 y.o. male who  has a past medical history of Coronary artery disease, Diabetes mellitus without complication ([place]), High cholesterol, and Hypertension.. Patient presents today for Shoulder Pain and Back Pain  Seen in the ED for this on 10/01/2018. Patient given script for robaxin as it was suspected that this is musculoskeletal in nature. Patient with past history of steroid injections with relief.  Patient reports taking ibuprofen without relief. Patient states that he took a "pink pill" from a friend that worked well. He is unsure of the name of this pill.  Review of Systems  Constitutional: Negative.   HENT: Negative.   Eyes: Negative.   Respiratory: Negative.   Cardiovascular: Negative.   Gastrointestinal: Negative.   Genitourinary: Negative.   Musculoskeletal: Positive for back pain and joint pain.  Skin: Negative.   Neurological: Negative.   Psychiatric/Behavioral: Negative.      OBJECTIVE: BP 140/76 (BP Location: Left Arm, Patient Position: Sitting, Cuff Size: Normal)   Pulse 82   Temp 97.9 F (36.6 C) (Oral)   Resp 16   Ht 5\' 9"  (1.753 m)   Wt 173 lb (78.5 kg)   SpO2 99%   BMI 25.55 kg/m   Wt Readings from Last 3 Encounters:  10/09/18 173 lb (78.5 kg)  10/01/18 179 lb (81.2 kg)  05/19/18 170 lb (77.1 kg)     Physical Exam  Constitutional: He is oriented to person, place, and time. He appears well-developed and well-nourished. No distress.  HENT:  Head: Normocephalic and atraumatic.  Eyes: Pupils are equal, round, and reactive to light. Conjunctivae and EOM are normal.  Neck: Normal range of motion.  Cardiovascular: Normal rate, regular rhythm, normal heart sounds and intact distal pulses.  Pulmonary/Chest: Effort normal and breath sounds normal. No respiratory distress.  Abdominal: Soft. Bowel sounds are  normal. He exhibits no distension.  Musculoskeletal: Normal range of motion. He exhibits tenderness (bilateral tenderness with palpation of shoulders and cervical spine. Tightness of muscles noted throughout the shoulders and spine. ).  Neurological: He is alert and oriented to person, place, and time.  Skin: Skin is warm and dry.  Psychiatric: He has a normal mood and affect. His behavior is normal. Thought content normal.  Nursing note and vitals reviewed.   ASSESSMENT/PLAN:   1. Neuropathy Increased neurontin to 300mg  TID.  - gabapentin (NEURONTIN) 300 MG capsule; Take 1 capsule (300 mg total) by mouth 3 (three) times daily.  Dispense: 90 capsule; Refill: 2  2. Uncontrolled type 2 diabetes mellitus with hyperglycemia (HCC) - Insulin Glargine (LANTUS SOLOSTAR) 100 UNIT/ML Solostar Pen; Inject 50 Units into the skin at bedtime.  Dispense: 15 mL; Refill: 3  3. Type 2 diabetes mellitus without complication, with long-term current use of insulin (HCC) - Insulin Glargine (LANTUS SOLOSTAR) 100 UNIT/ML Solostar Pen; Inject 50 Units into the skin at bedtime.  Dispense: 15 mL; Refill: 3 - metFORMIN (GLUCOPHAGE) 1000 MG tablet; Take 1 tablet (1,000 mg total) by mouth 2 (two) times daily with a meal.  Dispense: 60 tablet; Refill: 5  4. Essential hypertension - lisinopril (PRINIVIL,ZESTRIL) 20 MG tablet; Take 1 tablet (20 mg total) by mouth daily.  Dispense: 30 tablet; Refill: 5  5. Other hyperlipidemia - simvastatin (ZOCOR) 20 MG tablet; Take 1 tablet (20 mg total) by mouth daily at 6 PM.  Dispense: 90 tablet; Refill:  2  6. Chronic pain of both shoulders - Ambulatory referral to Physical Medicine Rehab - ketorolac (TORADOL) injection 60 mg  7. Muscle spasm - cyclobenzaprine (FLEXERIL) 10 MG tablet; Take 1 tablet (10 mg total) by mouth 3 (three) times daily as needed for up to 10 days for muscle spasms.  Dispense: 30 tablet; Refill: 0  8. Chronic bilateral low back pain without sciatica -  Ambulatory referral to Physical Medicine Rehab  Refilled medications. Patient will need follow up for chronic conditions and lab work.       The patient was given clear instructions to go to ER or return to medical center if symptoms do not improve, worsen or new problems develop. The patient verbalized understanding and agreed with plan of care.   Dean Robertson. Dean Canary, FNP-BC [facility] [address] [place], [place] [address] [phone]     This note has been created with Dragon speech recognition software and smart phrase technology. Any transcriptional errors are unintentional.

## 2018-10-09 NOTE — Telephone Encounter (Signed)
Referral placed Tell him he left his hat here.

## 2018-10-10 NOTE — Telephone Encounter (Signed)
Called and left a message that referral was placed and that he left his hat. Thanks!

## 2018-10-27 ENCOUNTER — Telehealth: Payer: Self-pay

## 2018-10-27 NOTE — Telephone Encounter (Signed)
Called, no answer. Left a message for patient to call back.  

## 2018-10-27 NOTE — Telephone Encounter (Signed)
Patient was referred to physical med and rehab for this. They will order if necessary.

## 2018-10-27 NOTE — Telephone Encounter (Signed)
Called and spoke with patient he states he was going to get an MRI of his right shoulder. I don't see an order for this. Please advise if this needs to be done. Thanks!

## 2018-10-27 NOTE — Telephone Encounter (Signed)
Called and spoke with patient, advised that we have sent in referral to physical medicine rehab and they are reviewing his chart and will order this if needed once he is established with their facility.

## 2018-11-06 ENCOUNTER — Ambulatory Visit (INDEPENDENT_AMBULATORY_CARE_PROVIDER_SITE_OTHER): Payer: Commercial Managed Care - PPO | Admitting: Family Medicine

## 2018-11-06 ENCOUNTER — Encounter: Payer: Self-pay | Admitting: Family Medicine

## 2018-11-06 VITALS — BP 142/96 | HR 89 | Temp 98.1°F | Resp 16 | Ht 69.0 in | Wt 173.0 lb

## 2018-11-06 DIAGNOSIS — R300 Dysuria: Secondary | ICD-10-CM | POA: Diagnosis not present

## 2018-11-06 DIAGNOSIS — Z114 Encounter for screening for human immunodeficiency virus [HIV]: Secondary | ICD-10-CM

## 2018-11-06 DIAGNOSIS — M25511 Pain in right shoulder: Secondary | ICD-10-CM

## 2018-11-06 DIAGNOSIS — Z23 Encounter for immunization: Secondary | ICD-10-CM | POA: Diagnosis not present

## 2018-11-06 DIAGNOSIS — Z125 Encounter for screening for malignant neoplasm of prostate: Secondary | ICD-10-CM | POA: Diagnosis not present

## 2018-11-06 DIAGNOSIS — Z1211 Encounter for screening for malignant neoplasm of colon: Secondary | ICD-10-CM

## 2018-11-06 DIAGNOSIS — Z955 Presence of coronary angioplasty implant and graft: Secondary | ICD-10-CM | POA: Diagnosis not present

## 2018-11-06 LAB — POCT URINALYSIS DIPSTICK
Bilirubin, UA: NEGATIVE
Blood, UA: NEGATIVE
Glucose, UA: POSITIVE — AB
Ketones, UA: NEGATIVE
Leukocytes, UA: NEGATIVE
Nitrite, UA: NEGATIVE
Protein, UA: POSITIVE — AB
Spec Grav, UA: 1.02 (ref 1.010–1.025)
Urobilinogen, UA: 0.2 E.U./dL
pH, UA: 6 (ref 5.0–8.0)

## 2018-11-06 NOTE — Patient Instructions (Signed)
Benign Prostatic Hyperplasia ° °Benign prostatic hyperplasia (BPH) is an enlarged prostate gland that is caused by the normal aging process and not by cancer. The prostate is a walnut-sized gland that is involved in the production of semen. It is located in front of the rectum and below the bladder. The bladder stores urine and the urethra is the tube that carries the urine out of the body. The prostate may get bigger as a man gets older. °An enlarged prostate can press on the urethra. This can make it harder to pass urine. The build-up of urine in the bladder can cause infection. Back pressure and infection may progress to bladder damage and kidney (renal) failure. °What are the causes? °This condition is part of a normal aging process. However, not all men develop problems from this condition. If the prostate enlarges away from the urethra, urine flow will not be blocked. If it enlarges toward the urethra and compresses it, there will be problems passing urine. °What increases the risk? °This condition is more likely to develop in men over the age of 50 years. °What are the signs or symptoms? °Symptoms of this condition include: °· Getting up often during the night to urinate. °· Needing to urinate frequently during the day. °· Difficulty starting urine flow. °· Decrease in size and strength of your urine stream. °· Leaking (dribbling) after urinating. °· Inability to pass urine. This needs immediate treatment. °· Inability to completely empty your bladder. °· Pain when you pass urine. This is more common if there is also an infection. °· Urinary tract infection (UTI). °How is this diagnosed? °This condition is diagnosed based on your medical history, a physical exam, and your symptoms. Tests will also be done, such as: °· A post-void bladder scan. This measures any amount of urine that may remain in your bladder after you finish urinating. °· A digital rectal exam. In a rectal exam, your health care provider  checks your prostate by putting a lubricated, gloved finger into your rectum to feel the back of your prostate gland. This exam detects the size of your gland and any abnormal lumps or growths. °· An exam of your urine (urinalysis). °· A prostate specific antigen (PSA) screening. This is a blood test used to screen for prostate cancer. °· An ultrasound. This test uses sound waves to electronically produce a picture of your prostate gland. °Your health care provider may refer you to a specialist in kidney and prostate diseases (urologist). °How is this treated? °Once symptoms begin, your health care provider will monitor your condition (active surveillance or watchful waiting). Treatment for this condition will depend on the severity of your condition. Treatment may include: °· Observation and yearly exams. This may be the only treatment needed if your condition and symptoms are mild. °· Medicines to relieve your symptoms, including: °? Medicines to shrink the prostate. °? Medicines to relax the muscle of the prostate. °· Surgery in severe cases. Surgery may include: °? Prostatectomy. In this procedure, the prostate tissue is removed completely through an open incision or with a laparascope or robotics. °? Transurethral resection of the prostate (TURP). In this procedure, a tool is inserted through the opening at the tip of the penis (urethra). It is used to cut away tissue of the inner core of the prostate. The pieces are removed through the same opening of the penis. This removes the blockage. °? Transurethral incision (TUIP). In this procedure, small cuts are made in the prostate. This lessens   the prostate's pressure on the urethra. ? Transurethral microwave thermotherapy (TUMT). This procedure uses microwaves to create heat. The heat destroys and removes a small amount of prostate tissue. ? Transurethral needle ablation (TUNA). This procedure uses radio frequencies to destroy and remove a small amount of  prostate tissue. ? Interstitial laser coagulation ([place]). This procedure uses a laser to destroy and remove a small amount of prostate tissue. ? Transurethral electrovaporization (TUVP). This procedure uses electrodes to destroy and remove a small amount of prostate tissue. ? Prostatic urethral lift. This procedure inserts an implant to push the lobes of the prostate away from the urethra. Follow these instructions at home:  Take over-the-counter and prescription medicines only as told by your health care provider.  Monitor your symptoms for any changes. Contact your health care provider with any changes.  Avoid drinking large amounts of liquid before going to bed or out in public.  Avoid or reduce how much caffeine or alcohol you drink.  Give yourself time when you urinate.  Keep all follow-up visits as told by your health care provider. This is important. Contact a health care provider if:  You have unexplained back pain.  Your symptoms do not get better with treatment.  You develop side effects from the medicine you are taking.  Your urine becomes very dark or has a bad smell.  Your lower abdomen becomes distended and you have trouble passing your urine. Get help right away if:  You have a fever or chills.  You suddenly cannot urinate.  You feel lightheaded, or very dizzy, or you faint.  There are large amounts of blood or clots in the urine.  Your urinary problems become hard to manage.  You develop moderate to severe low back or flank pain. The flank is the side of your body between the ribs and the hip. These symptoms may represent a serious problem that is an emergency. Do not wait to see if the symptoms will go away. Get medical help right away. Call your local emergency services (911 in the [place] Do not drive yourself to the hospital. Summary  Benign prostatic hyperplasia (BPH) is an enlarged prostate that is caused by the normal aging process and not by  cancer.  An enlarged prostate can press on the urethra. This can make it hard to pass urine.  This condition is part of a normal aging process and is more likely to develop in men over the age of 45 years.  Get help right away if you suddenly cannot urinate. This information is not intended to replace advice given to you by your health care provider. Make sure you discuss any questions you have with your health care provider. Document Released: 10/22/2005 Document Revised: 11/26/2016 Document Reviewed: 11/26/2016 Elsevier Interactive Patient Education  2019 [employer] Shoulder Pain Many things can cause shoulder pain, including:  An injury.  Moving the shoulder in the same way again and again (overuse).  Joint pain (arthritis). Pain can come from:  Swelling and irritation (inflammation) of any part of the shoulder.  An injury to the shoulder joint.  An injury to: ? Tissues that connect muscle to bone (tendons). ? Tissues that connect bones to each other (ligaments). ? Bones. Follow these instructions at home: Watch for changes in your symptoms. Let your doctor know about them. Follow these instructions to help with your pain. If you have a sling:  Wear the sling as told by your doctor. Remove it only as told by your  doctor.  Loosen the sling if your fingers: ? Tingle. ? Become numb. ? Turn cold and blue.  Keep the sling clean.  If the sling is not waterproof: ? Do not let it get wet. ? Take the sling off when you shower or bathe. Managing pain, stiffness, and swelling   If told, put ice on the painful area: ? Put ice in a plastic bag. ? Place a towel between your skin and the bag. ? Leave the ice on for 20 minutes, 2-3 times a day. Stop putting ice on if it does not help with the pain.  Squeeze a soft ball or a foam pad as much as possible. This prevents swelling in the shoulder. It also helps to strengthen the arm. General instructions  Take  over-the-counter and prescription medicines only as told by your doctor.  Keep all follow-up visits as told by your doctor. This is important. Contact a doctor if:  Your pain gets worse.  Medicine does not help your pain.  You have new pain in your arm, hand, or fingers. Get help right away if:  Your arm, hand, or fingers: ? Tingle. ? Are numb. ? Are swollen. ? Are painful. ? Turn white or blue. Summary  Shoulder pain can be caused by many things. These include injury, moving the shoulder in the same away again and again, and joint pain.  Watch for changes in your symptoms. Let your doctor know about them.  This condition may be treated with a sling, ice, and pain medicine.  Contact your doctor if the pain gets worse or you have new pain. Get help right away if your arm, hand, or fingers tingle or get numb, swollen, or painful.  Keep all follow-up visits as told by your doctor. This is important. This information is not intended to replace advice given to you by your health care provider. Make sure you discuss any questions you have with your health care provider. Document Released: 04/09/2008 Document Revised: 05/06/2018 Document Reviewed: 05/06/2018 Elsevier Interactive Patient Education  2019 [employer].

## 2018-11-06 NOTE — Progress Notes (Signed)
[facility]   Progress Note: General Provider: Lanae Boast, FNP  SUBJECTIVE:   Dean Robertson is a 58 y.o. male who  has a past medical history of Coronary artery disease, Diabetes mellitus without complication ([place]), High cholesterol, and Hypertension.. Patient presents today for Follow-up (pain of shoulders/back )  right shoulder,  Patient with a history of [place] in 2011. Patient states that he had more than 80% blockage at the time of surgery. Patient states that he is unsure if the stent is still in place and would like a referral to cardiology for further evaluation. He believes that this may contribute to his right arm and shoulder pain.  He is unsure of the material that his stent is made from.  He had a card with this information but cannot find Patient states that he has noticed a decrease in the urinary stream when urinating.  He also states that he has had several episodes of dysuria.  Patient states that he has times where he has the urge to go but cannot go.  He denies history of BPH or prostate cancer. Also states that he would like to have a colonoscopy.  Denies problems or concerns at the present time. Patient is requesting an MRI of the right shoulder due to pain and decrease in range of motion. He also continues to have pain in his lumbar spine. Xray reveals possible stenosis. Has been referred to physical medicine and the appointment is pending.  Review of Systems  Constitutional: Negative.   HENT: Negative.   Eyes: Negative.   Respiratory: Negative.   Cardiovascular: Negative.   Gastrointestinal: Negative.   Genitourinary: Positive for dysuria and urgency.       Hesitancy  Musculoskeletal: Positive for joint pain (right shoulder) and myalgias.  Skin: Negative.   Neurological: Negative.   Psychiatric/Behavioral: Negative.      OBJECTIVE: BP (!) 142/96 (BP Location: Left Arm, Patient  Position: Sitting, Cuff Size: Normal)   Pulse 89   Temp 98.1 F (36.7 C) (Oral)   Resp 16   Ht 5\' 9"  (1.753 m)   Wt 173 lb (78.5 kg)   SpO2 100%   BMI 25.55 kg/m   Wt Readings from Last 3 Encounters:  11/06/18 173 lb (78.5 kg)  10/09/18 173 lb (78.5 kg)  10/01/18 179 lb (81.2 kg)     Physical Exam  ASSESSMENT/PLAN:  1. Right shoulder pain, unspecified chronicity Patient referred to physical medicine for further evaluation. Will order MRI.  - MR SHOULDER RIGHT W WO CONTRAST; Future  2. H/O heart artery stent Referred for follow up. Has been seen in 2012 by cardiologist and is unsure of the name.  - Ambulatory referral to Cardiology - Basic Metabolic Panel  3. Screen for colon cancer - Ambulatory referral to Gastroenterology - HM COLONOSCOPY  4. Screening for malignant neoplasm of prostate Patient declined DRE and would prefer blood work. Pending labs. Will adjust medications accordingly.    - PSA  5. Screening for HIV (human immunodeficiency virus) - HIV antibody (with reflex)  6. Dysuria - Urinalysis Dipstick - GC/Chlamydia Probe Amp([facility])  7. Need for immunization against influenza - Flu Vaccine QUAD 36+ mos IM    Return in about 3 months (around 02/05/2019), or if symptoms worsen or fail to improve.    The patient was given clear instructions to go to ER or return to medical center if symptoms do not improve, worsen or new  problems develop. The patient verbalized understanding and agreed with plan of care.   Ms. Doug Sou. Nathaneil Canary, FNP-BC [facility] [address] [place], [place] [address] [phone]

## 2018-11-07 LAB — BASIC METABOLIC PANEL
BUN/Creatinine Ratio: 15 (ref 9–20)
BUN: 17 mg/dL (ref 6–24)
CO2: 24 mmol/L (ref 20–29)
Calcium: 9.4 mg/dL (ref 8.7–10.2)
Chloride: 99 mmol/L (ref 96–106)
Creatinine, Ser: 1.13 mg/dL (ref 0.76–1.27)
GFR calc Af Amer: 82 mL/min/{1.73_m2} (ref >59)
GFR calc non Af Amer: 71 mL/min/{1.73_m2} (ref >59)
Glucose: 168 mg/dL — ABNORMAL HIGH (ref 65–99)
Potassium: 4.4 mmol/L (ref 3.5–5.2)
Sodium: 137 mmol/L (ref 134–144)

## 2018-11-07 LAB — PSA: Prostate Specific Ag, Serum: 1 ng/mL (ref 0.0–4.0)

## 2018-11-07 LAB — HIV ANTIBODY (ROUTINE TESTING W REFLEX): HIV Screen 4th Generation wRfx: NONREACTIVE

## 2018-11-10 LAB — GC/CHLAMYDIA PROBE AMP
Chlamydia trachomatis, NAA: NEGATIVE
Neisseria gonorrhoeae by PCR: NEGATIVE

## 2018-11-11 ENCOUNTER — Telehealth: Payer: Self-pay

## 2018-11-11 NOTE — Telephone Encounter (Signed)
-----   Message from Lanae Boast, [place] sent at 11/11/2018 11:56 AM EST ----- Prostate specific antigen (PSA) was normal. Negative for HIV, gonorrhea and chlamydia. If he is still having trouble with urination, please let me know and I can refer to urology.

## 2018-11-11 NOTE — Telephone Encounter (Signed)
Called, no answer. Left a message for patient to call back. Thanks!  

## 2018-11-12 NOTE — Telephone Encounter (Signed)
Called and spoke with patient. Advised that PSA was normal, and hiv, gonorrhea, and chamydia were all negative. Informed him if he continues to have problems with urination to let us know and we will refer him to urology. He states it has gotten better since appointment. Thanks!

## 2018-11-14 ENCOUNTER — Ambulatory Visit: Payer: Commercial Managed Care - PPO | Admitting: Cardiology

## 2018-11-18 ENCOUNTER — Ambulatory Visit (HOSPITAL_COMMUNITY): Admission: RE | Admit: 2018-11-18 | Payer: Commercial Managed Care - PPO | Source: Ambulatory Visit

## 2018-11-20 ENCOUNTER — Encounter: Payer: Self-pay | Admitting: Gastroenterology

## 2018-11-22 ENCOUNTER — Other Ambulatory Visit: Payer: Self-pay

## 2018-11-22 ENCOUNTER — Encounter (HOSPITAL_BASED_OUTPATIENT_CLINIC_OR_DEPARTMENT_OTHER): Payer: Self-pay | Admitting: Emergency Medicine

## 2018-11-22 ENCOUNTER — Emergency Department (HOSPITAL_BASED_OUTPATIENT_CLINIC_OR_DEPARTMENT_OTHER)
Admission: EM | Admit: 2018-11-22 | Discharge: 2018-11-22 | Disposition: A | Discharge status: Home / Self Care | Payer: Commercial Managed Care - PPO | Attending: Emergency Medicine | Admitting: Emergency Medicine

## 2018-11-22 DIAGNOSIS — I10 Essential (primary) hypertension: Secondary | ICD-10-CM | POA: Insufficient documentation

## 2018-11-22 DIAGNOSIS — I2510 Atherosclerotic heart disease of native coronary artery without angina pectoris: Secondary | ICD-10-CM | POA: Insufficient documentation

## 2018-11-22 DIAGNOSIS — Z79899 Other long term (current) drug therapy: Secondary | ICD-10-CM | POA: Insufficient documentation

## 2018-11-22 DIAGNOSIS — M25511 Pain in right shoulder: Secondary | ICD-10-CM | POA: Diagnosis present

## 2018-11-22 DIAGNOSIS — G8929 Other chronic pain: Secondary | ICD-10-CM | POA: Diagnosis not present

## 2018-11-22 DIAGNOSIS — Z794 Long term (current) use of insulin: Secondary | ICD-10-CM | POA: Insufficient documentation

## 2018-11-22 DIAGNOSIS — Z7982 Long term (current) use of aspirin: Secondary | ICD-10-CM | POA: Diagnosis not present

## 2018-11-22 DIAGNOSIS — E119 Type 2 diabetes mellitus without complications: Secondary | ICD-10-CM | POA: Diagnosis not present

## 2018-11-22 DIAGNOSIS — Z955 Presence of coronary angioplasty implant and graft: Secondary | ICD-10-CM | POA: Diagnosis not present

## 2018-11-22 MED ORDER — CYCLOBENZAPRINE HCL 10 MG PO TABS: 10.0000 mg | ORAL_TABLET | Freq: Two times a day (BID) | ORAL | 0 refills | Status: DC | PRN

## 2018-11-22 MED ORDER — CYCLOBENZAPRINE HCL 10 MG PO TABS
10.0000 mg | ORAL_TABLET | Freq: Once | ORAL | Status: AC
  Administered 2018-11-22: 10 mg via ORAL
  Filled 2018-11-22: qty 1

## 2018-11-22 NOTE — ED Provider Notes (Signed)
[facility] EMERGENCY DEPARTMENT Provider Note   CSN: [id number] Arrival date & time: 11/22/18  1017     History   Chief Complaint Chief Complaint  Patient presents with  . Shoulder Pain    HPI Dean Robertson is a 58 y.o. male.  Dean Robertson is a 58 y.o. male with a history of hypertension, hyperlipidemia, diabetes, CAD and chronic bilateral shoulder pain, who presents to the emergency department for evaluation of right shoulder pain which is been present for years but recently worsening.  He denies any new injury or trauma.  Pain is a constant dull ache that is worse with movement, pain is often worse at night if he sleeps on his shoulder.  He denies any numbness weakness or tingling in the arm.  No swelling, no overlying skin changes.  He has been following with his primary care doctor regarding this and was recently seen earlier this month and is having an MRI scheduled, per chart review patient has also been referred to physical medicine and rehab but has not yet had an appointment with them.  He reports that the only thing that seems to give him relief from the shoulder pain is Flexeril and when he has had steroid injections in the past.  Denies any other aggravating or alleviating factors.  No associated chest pain or shortness of breath.  No acute worsening or change in his pain today causing him to present.     Past Medical History:  Diagnosis Date  . Coronary artery disease   . Diabetes mellitus without complication ([place])   . High cholesterol   . Hypertension     Patient Active Problem List   Diagnosis Date Noted  . Essential hypertension 12/08/2017  . Type 2 diabetes mellitus without complication, with long-term current use of insulin ([place]) 12/08/2017  . Chest pain 10/04/2017  . ASHD (arteriosclerotic heart disease) 11/15/2015  . Hyperlipidemia 11/15/2015  . Neuropathy 11/15/2015  . Sprain/strain 11/27/2013    Past Surgical History:  Procedure  Laterality Date  . CORONARY ANGIOPLASTY WITH STENT PLACEMENT          Home Medications    Prior to Admission medications   Medication Sig Start Date End Date Taking? Authorizing Provider  acetaminophen (TYLENOL) 500 MG tablet Take 1 tablet (500 mg total) by mouth every 6 (six) hours as needed. 12/08/17   Dorena Dew, FNP  aspirin EC 81 MG tablet Take 81 mg by mouth daily.    [provider]  Blood Glucose Monitoring Suppl (TRUE METRIX AIR GLUCOSE METER) w/Device KIT 1 each 3 (three) times daily by Does not apply route. 09/19/17   Dorena Dew, FNP  cyclobenzaprine (FLEXERIL) 10 MG tablet Take 1 tablet (10 mg total) by mouth 2 (two) times daily as needed for muscle spasms. 11/22/18   Jacqlyn Larsen, PA-C  gabapentin (NEURONTIN) 300 MG capsule Take 1 capsule (300 mg total) by mouth 3 (three) times daily. 10/09/18   Lanae Boast, FNP  glucose blood (TRUE METRIX BLOOD GLUCOSE TEST) test strip Use as instructed 05/19/18   Lanae Boast, FNP  Insulin Glargine (LANTUS SOLOSTAR) 100 UNIT/ML Solostar Pen Inject 50 Units into the skin at bedtime. 10/09/18   Lanae Boast, FNP  Lancets MISC 1 each 3 (three) times daily by Does not apply route. 09/19/17   Dorena Dew, FNP  lisinopril (PRINIVIL,ZESTRIL) 20 MG tablet Take 1 tablet (20 mg total) by mouth daily. 10/09/18   Lanae Boast, FNP  Menthol, Topical  Analgesic, (BIOFREEZE) 4 % GEL Apply 1 application topically 4 (four) times daily as needed. 10/09/18   Lanae Boast, FNP  metFORMIN (GLUCOPHAGE) 1000 MG tablet Take 1 tablet (1,000 mg total) by mouth 2 (two) times daily with a meal. 10/09/18   Lanae Boast, FNP  Multiple Vitamins-Minerals (VITRUM SENIOR) TABS Take by mouth.    [provider]  propranolol (INDERAL) 40 MG tablet Take 1 tablet (40 mg total) by mouth 2 (two) times daily. Patient not taking: Reported on 05/19/2018 01/11/18   Sherwood Gambler, MD  Saxagliptin-Metformin 2.03-999 MG TB24 Take 1 tablet BID  02/21/16   [provider]  simvastatin (ZOCOR) 20 MG tablet Take 1 tablet (20 mg total) by mouth daily at 6 PM. 10/09/18   Lanae Boast, FNP    Family History Family History  Problem Relation Age of Onset  . Hypertension Mother   . Diabetes Father     Social History Social History   Tobacco Use  . Smoking status: Never Smoker  . Smokeless tobacco: Never Used  Substance Use Topics  . Alcohol use: No  . Drug use: No     Allergies   Patient has no known allergies.   Review of Systems Review of Systems  Constitutional: Negative for chills and fever.  Respiratory: Negative for cough and shortness of breath.   Cardiovascular: Negative for chest pain.  Musculoskeletal: Positive for arthralgias. Negative for joint swelling.  Skin: Negative for color change, rash and wound.  Neurological: Negative for weakness and numbness.     Physical Exam Updated Vital Signs BP (!) 142/80 (BP Location: Left Arm)   Pulse 75   Temp 97.9 F (36.6 C) (Oral)   Resp 16   Ht _0  (1.753 m)   Wt 78.9 kg   SpO2 99%   BMI 25.70 kg/m   Physical Exam Vitals signs and nursing note reviewed.  Constitutional:      General: He is not in acute distress.    Appearance: He is well-developed. He is not diaphoretic.  HENT:     Head: Normocephalic and atraumatic.  Eyes:     General:        Right eye: No discharge.        Left eye: No discharge.  Pulmonary:     Effort: Pulmonary effort is normal. No respiratory distress.  Musculoskeletal:     Comments: No tenderness over the right shoulder, no appreciable deformity, no overlying erythema or skin changes Active range of motion intact with some pain, passive range of motion not limited. 2+ radial pulse and good capillary refill, range of motion at the elbow and wrist intact.  5/5 strength.  Skin:    General: Skin is warm and dry.     Capillary Refill: Capillary refill takes less than 2 seconds.  Neurological:     Mental Status: He  is alert and oriented to person, place, and time. Mental status is at baseline.     Coordination: Coordination normal.  Psychiatric:        Mood and Affect: Mood normal.        Behavior: Behavior normal.      ED Treatments / Results  Labs (all labs ordered are listed, but only abnormal results are displayed) Labs Reviewed - No data to display  EKG None  Radiology No results found.  Procedures Procedures (including critical care time)  Medications Ordered in ED Medications  cyclobenzaprine (FLEXERIL) tablet 10 mg (10 mg Oral Given 11/22/18 1145)  Initial Impression / Assessment and Plan / ED Course  I have reviewed the triage vital signs and the nursing notes.  Pertinent labs & imaging results that were available during my care of the patient were reviewed by me and considered in my medical decision making (see chart for details).  Presents for evaluation of his chronic right shoulder pain.  This is not acutely changed or worsened and he denies any associated numbness or weakness, no swelling in the arm.  No chest pain or shortness of breath.  Patient has been evaluated by his PCP earlier this month for similar pain and is scheduled for outpatient MRI has also been referred to physical medicine and rehab for further evaluation.  Has had multiple x-rays that show some degenerative changes and bone spurring but no acute change.  Patient has not had any trauma or new injury and I do not think repeat imaging is indicated at this time.  Reports he is out of his Flexeril which has helped relieve this pain in the past.  Will give dose of Flexeril and provide short-term refill encourage patient to follow-up with PCP and physical medicine and rehab for further evaluation and proceed with outpatient MRI.  Discussed with patient that we are limited in what we can do for this chronic pain from the emergency department.  Patient expresses understanding and agreement with plan.  Discharged home  in good condition.  Final Clinical Impressions(s) / ED Diagnoses   Final diagnoses:  Chronic right shoulder pain    ED Discharge Orders         Ordered    cyclobenzaprine (FLEXERIL) 10 MG tablet  2 times daily PRN     11/22/18 1139           Jacqlyn Larsen, [place] 11/22/18 1151    Drenda Freeze, MD 11/22/18 [phone]

## 2018-11-22 NOTE — Discharge Instructions (Signed)
Continue following up with your regular doctor regarding her chronic shoulder pain, you can take Flexeril as needed for pain, do not take before driving as this can cause drowsiness.  It looks as though they have been working on getting you scheduled for an MRI and them also referred you to physical medicine and rehab, if you not been contacted about scheduling this appointment touch base with your primary care doctor regarding this.

## 2018-11-22 NOTE — ED Triage Notes (Signed)
R shoulder pain for over a year, getting worse. Denies injury.

## 2018-11-26 ENCOUNTER — Telehealth: Payer: Self-pay | Admitting: *Deleted

## 2018-11-26 NOTE — Telephone Encounter (Signed)
Dr Bryan Lemma,  This pt is scheduled for a direct colon with you on 2-10. The Appt note states colon 10+ yrs ago.  In Care Every where, I found a GI note from Dr Marin Comment, Fabienne Bruns at [facility] from 2016 that states pt had a normal colon in 2012, but there is no colon report in Care Every where.  At that 2016 GI OV, he ordered a CT scan that was normal for Abd pain.  No GI office visits with [place].  Do you want to proceed with a colon as scheduled as a screening per the referral from the PCP since we do not have records?  Please Advise, Thanks,Marie PV

## 2018-11-26 NOTE — Telephone Encounter (Signed)
Agreed, I can only find that a colonsocopy was reportedly completed in Aug 2012, but there's no report to review. In the absence of locating that report for review, I too would err on the side of caution and repeat colonoscopy for ongoing screening, provided this would be a covered study for him. Thanks.

## 2018-11-26 NOTE — Telephone Encounter (Signed)
Thanks-- will proceed as schedule as screening d. Lelan Pons PV

## 2018-12-01 ENCOUNTER — Ambulatory Visit (AMBULATORY_SURGERY_CENTER): Payer: Self-pay | Admitting: *Deleted

## 2018-12-01 ENCOUNTER — Telehealth: Payer: Self-pay | Admitting: *Deleted

## 2018-12-01 VITALS — Ht 68.0 in | Wt 174.0 lb

## 2018-12-01 DIAGNOSIS — Z1211 Encounter for screening for malignant neoplasm of colon: Secondary | ICD-10-CM

## 2018-12-01 MED ORDER — NA SULFATE-K SULFATE-MG SULF 17.5-3.13-1.6 GM/177ML PO SOLN: 1.0000 | Freq: Once | ORAL | 0 refills | Status: AC

## 2018-12-01 NOTE — Progress Notes (Signed)
No egg or soy allergy known to patient  No issues with past sedation with any surgeries  or procedures, no intubation problems  No diet pills per patient No home 02 use per patient  No blood thinners per patient  Pt denies issues with constipation  No A fib or A flutter  EMMI video sent to pt's e mail -  Suprep $15 coupon to pt in pV today  Pt has a cardio referral placed 11-06-2018 by PCP- Pt states he has no chest pain, no A Fib or Flutter- ha has a past hx of Stent in 2011 at [facility] but no issues since- He- pt thinks is a follow up for this- pt explained we may have to cancel colon until after cardiac clearance I will send TE to Dr Bryan Lemma   And get his opinion

## 2018-12-01 NOTE — Telephone Encounter (Signed)
Dr Bryan Lemma,  I saw Mr Dean Robertson in [place] today. Pt has a cardio referral placed 11-06-2018 by PCP- Pt states he has no chest pain, no A Fib or Flutter- he  has a past hx of Stent in 2011 at [facility] but no issues since- there are no notes from [facility] in Epic  He- (pt )thinks is a follow up for this. But he also has been having right arm pain into the shoulder. I explained to him you may want him to see cardio and have clearance and then have his colon.  Please advise,  Thanks so much for your time, Lelan Pons Pre Visit

## 2018-12-02 ENCOUNTER — Encounter: Payer: Self-pay | Admitting: Gastroenterology

## 2018-12-02 NOTE — Telephone Encounter (Signed)
Reviewed hi srecent notes and appears that he has bilateral shoulder pain, with right worse than left. Was sent by his PCM for imaging and PT for the pain along with medications. The Cardiology referral was placed for routine follow-up for history of CAD and prior stent placement. Looks like he was also seen in the ER for his shoulder pain, and discharged without concern for cardiac etiology. Based on the available notes, it appears that this is a [facility] issue and therefore should be ok to proceed with colonoscopy as scheduled.  Thanks!

## 2018-12-04 NOTE — Telephone Encounter (Signed)
Patient called.  Per Dr. Bryan Lemma, ok to proceed with colonoscopy procedure as scheduled.  See TR 12/02/2018.  Patient verbalized understanding.

## 2018-12-12 ENCOUNTER — Encounter: Payer: Self-pay | Admitting: Gastroenterology

## 2018-12-12 ENCOUNTER — Ambulatory Visit (AMBULATORY_SURGERY_CENTER): Payer: Commercial Managed Care - PPO | Admitting: Gastroenterology

## 2018-12-12 VITALS — BP 136/96 | HR 69 | Temp 96.4°F | Resp 11 | Ht 68.0 in | Wt 174.0 lb

## 2018-12-12 DIAGNOSIS — Z1211 Encounter for screening for malignant neoplasm of colon: Secondary | ICD-10-CM

## 2018-12-12 DIAGNOSIS — K621 Rectal polyp: Secondary | ICD-10-CM | POA: Diagnosis not present

## 2018-12-12 DIAGNOSIS — D128 Benign neoplasm of rectum: Secondary | ICD-10-CM

## 2018-12-12 DIAGNOSIS — K5730 Diverticulosis of large intestine without perforation or abscess without bleeding: Secondary | ICD-10-CM

## 2018-12-12 DIAGNOSIS — D129 Benign neoplasm of anus and anal canal: Secondary | ICD-10-CM

## 2018-12-12 MED ORDER — SODIUM CHLORIDE 0.9 % IV SOLN: 500.0000 mL | Freq: Once | INTRAVENOUS | Status: DC

## 2018-12-12 NOTE — Progress Notes (Signed)
Report given to PACU, vss 

## 2018-12-12 NOTE — Progress Notes (Signed)
Called to room to assist during endoscopic procedure.  Patient ID and intended procedure confirmed with present staff. Received instructions for my participation in the procedure from the performing physician.  

## 2018-12-12 NOTE — Patient Instructions (Signed)
Handouts on Polyps.  YOU HAD AN ENDOSCOPIC PROCEDURE TODAY AT [facility]:   Refer to the procedure report that was given to you for any specific questions about what was found during the examination.  If the procedure report does not answer your questions, please call your gastroenterologist to clarify.  If you requested that your care partner not be given the details of your procedure findings, then the procedure report has been included in a sealed envelope for you to review at your convenience later.  YOU SHOULD EXPECT: Some feelings of bloating in the abdomen. Passage of more gas than usual.  Walking can help get rid of the air that was put into your GI tract during the procedure and reduce the bloating. If you had a lower endoscopy (such as a colonoscopy or flexible sigmoidoscopy) you may notice spotting of blood in your stool or on the toilet paper. If you underwent a bowel prep for your procedure, you may not have a normal bowel movement for a few days.  Please Note:  You might notice some irritation and congestion in your nose or some drainage.  This is from the oxygen used during your procedure.  There is no need for concern and it should clear up in a day or so.  SYMPTOMS TO REPORT IMMEDIATELY:   Following lower endoscopy (colonoscopy or flexible sigmoidoscopy):  Excessive amounts of blood in the stool  Significant tenderness or worsening of abdominal pains  Swelling of the abdomen that is new, acute  Fever of 100F or higher  For urgent or emergent issues, a gastroenterologist can be reached at any hour by calling [phone].   DIET:  We do recommend a small meal at first, but then you may proceed to your regular diet.  Drink plenty of fluids but you should avoid alcoholic beverages for 24 hours.  ACTIVITY:  You should plan to take it easy for the rest of today and you should NOT DRIVE or use heavy machinery until tomorrow (because of the sedation medicines used  during the test).    FOLLOW UP: Our staff will call the number listed on your records the next business day following your procedure to check on you and address any questions or concerns that you may have regarding the information given to you following your procedure. If we do not reach you, we will leave a message.  However, if you are feeling well and you are not experiencing any problems, there is no need to return our call.  We will assume that you have returned to your regular daily activities without incident.  If any biopsies were taken you will be contacted by phone or by letter within the next 1-3 weeks.  Please call us at [phone] if you have not heard about the biopsies in 3 weeks.    SIGNATURES/CONFIDENTIALITY: You and/or your care partner have signed paperwork which will be entered into your electronic medical record.  These signatures attest to the fact that that the information above on your After Visit Summary has been reviewed and is understood.  Full responsibility of the confidentiality of this discharge information lies with you and/or your care-partner.

## 2018-12-12 NOTE — Op Note (Signed)
[facility] Patient Name: Dean Robertson Procedure Date: 12/12/2018 2:24 PM MRN: [id number] Endoscopist: Gerrit Heck , MD Age: 58 Referring MD:  Date of Birth: [birth year] Gender: Male Account #: [id number] Procedure:                Colonoscopy Indications:              Screening for colorectal malignant neoplasm (last                            colonoscopy was 10 years ago). Otherwise, no family                            history of colon cancer and without GI symptoms. Medicines:                Monitored Anesthesia Care Procedure:                Pre-Anesthesia Assessment:                           - Prior to the procedure, a History and Physical                            was performed, and patient medications and                            allergies were reviewed. The patient's tolerance of                            previous anesthesia was also reviewed. The risks                            and benefits of the procedure and the sedation                            options and risks were discussed with the patient.                            All questions were answered, and informed consent                            was obtained. Prior Anticoagulants: The patient has                            taken aspirin, last dose was 1 day prior to                            procedure. ASA Grade Assessment: II - A patient                            with mild systemic disease. After reviewing the                            risks and benefits, the patient was deemed in  satisfactory condition to undergo the procedure.                           After obtaining informed consent, the colonoscope                            was passed under direct vision. Throughout the                            procedure, the patient's blood pressure, pulse, and                            oxygen saturations were monitored continuously. The                            Colonoscope  was introduced through the anus and                            advanced to the the terminal ileum. The colonoscopy                            was performed without difficulty. The patient                            tolerated the procedure well. The quality of the                            bowel preparation was adequate. The terminal ileum,                            ileocecal valve, appendiceal orifice, and rectum                            were photographed. Scope In: 2:38:37 PM Scope Out: 2:54:17 PM Scope Withdrawal Time: 0 hours 13 minutes 36 seconds  Total Procedure Duration: 0 hours 15 minutes 40 seconds  Findings:                 The perianal and digital rectal examinations were                            normal.                           A 2 mm polyp was found in the rectum                            (benign-appearing lesion). The polyp was sessile.                            The polyp was removed with a cold biopsy forceps.                            Resection and retrieval were complete. Estimated  blood loss was minimal.                           A single large-mouthed diverticulum was found in                            the ascending colon.                           Retroflexion in the right colon was performed. The                            remainder of the colon was otherwise normal                            appearing.                           The retroflexed view of the distal rectum and anal                            verge was normal and showed no anal or rectal                            abnormalities.                           The terminal ileum appeared normal. Complications:            No immediate complications. Estimated Blood Loss:     Estimated blood loss was minimal. Impression:               - One benign appearing 2 mm polyp in the rectum,                            removed with a cold biopsy forceps. Resected and                             retrieved.                           - Diverticulosis in the ascending colon.                           - The distal rectum and anal verge are normal on                            retroflexion view.                           - The examined portion of the ileum was normal. Recommendation:           - Patient has a contact number available for                            emergencies. The signs and symptoms of potential  delayed complications were discussed with the                            patient. Return to normal activities tomorrow.                            Written discharge instructions were provided to the                            patient.                           - Resume previous diet today.                           - Continue present medications.                           - Await pathology results.                           - Repeat colonoscopy in 5-10 years for surveillance                            based on pathology results.                           - Return to GI office PRN. Gerrit Heck, MD 12/12/2018 3:01:24 PM

## 2018-12-15 ENCOUNTER — Encounter: Payer: Commercial Managed Care - PPO | Admitting: Gastroenterology

## 2018-12-15 ENCOUNTER — Telehealth: Payer: Self-pay

## 2018-12-15 NOTE — Telephone Encounter (Signed)
Left message on answering machine. 

## 2018-12-18 ENCOUNTER — Encounter: Payer: Self-pay | Admitting: Gastroenterology

## 2018-12-23 ENCOUNTER — Other Ambulatory Visit: Payer: Self-pay | Admitting: Family Medicine

## 2018-12-23 MED FILL — LISINOPRIL 20 MG TAB: 20 | 30 days supply | Qty: 30 | Fill #1

## 2018-12-23 MED FILL — GABAPENTIN 300 MG CAPSULE: 300 | 30 days supply | Qty: 90 | Fill #1

## 2018-12-23 MED FILL — LANTUS SOLOSTAR 100 UNITS/M: 100 | 30 days supply | Qty: 15 | Fill #1

## 2018-12-23 MED FILL — metFORMIN HCL 1000 MG TABS: 1000 | 30 days supply | Qty: 60 | Fill #1

## 2019-01-09 ENCOUNTER — Emergency Department (HOSPITAL_BASED_OUTPATIENT_CLINIC_OR_DEPARTMENT_OTHER)
Admission: EM | Admit: 2019-01-09 | Discharge: 2019-01-09 | Disposition: A | Discharge status: Home / Self Care | Payer: Commercial Managed Care - PPO | Attending: Emergency Medicine | Admitting: Emergency Medicine

## 2019-01-09 ENCOUNTER — Encounter (HOSPITAL_BASED_OUTPATIENT_CLINIC_OR_DEPARTMENT_OTHER): Payer: Self-pay | Admitting: *Deleted

## 2019-01-09 ENCOUNTER — Other Ambulatory Visit: Payer: Self-pay

## 2019-01-09 DIAGNOSIS — Z7982 Long term (current) use of aspirin: Secondary | ICD-10-CM | POA: Insufficient documentation

## 2019-01-09 DIAGNOSIS — I2510 Atherosclerotic heart disease of native coronary artery without angina pectoris: Secondary | ICD-10-CM | POA: Diagnosis not present

## 2019-01-09 DIAGNOSIS — S0501XA Injury of conjunctiva and corneal abrasion without foreign body, right eye, initial encounter: Secondary | ICD-10-CM | POA: Diagnosis not present

## 2019-01-09 DIAGNOSIS — M25511 Pain in right shoulder: Secondary | ICD-10-CM | POA: Diagnosis not present

## 2019-01-09 DIAGNOSIS — Y929 Unspecified place or not applicable: Secondary | ICD-10-CM | POA: Insufficient documentation

## 2019-01-09 DIAGNOSIS — E119 Type 2 diabetes mellitus without complications: Secondary | ICD-10-CM | POA: Diagnosis not present

## 2019-01-09 DIAGNOSIS — I10 Essential (primary) hypertension: Secondary | ICD-10-CM | POA: Insufficient documentation

## 2019-01-09 DIAGNOSIS — Y9301 Activity, walking, marching and hiking: Secondary | ICD-10-CM | POA: Diagnosis not present

## 2019-01-09 DIAGNOSIS — Z79899 Other long term (current) drug therapy: Secondary | ICD-10-CM | POA: Diagnosis not present

## 2019-01-09 DIAGNOSIS — Z955 Presence of coronary angioplasty implant and graft: Secondary | ICD-10-CM | POA: Insufficient documentation

## 2019-01-09 DIAGNOSIS — Z794 Long term (current) use of insulin: Secondary | ICD-10-CM | POA: Insufficient documentation

## 2019-01-09 DIAGNOSIS — W0110XA Fall on same level from slipping, tripping and stumbling with subsequent striking against unspecified object, initial encounter: Secondary | ICD-10-CM | POA: Diagnosis not present

## 2019-01-09 DIAGNOSIS — G8929 Other chronic pain: Secondary | ICD-10-CM | POA: Insufficient documentation

## 2019-01-09 DIAGNOSIS — Y999 Unspecified external cause status: Secondary | ICD-10-CM | POA: Diagnosis not present

## 2019-01-09 MED ORDER — TOBRAMYCIN 0.3 % OP SOLN: 2.0000 [drp] | OPHTHALMIC | 0 refills | Status: DC

## 2019-01-09 MED ORDER — FLUORESCEIN SODIUM 1 MG OP STRP
ORAL_STRIP | OPHTHALMIC | Status: AC
  Administered 2019-01-09: 10:00:00
  Filled 2019-01-09: qty 1

## 2019-01-09 MED ORDER — TETRACAINE HCL 0.5 % OP SOLN
OPHTHALMIC | Status: AC
  Administered 2019-01-09: 10:00:00
  Filled 2019-01-09: qty 4

## 2019-01-09 NOTE — ED Notes (Signed)
ED Provider at bedside. 

## 2019-01-09 NOTE — ED Provider Notes (Signed)
[facility] EMERGENCY DEPARTMENT Provider Note   CSN: [id number] Arrival date & time: 01/09/19  0254    History   Chief Complaint Chief Complaint  Patient presents with  . Eye Irritation    HPI Dean Robertson is a 58 y.o. male.     HPI 58 year old male presents the emergency department complaints of right eye discomfort over the past 48 hours.  He feels like he got something in his eye and began having sudden pain in his right eye.  It feels irritated.  It tears.  He does not wear contact lenses.  He wears corrective lenses.  No change in his vision.  No vomiting.  No purulent discharge.  Symptoms are mild in severity.   Past Medical History:  Diagnosis Date  . Anxiety   . Arthritis    shoulders  . Coronary artery disease   . Diabetes mellitus without complication ([place])   . H/O heart artery stent    x1  . High cholesterol   . Hypertension     Patient Active Problem List   Diagnosis Date Noted  . Essential hypertension 12/08/2017  . Type 2 diabetes mellitus without complication, with long-term current use of insulin ([place]) 12/08/2017  . Chest pain 10/04/2017  . ASHD (arteriosclerotic heart disease) 11/15/2015  . Hyperlipidemia 11/15/2015  . Neuropathy 11/15/2015  . Sprain/strain 11/27/2013    Past Surgical History:  Procedure Laterality Date  . COLONOSCOPY     2012- normal   . CORONARY ANGIOPLASTY WITH STENT PLACEMENT          Home Medications    Prior to Admission medications   Medication Sig Start Date End Date Taking? Authorizing Provider  aspirin EC 81 MG tablet Take 81 mg by mouth daily.   Yes [provider]  cyclobenzaprine (FLEXERIL) 10 MG tablet Take 1 tablet (10 mg total) by mouth 2 (two) times daily as needed for muscle spasms. 11/22/18  Yes Jacqlyn Larsen, PA-C  gabapentin (NEURONTIN) 300 MG capsule Take 1 capsule (300 mg total) by mouth 3 (three) times daily. 10/09/18  Yes Lanae Boast, FNP  Insulin Glargine (LANTUS  SOLOSTAR) 100 UNIT/ML Solostar Pen Inject 50 Units into the skin at bedtime. 10/09/18  Yes Lanae Boast, FNP  lisinopril (PRINIVIL,ZESTRIL) 20 MG tablet Take 1 tablet (20 mg total) by mouth daily. 10/09/18  Yes Lanae Boast, FNP  metFORMIN (GLUCOPHAGE) 1000 MG tablet Take 1 tablet (1,000 mg total) by mouth 2 (two) times daily with a meal. 10/09/18  Yes Lanae Boast, FNP  Multiple Vitamins-Minerals (VITRUM SENIOR) TABS Take by mouth.   Yes [provider]  simvastatin (ZOCOR) 20 MG tablet Take 1 tablet (20 mg total) by mouth daily at 6 PM. 10/09/18  Yes Lanae Boast, FNP  acetaminophen (TYLENOL) 500 MG tablet Take 1 tablet (500 mg total) by mouth every 6 (six) hours as needed. 12/08/17   Dorena Dew, FNP  Blood Glucose Monitoring Suppl (TRUE METRIX AIR GLUCOSE METER) w/Device KIT 1 each 3 (three) times daily by Does not apply route. 09/19/17   Dorena Dew, FNP  glucose blood (TRUE METRIX BLOOD GLUCOSE TEST) test strip Use as instructed 05/19/18   Lanae Boast, FNP  Lancets MISC 1 each 3 (three) times daily by Does not apply route. 09/19/17   Dorena Dew, FNP  Menthol, Topical Analgesic, (BIOFREEZE) 4 % GEL Apply 1 application topically 4 (four) times daily as needed. 10/09/18   Lanae Boast, FNP  propranolol (INDERAL) 40 MG tablet  Take 1 tablet (40 mg total) by mouth 2 (two) times daily. 01/11/18   Sherwood Gambler, MD  Saxagliptin-Metformin 2.03-999 MG TB24 Take 1 tablet BID 02/21/16   [provider]  tobramycin (TOBREX) 0.3 % ophthalmic solution Place 2 drops into the right eye every 4 (four) hours for 7 days. 01/09/19 01/16/19  Jola Schmidt, MD  traMADol (ULTRAM) 50 MG tablet Take by mouth. 06/20/16   [provider]    Family History Family History  Problem Relation Age of Onset  . Hypertension Mother   . Diabetes Father   . Colon polyps Neg Hx   . Colon cancer Neg Hx   . Esophageal cancer Neg Hx   . Rectal cancer Neg Hx   . Stomach cancer Neg Hx      Social History Social History   Tobacco Use  . Smoking status: Former Research scientist (life sciences)  . Smokeless tobacco: Never Used  . Tobacco comment: as teenager  Substance Use Topics  . Alcohol use: Yes    Comment: occ glass of wine   . Drug use: No     Allergies   Patient has no known allergies.   Review of Systems Review of Systems  All other systems reviewed and are negative.    Physical Exam Updated Vital Signs BP (!) 148/99 (BP Location: Right Arm)   Pulse 90   Temp 98.5 F (36.9 C) (Oral)   Resp 16   Ht 5' 8"  (1.727 m)   Wt 79.4 kg   SpO2 97%   BMI 26.61 kg/m   Physical Exam Vitals signs and nursing note reviewed.  Constitutional:      Appearance: He is well-developed.  HENT:     Head: Normocephalic.  Eyes:     General: Lids are normal. Lids are everted, no foreign bodies appreciated. Vision grossly intact. Gaze aligned appropriately.        Right eye: No foreign body.     Extraocular Movements: Extraocular movements intact.     Pupils: Pupils are equal, round, and reactive to light.     Right eye: Fluorescein uptake present. Seidel exam negative.  Neck:     Musculoskeletal: Normal range of motion.  Pulmonary:     Effort: Pulmonary effort is normal.  Abdominal:     General: There is no distension.  Musculoskeletal: Normal range of motion.  Neurological:     Mental Status: He is alert and oriented to person, place, and time.      ED Treatments / Results  Labs (all labs ordered are listed, but only abnormal results are displayed) Labs Reviewed - No data to display  EKG None  Radiology No results found.  Procedures Procedures (including critical care time)  Medications Ordered in ED Medications  fluorescein 1 MG ophthalmic strip (  Given by Other 01/09/19 0940)  tetracaine (PONTOCAINE) 0.5 % ophthalmic solution (  Given by Other 01/09/19 0939)     Initial Impression / Assessment and Plan / ED Course  I have reviewed the triage vital signs and the  nursing notes.  Pertinent labs & imaging results that were available during my care of the patient were reviewed by me and considered in my medical decision making (see chart for details).        Corneal abrasion without ulceration.  Home with antibiotic drops.  Based on recent literature the pt will be discharged home with 12 hours of tetracaine for topical analgesia which has been shown to improve pain without delaying healing. (Acad  Emerg Med. 2014 Apr;21(4):374-82.)   Final Clinical Impressions(s) / ED Diagnoses   Final diagnoses:  Abrasion of right cornea, initial encounter  Chronic right shoulder pain    ED Discharge Orders         Ordered    tobramycin (TOBREX) 0.3 % ophthalmic solution  Every 4 hours     01/09/19 0947           Jola Schmidt, MD 01/09/19 [phone]

## 2019-01-09 NOTE — ED Triage Notes (Signed)
Pt stated that something fell on his right eyes 2 nights ago and it has been watery and hurt since then.

## 2019-02-05 ENCOUNTER — Encounter: Payer: Self-pay | Admitting: Family Medicine

## 2019-02-05 ENCOUNTER — Ambulatory Visit (INDEPENDENT_AMBULATORY_CARE_PROVIDER_SITE_OTHER): Payer: Commercial Managed Care - PPO | Admitting: Family Medicine

## 2019-02-05 ENCOUNTER — Other Ambulatory Visit: Payer: Self-pay

## 2019-02-05 DIAGNOSIS — E119 Type 2 diabetes mellitus without complications: Secondary | ICD-10-CM

## 2019-02-05 DIAGNOSIS — I10 Essential (primary) hypertension: Secondary | ICD-10-CM

## 2019-02-05 DIAGNOSIS — Z794 Long term (current) use of insulin: Secondary | ICD-10-CM

## 2019-02-05 DIAGNOSIS — E1165 Type 2 diabetes mellitus with hyperglycemia: Secondary | ICD-10-CM | POA: Diagnosis not present

## 2019-02-05 DIAGNOSIS — G629 Polyneuropathy, unspecified: Secondary | ICD-10-CM | POA: Diagnosis not present

## 2019-02-05 DIAGNOSIS — E7849 Other hyperlipidemia: Secondary | ICD-10-CM

## 2019-02-05 DIAGNOSIS — F419 Anxiety disorder, unspecified: Secondary | ICD-10-CM

## 2019-02-05 MED ORDER — PROPRANOLOL HCL 10 MG PO TABS: 40.0000 mg | ORAL_TABLET | Freq: Two times a day (BID) | ORAL | 2 refills | Status: DC | PRN

## 2019-02-05 MED ORDER — GABAPENTIN 300 MG PO CAPS: 300.0000 mg | ORAL_CAPSULE | Freq: Three times a day (TID) | ORAL | 2 refills | Status: DC

## 2019-02-05 MED ORDER — LISINOPRIL 20 MG PO TABS: 20.0000 mg | ORAL_TABLET | Freq: Every day | ORAL | 5 refills | Status: DC

## 2019-02-05 MED ORDER — SIMVASTATIN 20 MG PO TABS: 20.0000 mg | ORAL_TABLET | Freq: Every day | ORAL | 2 refills | Status: DC

## 2019-02-05 MED ORDER — METFORMIN HCL 1000 MG PO TABS: 1000.0000 mg | ORAL_TABLET | Freq: Two times a day (BID) | ORAL | 5 refills | Status: DC

## 2019-02-05 MED ORDER — INSULIN GLARGINE 100 UNIT/ML SOLOSTAR PEN: 50.0000 [IU] | PEN_INJECTOR | Freq: Every day | SUBCUTANEOUS | 3 refills | Status: DC

## 2019-02-05 MED ORDER — CYCLOBENZAPRINE HCL 10 MG PO TABS: 10.0000 mg | ORAL_TABLET | Freq: Two times a day (BID) | ORAL | 0 refills | Status: DC | PRN

## 2019-02-05 NOTE — Progress Notes (Signed)
  Patient [facility]  Virtual Visit via Telephone Note  I connected with Dean Robertson on 02/05/19 at  3:00 PM EDT by telephone and verified that I am speaking with the correct person using two identifiers.   I discussed the limitations, risks, security and privacy concerns of performing an evaluation and management service by telephone and the availability of in person appointments. I also discussed with the patient that there may be a patient responsible charge related to this service. The patient expressed understanding and agreed to proceed.   History of Present Illness: Patient seen in the ED on 01/09/2019 for abrasion of the right cornea. Treated with tobramycin with resolution.  Patient with a history of HTN and DM. He states that he checks his FBS intermittently with max being 200. He reports needing refills on medications. He also states that he would like something for his nerves. He states that he has a pill that he takes for this. Unsure of the name. Patient denies chest pain, SOB, dizziness, and leg swelling.     Observations/Objective: Patient with regular voice tone, rate and rhythm. Speaking calmly and is in no apparent distress.     Assessment and Plan: 1. Neuropathy - gabapentin (NEURONTIN) 300 MG capsule; Take 1 capsule (300 mg total) by mouth 3 (three) times daily.  Dispense: 90 capsule; Refill: 2  2. Type 2 diabetes mellitus without complication, with long-term current use of insulin ([facility]) - metFORMIN (GLUCOPHAGE) 1000 MG tablet; Take 1 tablet (1,000 mg total) by mouth 2 (two) times daily with a meal.  Dispense: 60 tablet; Refill: 5 - Insulin Glargine (LANTUS SOLOSTAR) 100 UNIT/ML Solostar Pen; Inject 50 Units into the skin at bedtime.  Dispense: 15 mL; Refill: 3  3. Uncontrolled type 2 diabetes mellitus with hyperglycemia (HCC) - Insulin Glargine (LANTUS SOLOSTAR) 100 UNIT/ML Solostar Pen; Inject 50 Units into the skin at  bedtime.  Dispense: 15 mL; Refill: 3  4. Essential hypertension - lisinopril (PRINIVIL,ZESTRIL) 20 MG tablet; Take 1 tablet (20 mg total) by mouth daily.  Dispense: 30 tablet; Refill: 5  5. Other hyperlipidemia - simvastatin (ZOCOR) 20 MG tablet; Take 1 tablet (20 mg total) by mouth daily at 6 PM.  Dispense: 90 tablet; Refill: 2  6. Anxiety Propranolol 10mg  Q12h prn anxiety  Follow Up Instructions:    I discussed the assessment and treatment plan with the patient. The patient was provided an opportunity to ask questions and all were answered. The patient agreed with the plan and demonstrated an understanding of the instructions.   The patient was advised to call back or seek an in-person evaluation if the symptoms worsen or if the condition fails to improve as anticipated.  I provided 15 minutes of non-face-to-face time during this encounter.  Ms. Dean L. Nathaneil Canary, FNP-BC Patient [facility] [address] [place], [place] [address] [phone]

## 2019-03-23 ENCOUNTER — Other Ambulatory Visit: Payer: Self-pay

## 2019-03-23 ENCOUNTER — Emergency Department (HOSPITAL_BASED_OUTPATIENT_CLINIC_OR_DEPARTMENT_OTHER)
Admission: EM | Admit: 2019-03-23 | Discharge: 2019-03-23 | Disposition: A | Discharge status: Home / Self Care | Payer: Commercial Managed Care - PPO | Attending: Emergency Medicine | Admitting: Emergency Medicine

## 2019-03-23 ENCOUNTER — Encounter (HOSPITAL_BASED_OUTPATIENT_CLINIC_OR_DEPARTMENT_OTHER): Payer: Self-pay | Admitting: *Deleted

## 2019-03-23 DIAGNOSIS — Z7982 Long term (current) use of aspirin: Secondary | ICD-10-CM | POA: Insufficient documentation

## 2019-03-23 DIAGNOSIS — Z955 Presence of coronary angioplasty implant and graft: Secondary | ICD-10-CM | POA: Diagnosis not present

## 2019-03-23 DIAGNOSIS — E1140 Type 2 diabetes mellitus with diabetic neuropathy, unspecified: Secondary | ICD-10-CM | POA: Diagnosis not present

## 2019-03-23 DIAGNOSIS — S60459A Superficial foreign body of unspecified finger, initial encounter: Secondary | ICD-10-CM

## 2019-03-23 DIAGNOSIS — Z794 Long term (current) use of insulin: Secondary | ICD-10-CM | POA: Diagnosis not present

## 2019-03-23 DIAGNOSIS — Y9389 Activity, other specified: Secondary | ICD-10-CM | POA: Diagnosis not present

## 2019-03-23 DIAGNOSIS — Y929 Unspecified place or not applicable: Secondary | ICD-10-CM | POA: Diagnosis not present

## 2019-03-23 DIAGNOSIS — Z79899 Other long term (current) drug therapy: Secondary | ICD-10-CM | POA: Insufficient documentation

## 2019-03-23 DIAGNOSIS — S6991XA Unspecified injury of right wrist, hand and finger(s), initial encounter: Secondary | ICD-10-CM | POA: Diagnosis present

## 2019-03-23 DIAGNOSIS — Y998 Other external cause status: Secondary | ICD-10-CM | POA: Insufficient documentation

## 2019-03-23 DIAGNOSIS — I2510 Atherosclerotic heart disease of native coronary artery without angina pectoris: Secondary | ICD-10-CM | POA: Diagnosis not present

## 2019-03-23 DIAGNOSIS — S60452A Superficial foreign body of right middle finger, initial encounter: Secondary | ICD-10-CM | POA: Insufficient documentation

## 2019-03-23 DIAGNOSIS — W268XXA Contact with other sharp object(s), not elsewhere classified, initial encounter: Secondary | ICD-10-CM | POA: Insufficient documentation

## 2019-03-23 DIAGNOSIS — Z87891 Personal history of nicotine dependence: Secondary | ICD-10-CM | POA: Diagnosis not present

## 2019-03-23 MED ORDER — LIDOCAINE HCL (PF) 1 % IJ SOLN
5.0000 mL | Freq: Once | INTRAMUSCULAR | Status: AC
  Administered 2019-03-23: 18:00:00 5 mL
  Filled 2019-03-23: qty 5

## 2019-03-23 NOTE — ED Notes (Signed)
ED Provider at bedside. 

## 2019-03-23 NOTE — ED Triage Notes (Signed)
He has a fishing lure in his right middle finger.

## 2019-03-23 NOTE — Discharge Instructions (Signed)
You were seen in the ED for foreign body embedded in your finger.  We were able to remove this without any complications.  We deferred x-rays today given that the fishhook was very superficial and we did not think it affected the bones.  Tetanus was updated today.  Keep the wound clean with warm water and soap.  Return for swelling, redness, warmth, fevers, pus from the wound.

## 2019-03-23 NOTE — ED Provider Notes (Signed)
[facility] EMERGENCY DEPARTMENT Provider Note   CSN: [id number] Arrival date & time: 03/23/19  1727    History   Chief Complaint Chief Complaint  Patient presents with  . Foreign Body    HPI Dean Robertson is a 58 y.o. male here for evaluation of fishhook embedded in his right middle finger.  This occurred around 1 PM.  Try to remove it himself but was unable to due to the pain.  Unknown last tetanus status.  No injury of the nail.  No distal tingling or loss of sensation.  No pain with movement of the affected finger.     HPI  Past Medical History:  Diagnosis Date  . Anxiety   . Arthritis    shoulders  . Coronary artery disease   . Diabetes mellitus without complication ([place])   . H/O heart artery stent    x1  . High cholesterol   . Hypertension     Patient Active Problem List   Diagnosis Date Noted  . Anxiety 02/05/2019  . Essential hypertension 12/08/2017  . Type 2 diabetes mellitus without complication, with long-term current use of insulin ([place]) 12/08/2017  . Chest pain 10/04/2017  . ASHD (arteriosclerotic heart disease) 11/15/2015  . Hyperlipidemia 11/15/2015  . Neuropathy 11/15/2015  . Sprain/strain 11/27/2013    Past Surgical History:  Procedure Laterality Date  . COLONOSCOPY     2012- normal   . CORONARY ANGIOPLASTY WITH STENT PLACEMENT          Home Medications    Prior to Admission medications   Medication Sig Start Date End Date Taking? Authorizing Provider  acetaminophen (TYLENOL) 500 MG tablet Take 1 tablet (500 mg total) by mouth every 6 (six) hours as needed. 12/08/17   Dorena Dew, FNP  aspirin EC 81 MG tablet Take 81 mg by mouth daily.    [provider]  Blood Glucose Monitoring Suppl (TRUE METRIX AIR GLUCOSE METER) w/Device KIT 1 each 3 (three) times daily by Does not apply route. 09/19/17   Dorena Dew, FNP  cyclobenzaprine (FLEXERIL) 10 MG tablet Take 1 tablet (10 mg total) by mouth 2 (two) times daily  as needed for muscle spasms. 02/05/19   Lanae Boast, FNP  gabapentin (NEURONTIN) 300 MG capsule Take 1 capsule (300 mg total) by mouth 3 (three) times daily. 02/05/19   Lanae Boast, FNP  glucose blood (TRUE METRIX BLOOD GLUCOSE TEST) test strip Use as instructed 05/19/18   Lanae Boast, FNP  Insulin Glargine (LANTUS SOLOSTAR) 100 UNIT/ML Solostar Pen Inject 50 Units into the skin at bedtime. 02/05/19   Lanae Boast, FNP  Lancets MISC 1 each 3 (three) times daily by Does not apply route. 09/19/17   Dorena Dew, FNP  lisinopril (PRINIVIL,ZESTRIL) 20 MG tablet Take 1 tablet (20 mg total) by mouth daily. 02/05/19   Lanae Boast, FNP  Menthol, Topical Analgesic, (BIOFREEZE) 4 % GEL Apply 1 application topically 4 (four) times daily as needed. 10/09/18   Lanae Boast, FNP  metFORMIN (GLUCOPHAGE) 1000 MG tablet Take 1 tablet (1,000 mg total) by mouth 2 (two) times daily with a meal. 02/05/19   Lanae Boast, FNP  Multiple Vitamins-Minerals (VITRUM SENIOR) TABS Take by mouth.    [provider]  propranolol (INDERAL) 10 MG tablet Take 4 tablets (40 mg total) by mouth every 12 (twelve) hours as needed (anxiety). 02/05/19   Lanae Boast, FNP  simvastatin (ZOCOR) 20 MG tablet Take 1 tablet (20 mg total) by mouth daily  at 6 PM. 02/05/19   Lanae Boast, FNP    Family History Family History  Problem Relation Age of Onset  . Hypertension Mother   . Diabetes Father   . Colon polyps Neg Hx   . Colon cancer Neg Hx   . Esophageal cancer Neg Hx   . Rectal cancer Neg Hx   . Stomach cancer Neg Hx     Social History Social History   Tobacco Use  . Smoking status: Former Research scientist (life sciences)  . Smokeless tobacco: Never Used  . Tobacco comment: as teenager  Substance Use Topics  . Alcohol use: Yes    Comment: occ glass of wine   . Drug use: No     Allergies   Patient has no known allergies.   Review of Systems Review of Systems  Skin: Positive for wound.  All other systems reviewed and are  negative.    Physical Exam Updated Vital Signs BP 136/88   Pulse 70   Temp 97.8 F (36.6 C) (Oral)   Resp 14   Ht 5' 8" (1.727 m)   Wt 78.9 kg   SpO2 100%   BMI 26.46 kg/m   Physical Exam Constitutional:      Appearance: He is well-developed. He is not toxic-appearing.  HENT:     Head: Normocephalic.     Right Ear: External ear normal.     Left Ear: External ear normal.     Nose: Nose normal.  Eyes:     Conjunctiva/sclera: Conjunctivae normal.  Neck:     Musculoskeletal: Full passive range of motion without pain.  Cardiovascular:     Rate and Rhythm: Normal rate.     Comments: Right middle fingertip is warm, brisk cap refill. Pulmonary:     Effort: Pulmonary effort is normal. No tachypnea or respiratory distress.  Musculoskeletal: Normal range of motion.  Skin:    General: Skin is warm and dry.     Capillary Refill: Capillary refill takes less than 2 seconds.     Comments: Small metal fishhook embedded in the lateral finger pad of the right middle finger.  Nail, lateral nail fold and cuticle all intact.  No significant tenderness to this area.  Patient has full range of motion of this digit without pain.  No obvious bone deformity.  Neurological:     Mental Status: He is alert and oriented to person, place, and time.     Comments: Sensation to light touch intact in the right middle fingertip.  Strength is intact in the right middle   Psychiatric:        Behavior: Behavior normal.        Thought Content: Thought content normal.      ED Treatments / Results  Labs (all labs ordered are listed, but only abnormal results are displayed) Labs Reviewed - No data to display  EKG None  Radiology No results found.  Procedures .Foreign Body Removal Date/Time: 03/23/2019 6:58 PM Performed by: Kinnie Feil, PA-C Authorized by: Kinnie Feil, PA-C  Consent: Verbal consent obtained. Written consent not obtained. Risks and benefits: risks, benefits and  alternatives were discussed Consent given by: patient Patient understanding: patient states understanding of the procedure being performed Patient consent: the patient's understanding of the procedure matches consent given Procedure consent: procedure consent matches procedure scheduled Relevant documents: relevant documents present and verified Site marked: the operative site was marked Required items: required blood products, implants, devices, and special equipment available Patient identity confirmed: verbally with  patient and arm band Time out: Immediately prior to procedure a "time out" was called to verify the correct patient, procedure, equipment, support staff and site/side marked as required. Body area: skin General location: upper extremity Location details: right long finger Anesthesia: local infiltration  Anesthesia: Local Anesthetic: lidocaine 1% without epinephrine Anesthetic total: 3 mL  Sedation: Patient sedated: no  Patient restrained: no Patient cooperative: yes Localization method: visualized Removal mechanism: forceps Dressing: dressing applied Tendon involvement: none Depth: subcutaneous Complexity: simple 1 objects recovered. Objects recovered: fish hook  Post-procedure assessment: foreign body removed Patient tolerance: Patient tolerated the procedure well with no immediate complications   (including critical care time)  Medications Ordered in ED Medications  lidocaine (PF) (XYLOCAINE) 1 % injection 5 mL (5 mLs Infiltration Given 03/23/19 1820)     Initial Impression / Assessment and Plan / ED Course  I have reviewed the triage vital signs and the nursing notes.  Pertinent labs & imaging results that were available during my care of the patient were reviewed by me and considered in my medical decision making (see chart for details).        Patient hook removed from subcutaneous skin of the right middle finger.  Patient tolerated procedure  well.  No immediate complications.  Prior to procedure I discussed with patient that typically x-rays prior/post removal are recommended for foreign body removal however given that this object is small and just barely in the subcutaneous tissue I have low suspicion that there was significant trauma to the bone/soft tissues.  He was comfortable deferring x-rays today pre-/post removal.  I think this is reasonable.  Tetanus updated.  Full range of motion of the finger and sensation post removal.  Will DC with wound care instructions.  Return precautions were discussed.  Patient is comfortable with this.  Final Clinical Impressions(s) / ED Diagnoses   Final diagnoses:  Fish hook injury of middle finger, right, initial encounter  Foreign body in skin of finger, initial encounter    ED Discharge Orders    None       Kinnie Feil, PA-C 03/23/19 1901    Lennice Sites, DO 03/23/19 1927

## 2019-04-29 ENCOUNTER — Telehealth: Payer: Self-pay

## 2019-04-29 NOTE — Telephone Encounter (Signed)
Called to do COVID Screening for appointment tomorrow. No answer. Left a message to call back. Thanks! 

## 2019-04-30 ENCOUNTER — Encounter: Payer: Self-pay | Admitting: Family Medicine

## 2019-04-30 ENCOUNTER — Ambulatory Visit (HOSPITAL_COMMUNITY)
Admission: RE | Admit: 2019-04-30 | Discharge: 2019-04-30 | Disposition: A | Discharge status: Home / Self Care | Payer: Commercial Managed Care - PPO | Source: Ambulatory Visit | Attending: Family Medicine | Admitting: Family Medicine

## 2019-04-30 ENCOUNTER — Other Ambulatory Visit: Payer: Self-pay

## 2019-04-30 ENCOUNTER — Ambulatory Visit: Payer: Self-pay | Admitting: Family Medicine

## 2019-04-30 ENCOUNTER — Ambulatory Visit (INDEPENDENT_AMBULATORY_CARE_PROVIDER_SITE_OTHER): Payer: Commercial Managed Care - PPO | Admitting: Family Medicine

## 2019-04-30 VITALS — BP 127/76 | HR 68 | Temp 97.6°F | Resp 14 | Ht 69.0 in | Wt 166.0 lb

## 2019-04-30 DIAGNOSIS — H5789 Other specified disorders of eye and adnexa: Secondary | ICD-10-CM | POA: Diagnosis not present

## 2019-04-30 DIAGNOSIS — M25511 Pain in right shoulder: Secondary | ICD-10-CM

## 2019-04-30 DIAGNOSIS — M545 Low back pain, unspecified: Secondary | ICD-10-CM

## 2019-04-30 IMAGING — DX LUMBAR SPINE - COMPLETE 4+ VIEW
5 series · 5 of 5 positions shown · non-contrast
Comparison: Plain films lumbar spine [DATE].

CLINICAL DATA: Chronic low back pain.  No known injury.

EXAM:
LUMBAR SPINE - COMPLETE 4+ VIEW

[l-spine ap]
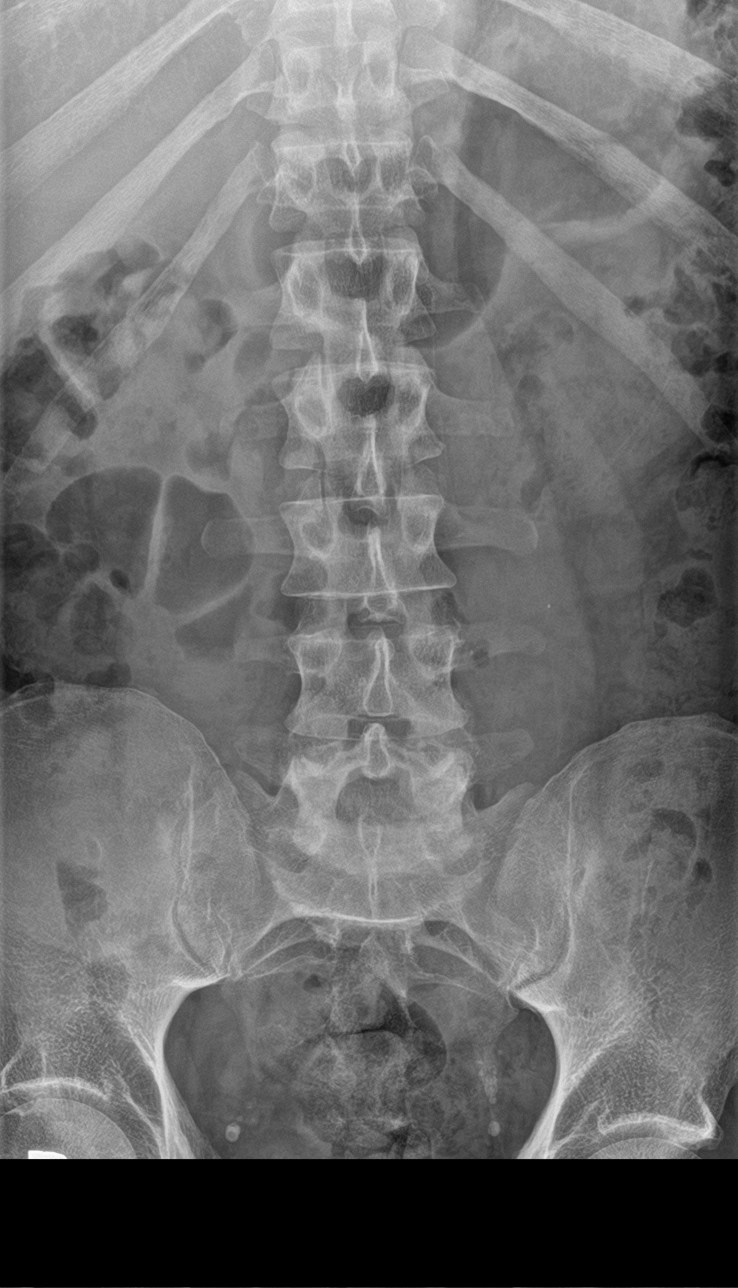

[l-spine obl (1 of 2)]
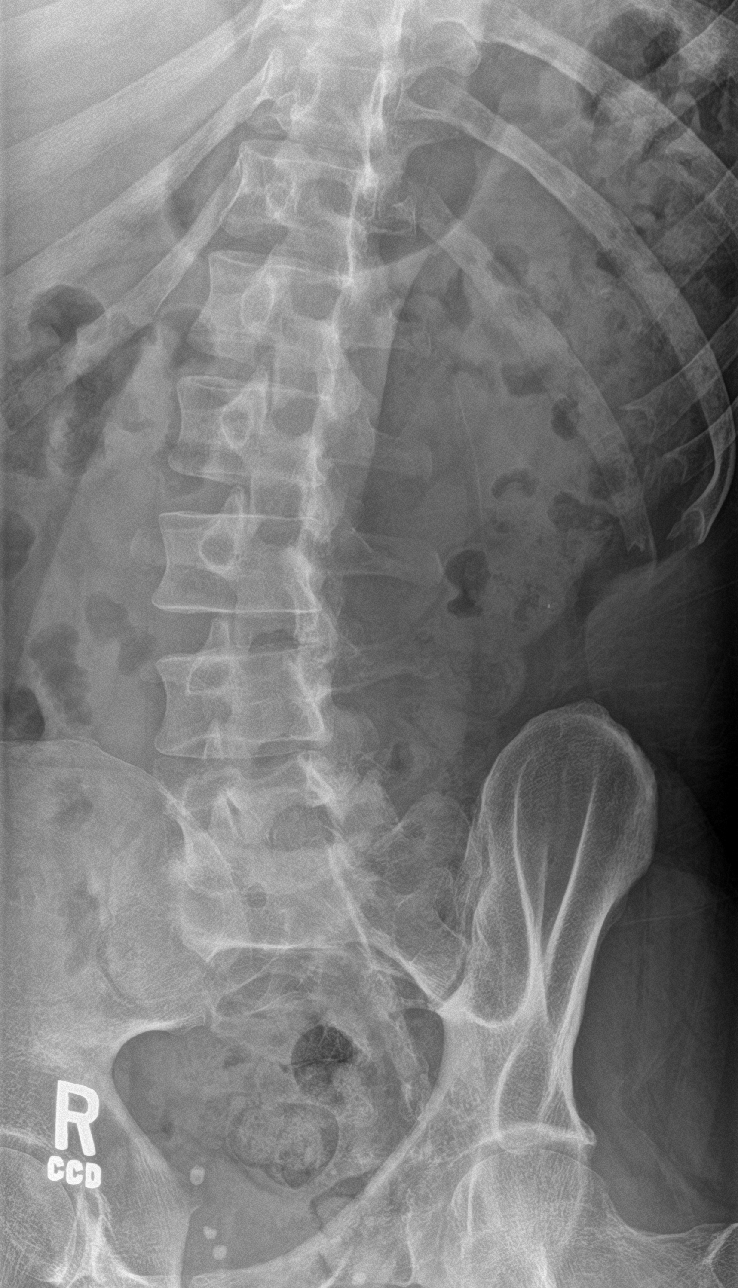

[l-spine obl (2 of 2)]
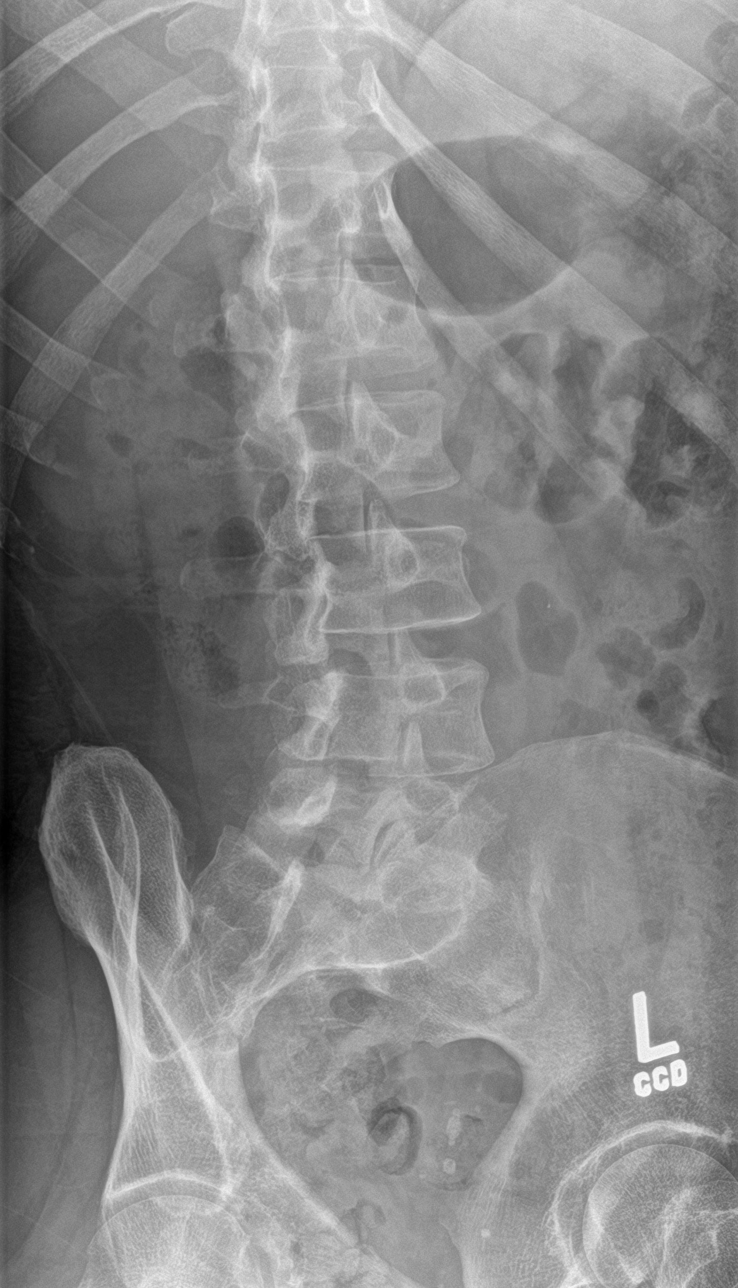

[l-spine lat]
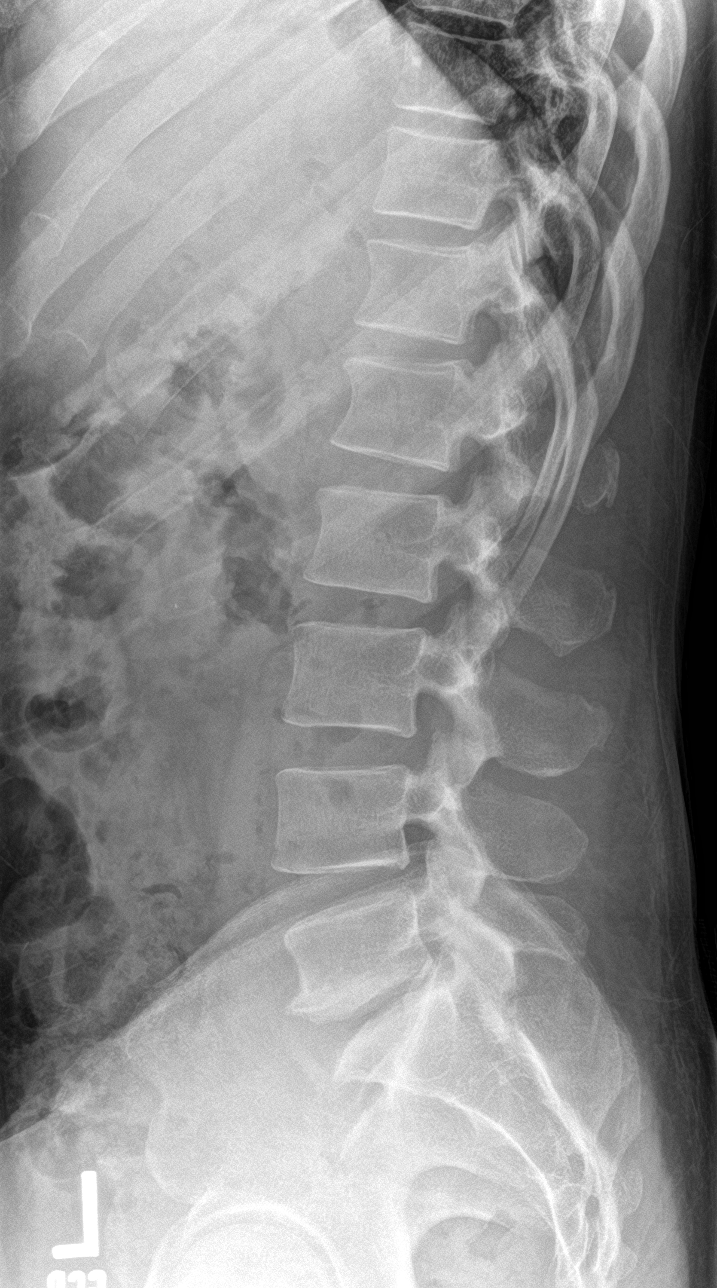

[l-spine spot]
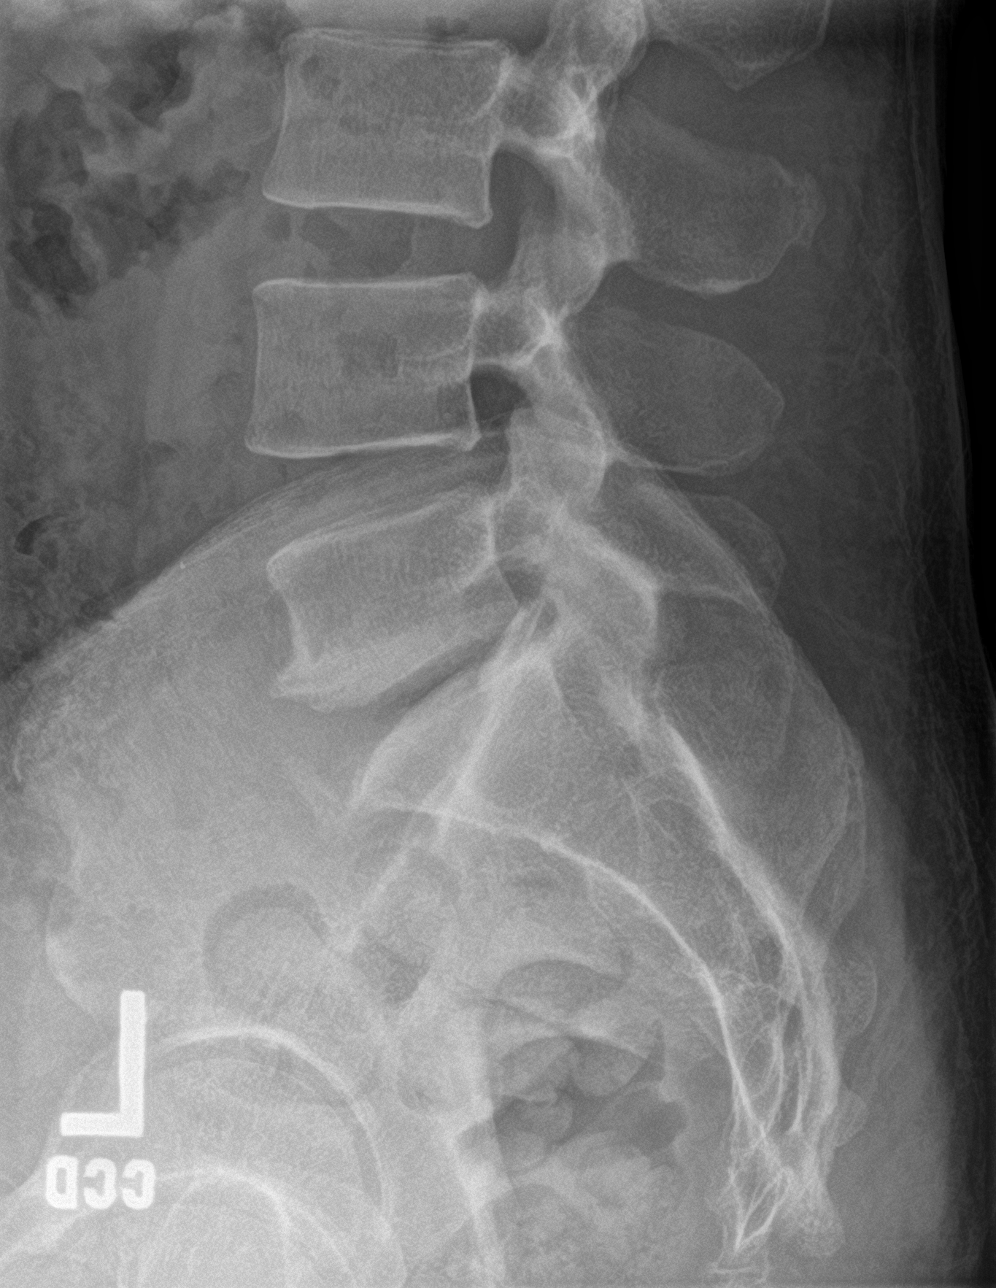

[5 of 5 positions shown; findings below may reference images not displayed]

FINDINGS: No fracture or malalignment. No focal lesion. Loss of disc space
height and endplate spurring at L5-S1 appear unchanged. No pars
interarticularis defect. Paraspinous structures unremarkable.
IMPRESSION: No acute abnormality. No change in degenerative disc disease at
L5-S1.

## 2019-04-30 IMAGING — DX RIGHT SHOULDER - 2+ VIEW
3 series · 3 of 3 positions shown · non-contrast
Comparison: None.

CLINICAL DATA: Chronic diffuse right shoulder pain, pain across the
low back, no known injury

EXAM:
RIGHT SHOULDER - 2+ VIEW

[shoulder grashey]
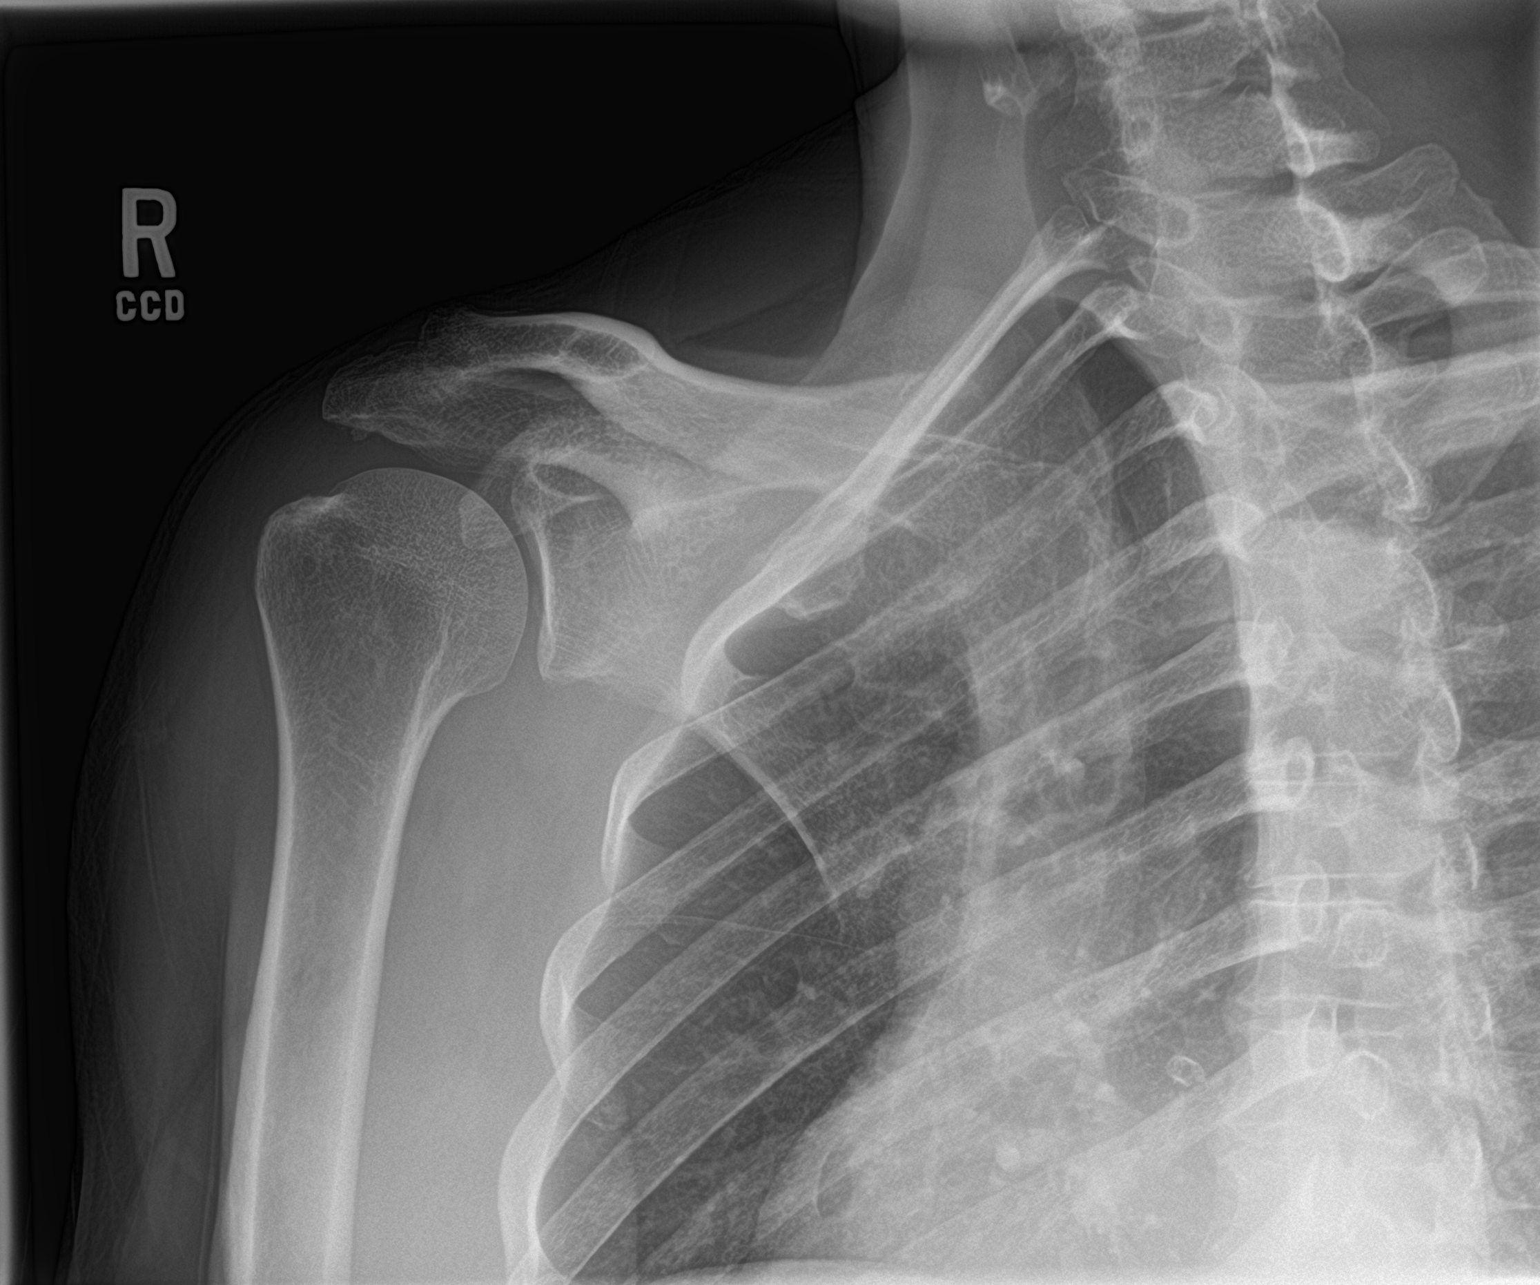

[shoulder y view]
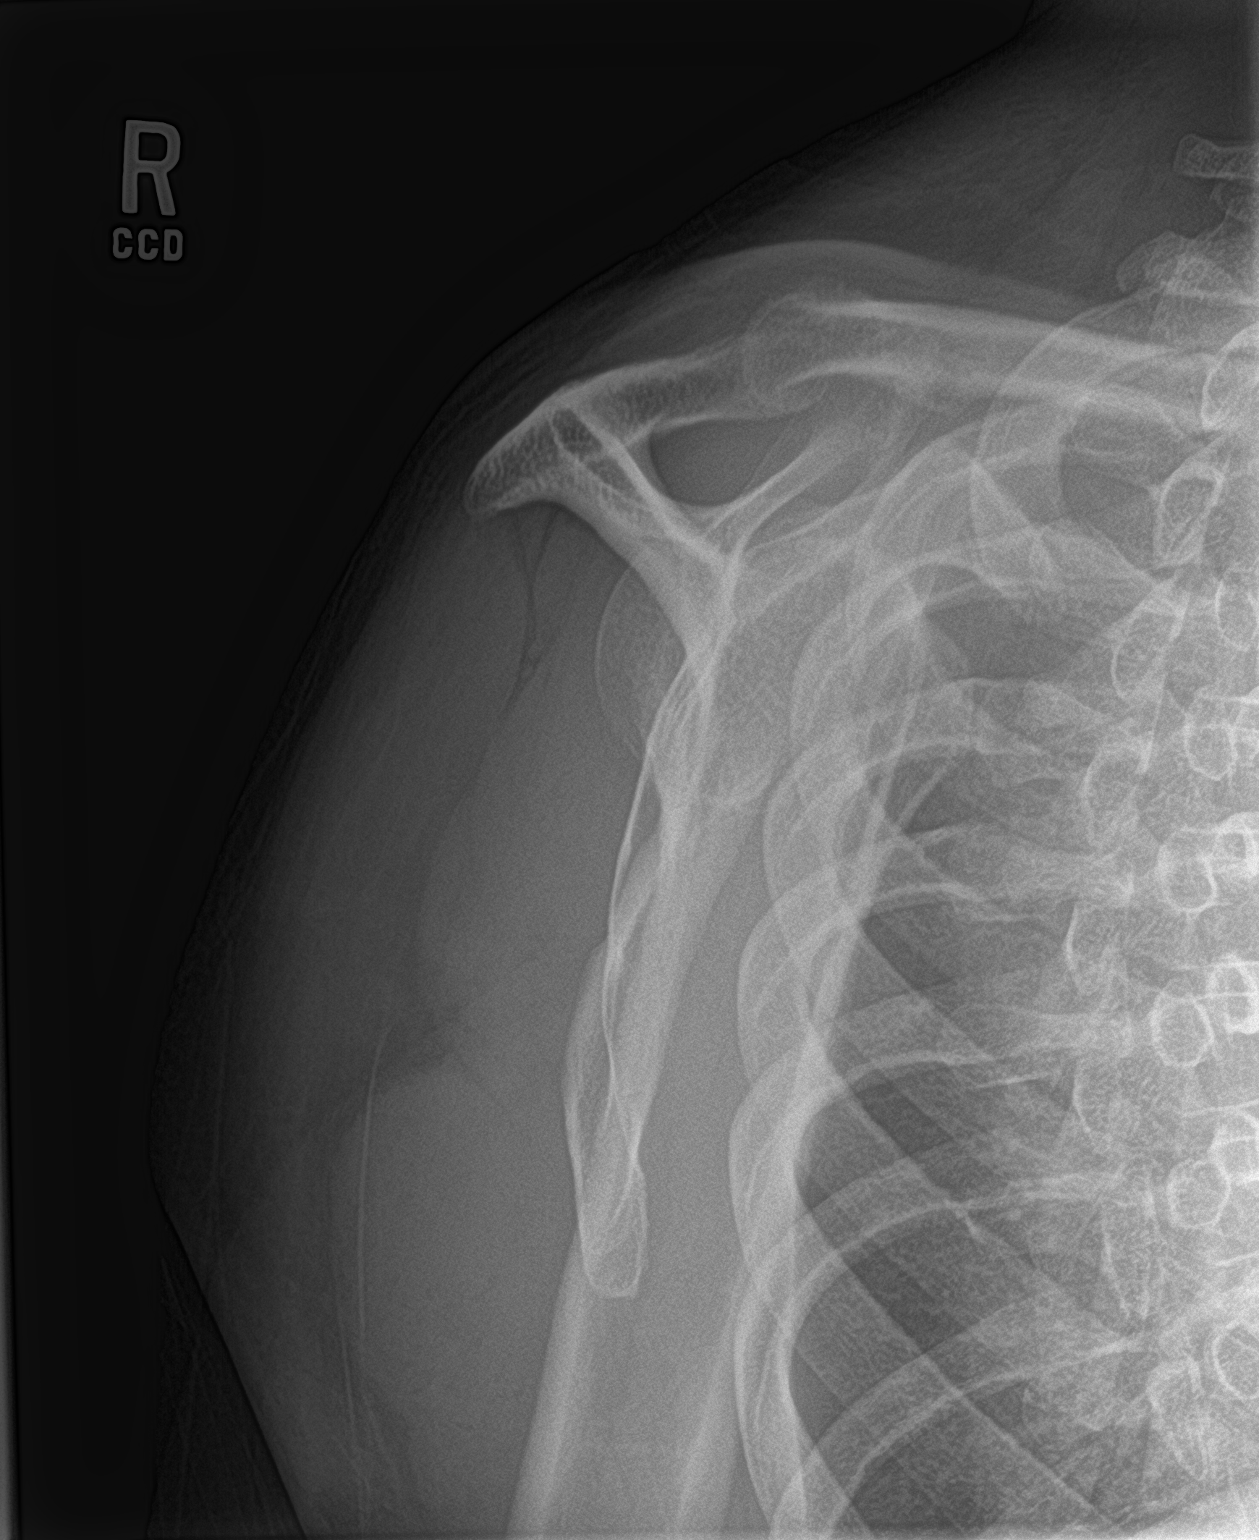

[shoulder axillary]
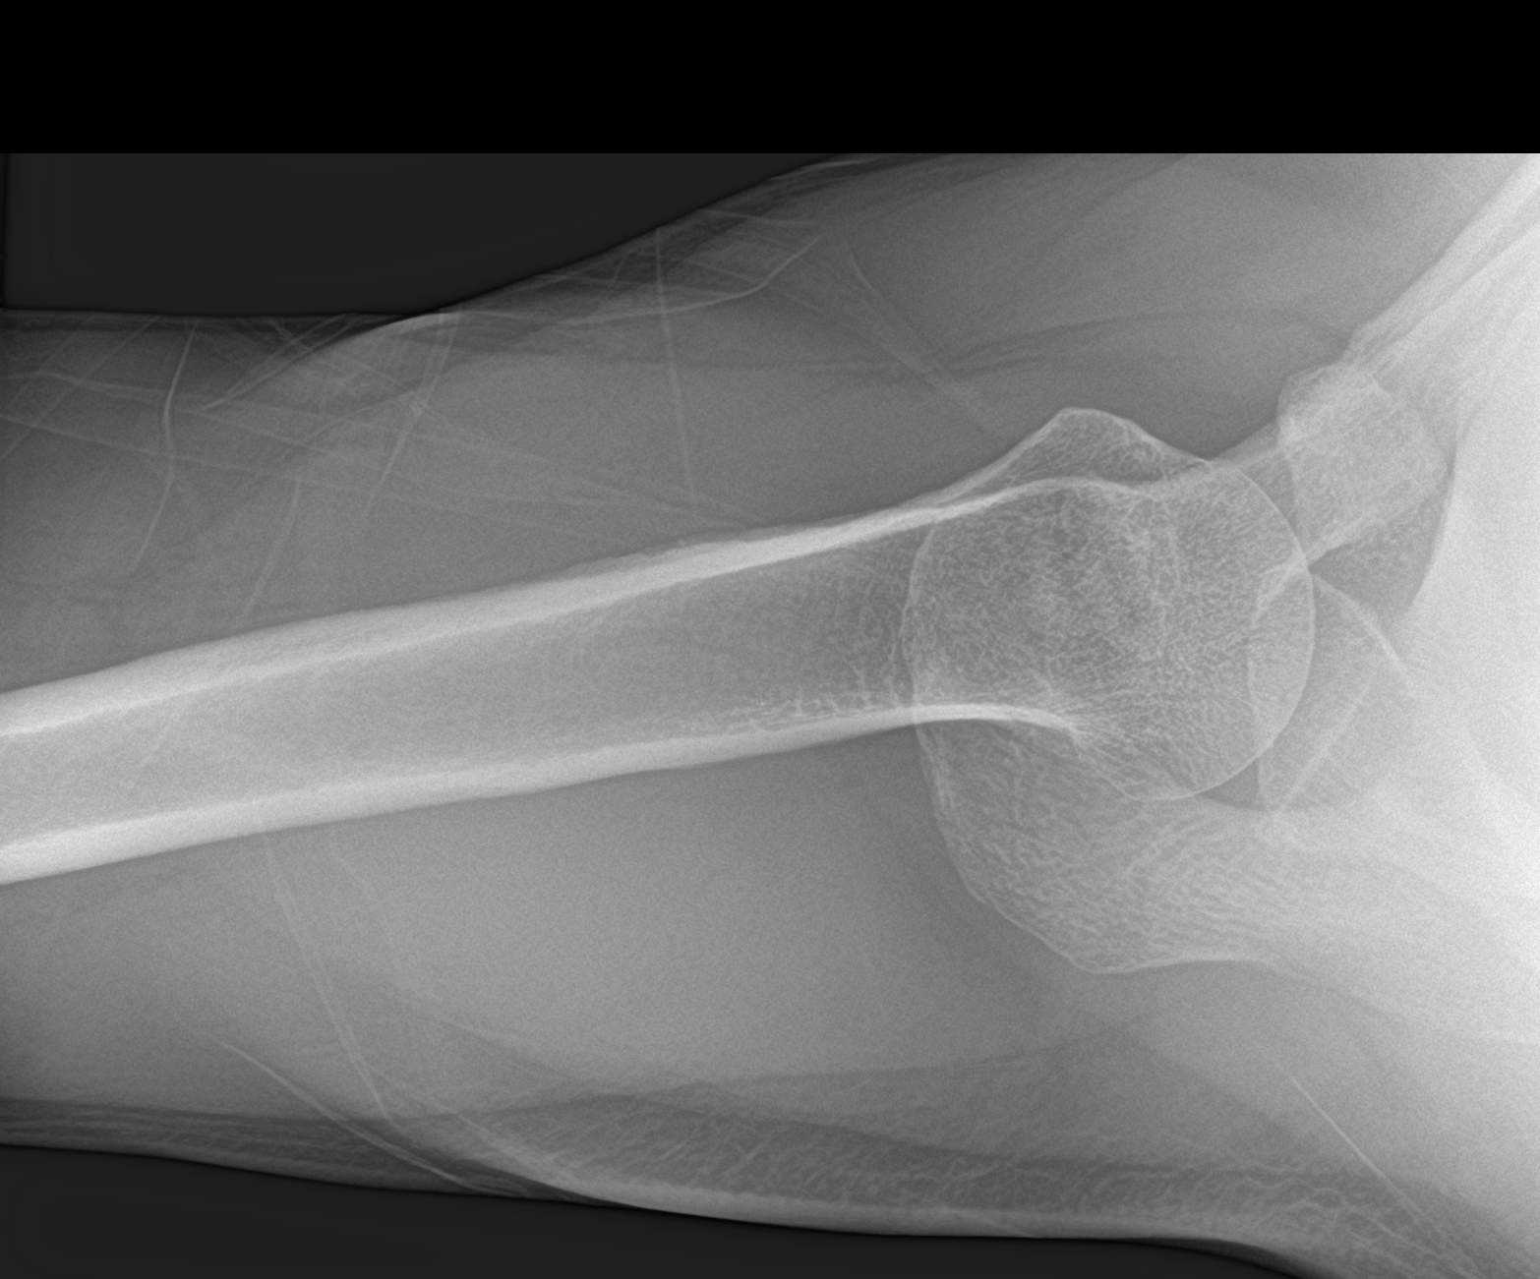

[3 of 3 positions shown; findings below may reference images not displayed]

FINDINGS: There is no evidence of fracture or dislocation. There is no
evidence of arthropathy or other focal bone abnormality. Soft
tissues are unremarkable.
IMPRESSION: Negative.

## 2019-04-30 MED ORDER — TRAMADOL HCL 50 MG PO TABS: 50.0000 mg | ORAL_TABLET | Freq: Three times a day (TID) | ORAL | 0 refills | Status: DC | PRN

## 2019-04-30 MED ORDER — KETOROLAC TROMETHAMINE 60 MG/2ML IM SOLN
60.0000 mg | Freq: Once | INTRAMUSCULAR | Status: AC
  Administered 2019-04-30: 60 mg via INTRAMUSCULAR

## 2019-04-30 MED ORDER — TOBRAMYCIN 0.3 % OP SOLN: 2.0000 [drp] | OPHTHALMIC | 0 refills | Status: AC

## 2019-04-30 NOTE — Progress Notes (Signed)
Patient [facility]   Progress Note: Sick Visit Provider: Lanae Boast, FNP  SUBJECTIVE:   Dean Robertson is a 58 y.o. male who  has a past medical history of Anxiety, Arthritis, Coronary artery disease, Diabetes mellitus without complication ([place]), H/O heart artery stent, High cholesterol, and Hypertension.. Patient presents today for Shoulder Pain (right shoulder ), Back Pain, and Eye Problem (right eye irritation )  Patient with a hx of corneal abrasion of the right eye. He states that yesterday, he went outside and feels that dust has gotten in the eye. He describes the pain as "a pin being stuck in the eye". Clear exudate reported.  He states right shoulder pain 10/10 x 3 months. He states that he has taken 800mg  ibuprofen with slight relief. He has a history of cortisone injections x2 in the past 10 years  Back pain- states that he is having 9/10 back pain most days x 3 months.  Review of Systems  Constitutional: Negative.   HENT: Negative.   Eyes: Positive for pain (irritation).  Respiratory: Negative.   Cardiovascular: Negative.   Gastrointestinal: Negative.   Genitourinary: Negative.   Musculoskeletal: Positive for back pain and joint pain (right shoulder).  Skin: Negative.   Neurological: Negative.   Psychiatric/Behavioral: Negative.      OBJECTIVE: BP 127/76 (BP Location: Left Arm, Patient Position: Sitting, Cuff Size: Normal)   Pulse 68   Temp 97.6 F (36.4 C) (Oral)   Resp 14   Ht 5\' 9"  (1.753 m)   Wt 166 lb (75.3 kg)   SpO2 100%   BMI 24.51 kg/m   Wt Readings from Last 3 Encounters:  04/30/19 166 lb (75.3 kg)  03/23/19 174 lb (78.9 kg)  01/09/19 175 lb (79.4 kg)     Physical Exam Vitals signs and nursing note reviewed.  Constitutional:      General: He is not in acute distress.    Appearance: Normal appearance.  HENT:     Head: Normocephalic and atraumatic.  Eyes:     Extraocular Movements: Extraocular  movements intact.     Conjunctiva/sclera: Conjunctivae normal.     Pupils: Pupils are equal, round, and reactive to light.  Cardiovascular:     Rate and Rhythm: Normal rate and regular rhythm.     Heart sounds: No murmur.  Pulmonary:     Effort: Pulmonary effort is normal.     Breath sounds: Normal breath sounds.  Musculoskeletal:     Right shoulder: He exhibits decreased range of motion and tenderness. He exhibits normal strength.     Lumbar back: He exhibits decreased range of motion and tenderness.  Skin:    General: Skin is warm and dry.  Neurological:     Mental Status: He is alert and oriented to person, place, and time.  Psychiatric:        Mood and Affect: Mood normal.        Behavior: Behavior normal.        Thought Content: Thought content normal.        Judgment: Judgment normal.     ASSESSMENT/PLAN:   1. Right shoulder pain, unspecified chronicity - DG Shoulder Right; Future - traMADol (ULTRAM) 50 MG tablet; Take 1 tablet (50 mg total) by mouth every 8 (eight) hours as needed for up to 5 days.  Dispense: 15 tablet; Refill: 0 - ketorolac (TORADOL) injection 60 mg  2. Lumbar pain - DG Lumbar Spine Complete; Future - traMADol (ULTRAM) 50  MG tablet; Take 1 tablet (50 mg total) by mouth every 8 (eight) hours as needed for up to 5 days.  Dispense: 15 tablet; Refill: 0  3. Eye irritation No signs of foreign body at the present time. If persisting, go to opthalmology  for further in depth evaluation.  - tobramycin (TOBREX) 0.3 % ophthalmic solution; Place 2 drops into the right eye every 4 (four) hours for 7 days.  Dispense: 4.2 mL; Refill: 0       The patient was given clear instructions to go to ER or return to medical center if symptoms do not improve, worsen or new problems develop. The patient verbalized understanding and agreed with plan of care.   Ms. Doug Sou. Nathaneil Canary, FNP-BC Patient [facility] [address] [place],   [address] [phone]     This note has been created with Dragon speech recognition software and smart phrase technology. Any transcriptional errors are unintentional.

## 2019-04-30 NOTE — Patient Instructions (Addendum)
Arthritis Arthritis means joint pain. It can also mean joint disease. A joint is a place where bones come together. People who have arthritis may have:  Red joints.  Swollen joints.  Stiff joints.  Warm joints.  A fever.  A feeling of being sick. Follow these instructions at home: Pay attention to any changes in your symptoms. Take these actions to help with your pain and swelling. Medicines  Take over-the-counter and prescription medicines only as told by your doctor.  Do not take aspirin for pain if your doctor says that you may have gout. Activity  Rest your joint if your doctor tells you to.  Avoid activities that make the pain worse.  Exercise your joint regularly as told by your doctor. Try doing exercises like: ? Swimming. ? Water aerobics. ? Biking. ? Walking. Joint Care   If your joint is swollen, keep it raised (elevated) if told by your doctor.  If your joint feels stiff in the morning, try taking a warm shower.  If you have diabetes, do not apply heat without asking your doctor.  If told, apply heat to the joint: ? Put a towel between the joint and the hot pack or heating pad. ? Leave the heat on the area for 20-30 minutes.  If told, apply ice to the joint: ? Put ice in a plastic bag. ? Place a towel between your skin and the bag. ? Leave the ice on for 20 minutes, 2-3 times per day.  Keep all follow-up visits as told by your doctor. Contact a doctor if:  The pain gets worse.  You have a fever. Get help right away if:  You have very bad pain in your joint.  You have swelling in your joint.  Your joint is red.  Many joints become painful and swollen.  You have very bad back pain.  Your leg is very weak.  You cannot control your pee (urine) or poop (stool). This information is not intended to replace advice given to you by your health care provider. Make sure you discuss any questions you have with your health care provider. Document  Released: 01/16/2010 Document Revised: 03/29/2016 Document Reviewed: 01/17/2015 Elsevier Interactive Patient Education  2019 [place] Maintenance, Male A healthy lifestyle and preventive care is important for your health and wellness. Ask your health care provider about what schedule of regular examinations is right for you. What should I know about weight and diet? Eat a Healthy Diet  Eat plenty of vegetables, fruits, whole grains, low-fat dairy products, and lean protein.  Do not eat a lot of foods high in solid fats, added sugars, or salt.  Maintain a Healthy Weight Regular exercise can help you achieve or maintain a healthy weight. You should:  Do at least 150 minutes of exercise each week. The exercise should increase your heart rate and make you sweat (moderate-intensity exercise).  Do strength-training exercises at least twice a week. Watch Your Levels of Cholesterol and Blood Lipids  Have your blood tested for lipids and cholesterol every 5 years starting at 59 years of age. If you are at high risk for heart disease, you should start having your blood tested when you are 59 years old. You may need to have your cholesterol levels checked more often if: ? Your lipid or cholesterol levels are high. ? You are older than 59 years of age. ? You are at high risk for heart disease. What should I know about cancer screening? Many  types of cancers can be detected early and may often be prevented. Lung Cancer  You should be screened every year for lung cancer if: ? You are a current smoker who has smoked for at least 30 years. ? You are a former smoker who has quit within the past 15 years.  Talk to your health care provider about your screening options, when you should start screening, and how often you should be screened. Colorectal Cancer  Routine colorectal cancer screening usually begins at 59 years of age and should be repeated every 5-10 years until you are 59  years old. You may need to be screened more often if early forms of precancerous polyps or small growths are found. Your health care provider may recommend screening at an earlier age if you have risk factors for colon cancer.  Your health care provider may recommend using home test kits to check for hidden blood in the stool.  A small camera at the end of a tube can be used to examine your colon (sigmoidoscopy or colonoscopy). This checks for the earliest forms of colorectal cancer. Prostate and Testicular Cancer  Depending on your age and overall health, your health care provider may do certain tests to screen for prostate and testicular cancer.  Talk to your health care provider about any symptoms or concerns you have about testicular or prostate cancer. Skin Cancer  Check your skin from head to toe regularly.  Tell your health care provider about any new moles or changes in moles, especially if: ? There is a change in a mole's size, shape, or color. ? You have a mole that is larger than a pencil eraser.  Always use sunscreen. Apply sunscreen liberally and repeat throughout the day.  Protect yourself by wearing long sleeves, pants, a wide-brimmed hat, and sunglasses when outside. What should I know about heart disease, diabetes, and high blood pressure?  If you are 39-59 years of age, have your blood pressure checked every 3-5 years. If you are 17 years of age or older, have your blood pressure checked every year. You should have your blood pressure measured twice-once when you are at a hospital or clinic, and once when you are not at a hospital or clinic. Record the average of the two measurements. To check your blood pressure when you are not at a hospital or clinic, you can use: ? An automated blood pressure machine at a pharmacy. ? A home blood pressure monitor.  Talk to your health care provider about your target blood pressure.  If you are between 29-28 years old, ask your  health care provider if you should take aspirin to prevent heart disease.  Have regular diabetes screenings by checking your fasting blood sugar level. ? If you are at a normal weight and have a low risk for diabetes, have this test once every three years after the age of 69. ? If you are overweight and have a high risk for diabetes, consider being tested at a younger age or more often.  A one-time screening for abdominal aortic aneurysm (AAA) by ultrasound is recommended for men aged 46-75 years who are current or former smokers. What should I know about preventing infection? Hepatitis B If you have a higher risk for hepatitis B, you should be screened for this virus. Talk with your health care provider to find out if you are at risk for hepatitis B infection. Hepatitis C Blood testing is recommended for:  Everyone born from [birth year] through  [place] with known risk factors for hepatitis C. Sexually Transmitted Diseases (STDs)  You should be screened each year for STDs including gonorrhea and chlamydia if: ? You are sexually active and are younger than 59 years of age. ? You are older than 59 years of age and your health care provider tells you that you are at risk for this type of infection. ? Your sexual activity has changed since you were last screened and you are at an increased risk for chlamydia or gonorrhea. Ask your health care provider if you are at risk.  Talk with your health care provider about whether you are at high risk of being infected with HIV. Your health care provider may recommend a prescription medicine to help prevent HIV infection. What else can I do?  Schedule regular health, dental, and eye exams.  Stay current with your vaccines (immunizations).  Do not use any tobacco products, such as cigarettes, chewing tobacco, and e-cigarettes. If you need help quitting, ask your health care provider.  Limit alcohol intake to no more than 2 drinks per day. One drink  equals 12 ounces of beer, 5 ounces of wine, or 1 ounces of hard liquor.  Do not use street drugs.  Do not share needles.  Ask your health care provider for help if you need support or information about quitting drugs.  Tell your health care provider if you often feel depressed.  Tell your health care provider if you have ever been abused or do not feel safe at home. This information is not intended to replace advice given to you by your health care provider. Make sure you discuss any questions you have with your health care provider. Document Released: 04/19/2008 Document Revised: 06/20/2016 Document Reviewed: 07/26/2015 Elsevier Interactive Patient Education  2019 [employer].

## 2019-05-01 ENCOUNTER — Telehealth: Payer: Self-pay

## 2019-05-01 NOTE — Progress Notes (Signed)
Lumbar spine xray shows arthritis. No abnormality noted in the right shoulder. Continue with current medications.

## 2019-05-01 NOTE — Telephone Encounter (Signed)
Called, spoke with patient, advised that lumbar spine shows arthritis. Asked that she continue with current medications. Patient verbalized understanding. Thanks !

## 2019-05-01 NOTE — Telephone Encounter (Signed)
-----   Message from Lanae Boast, [place] sent at 05/01/2019 12:54 PM EDT ----- Lumbar spine xray shows arthritis. No abnormality noted in the right shoulder. Continue with current medications.

## 2019-06-01 ENCOUNTER — Ambulatory Visit: Payer: Commercial Managed Care - PPO | Admitting: Family Medicine

## 2019-06-22 ENCOUNTER — Ambulatory Visit (HOSPITAL_COMMUNITY)
Admission: RE | Admit: 2019-06-22 | Discharge: 2019-06-22 | Disposition: A | Discharge status: Home / Self Care | Payer: Commercial Managed Care - PPO | Source: Ambulatory Visit | Attending: Family Medicine | Admitting: Family Medicine

## 2019-06-22 ENCOUNTER — Other Ambulatory Visit: Payer: Self-pay

## 2019-06-22 ENCOUNTER — Ambulatory Visit (INDEPENDENT_AMBULATORY_CARE_PROVIDER_SITE_OTHER): Payer: Commercial Managed Care - PPO | Admitting: Family Medicine

## 2019-06-22 ENCOUNTER — Encounter: Payer: Self-pay | Admitting: Family Medicine

## 2019-06-22 VITALS — BP 143/80 | HR 69 | Temp 97.7°F | Resp 18 | Wt 163.8 lb

## 2019-06-22 DIAGNOSIS — R109 Unspecified abdominal pain: Secondary | ICD-10-CM | POA: Diagnosis not present

## 2019-06-22 LAB — POCT URINALYSIS DIPSTICK
Bilirubin, UA: NEGATIVE
Blood, UA: NEGATIVE
Glucose, UA: POSITIVE — AB
Ketones, UA: NEGATIVE
Leukocytes, UA: NEGATIVE
Nitrite, UA: NEGATIVE
Protein, UA: NEGATIVE
Spec Grav, UA: 1.02 (ref 1.010–1.025)
Urobilinogen, UA: 0.2 E.U./dL
pH, UA: 5.5 (ref 5.0–8.0)

## 2019-06-22 IMAGING — DX ABDOMEN - 1 VIEW
2 series · 2 of 2 positions shown · non-contrast
Comparison: None

CLINICAL DATA: Flank pain, right upper quadrant pain for 3 weeks,
history of heart stent [FO]

EXAM:
ABDOMEN - 1 VIEW

[abdomen kub (1 of 2)]
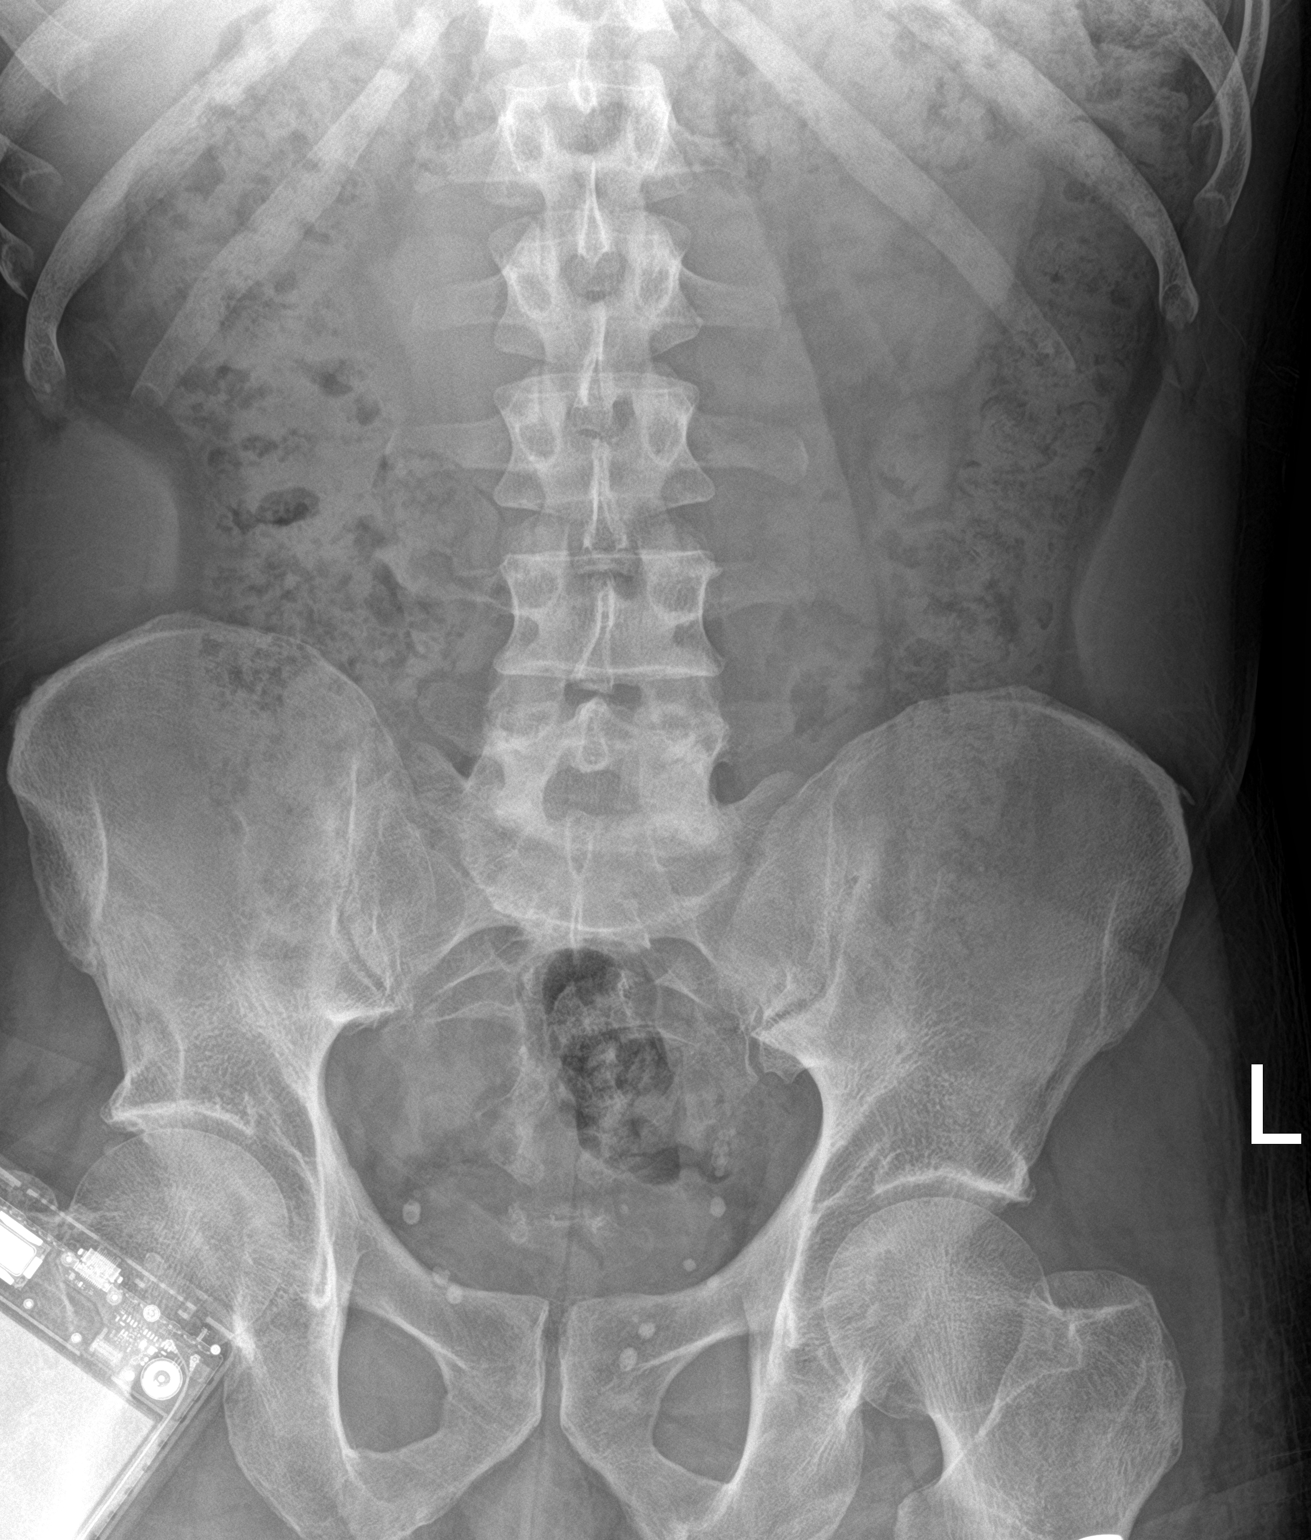

[abdomen kub (2 of 2)]
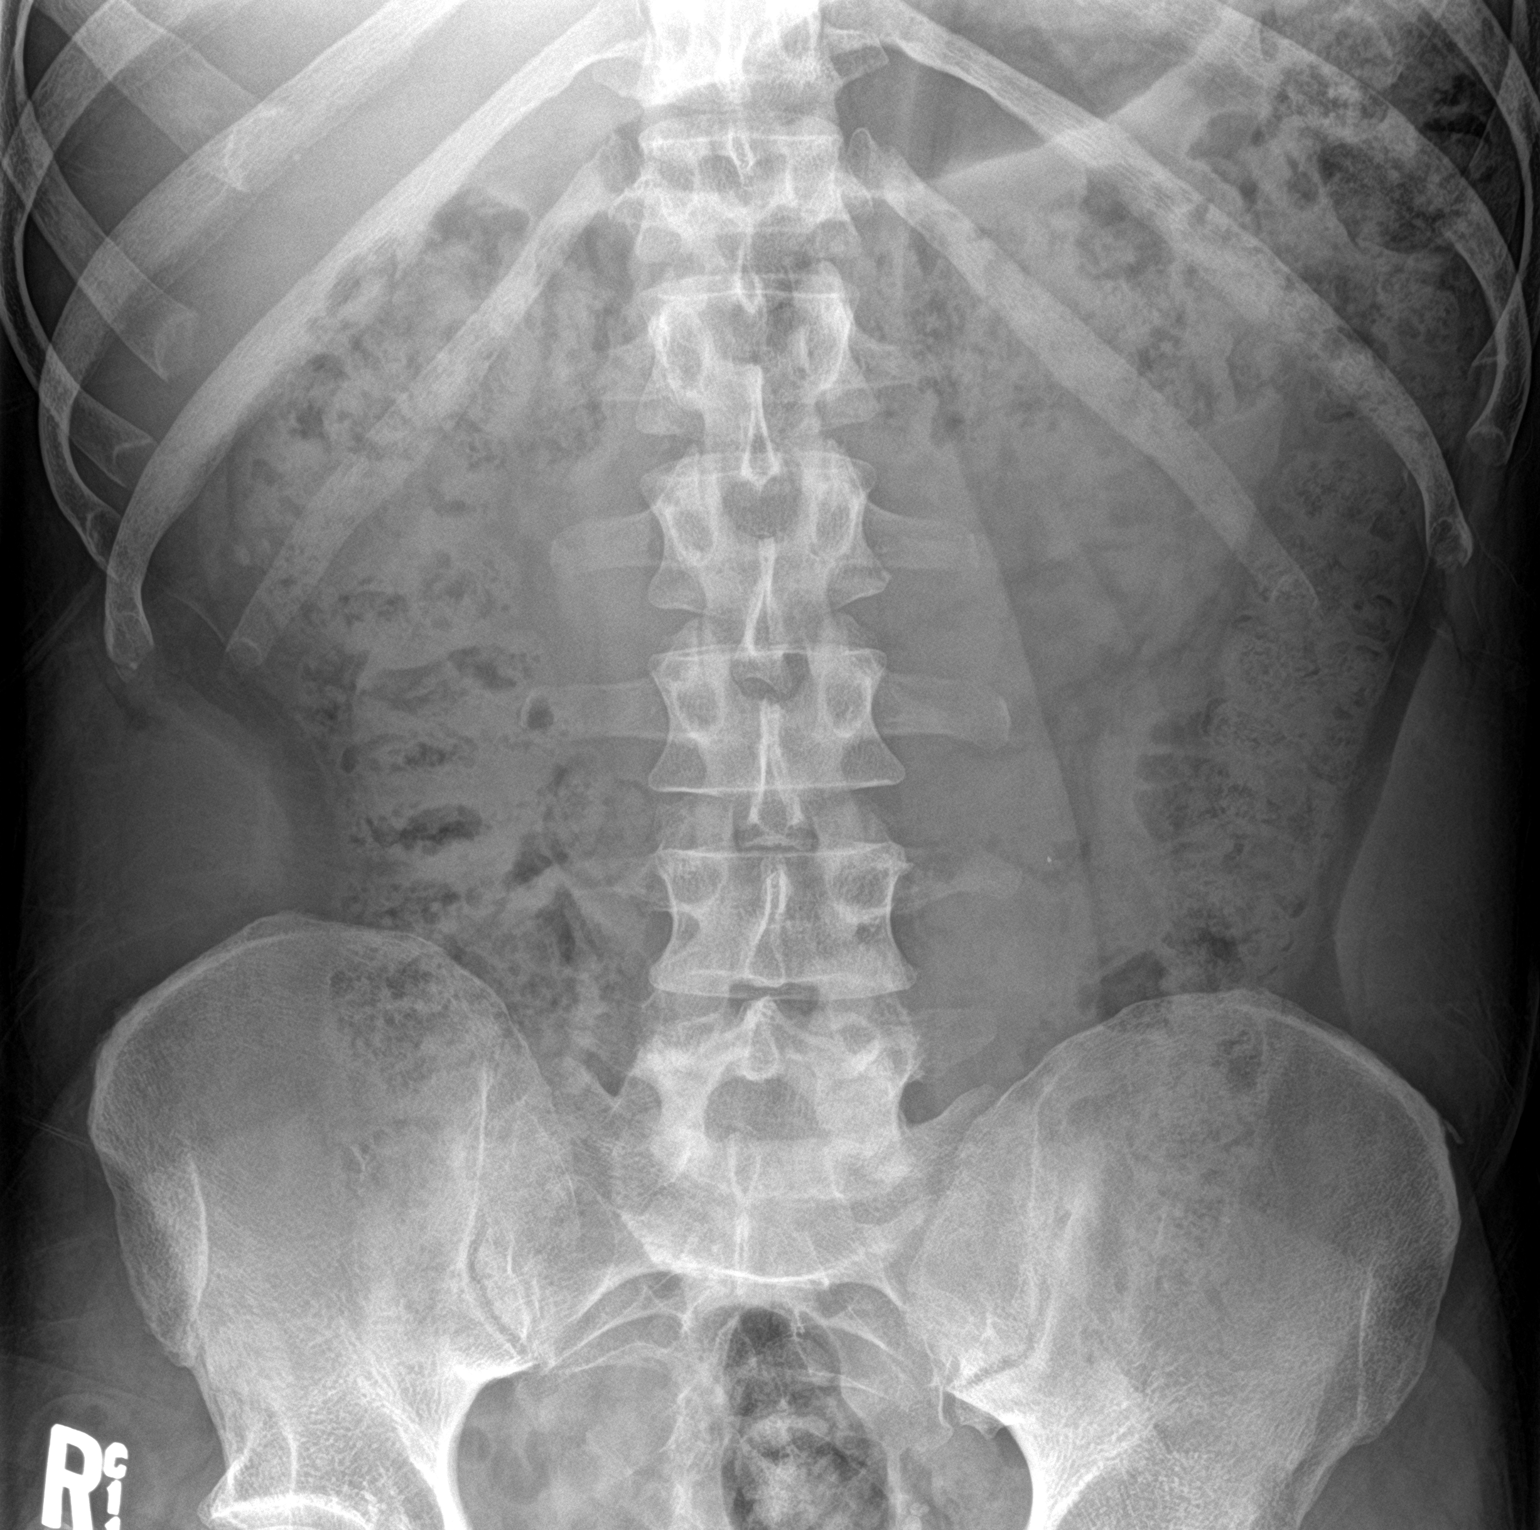

[2 of 2 positions shown; findings below may reference images not displayed]

FINDINGS: Air and stool seen throughout the colon in a nonobstructive pattern.
Moderate to heavy stool burden. No evidence for intraperitoneal free
air on supine view. Lung bases are excluded from field of view.
Multiple radiopaque densities in the pelvis likely represent
vascular phleboliths. No acute osseous abnormality in the visualized
bones.
IMPRESSION: Nonobstructive bowel gas pattern. Moderate to heavy stool burden.

## 2019-06-22 MED ORDER — SULFAMETHOXAZOLE-TRIMETHOPRIM 800-160 MG PO TABS: 1.0000 | ORAL_TABLET | Freq: Two times a day (BID) | ORAL | 0 refills | Status: AC

## 2019-06-22 MED ORDER — PHENAZOPYRIDINE HCL 200 MG PO TABS: 200.0000 mg | ORAL_TABLET | Freq: Three times a day (TID) | ORAL | 0 refills | Status: DC | PRN

## 2019-06-22 NOTE — Progress Notes (Signed)
  [facility]   Progress Note: Sick Visit Provider: Lanae Boast, FNP  SUBJECTIVE:   Dean Robertson is a 58 y.o. male who  has a past medical history of Anxiety, Arthritis, Coronary artery disease, Diabetes mellitus without complication ([place]), H/O heart artery stent, High cholesterol, and Hypertension.. Patient presents today for Back Pain and Flank Pain  Patient with acute right sided flank pain that has occurred intermittently for the past month. He denies burning with urination, bloody stools or previous injury. He has not taken anything to help with the pain.  Review of Systems  Constitutional: Negative.   HENT: Negative.   Eyes: Negative.   Respiratory: Negative.   Cardiovascular: Negative.   Gastrointestinal: Positive for constipation. Negative for blood in stool.  Genitourinary: Positive for flank pain. Negative for dysuria, frequency, hematuria and urgency.  Skin: Negative.   Neurological: Negative.   Psychiatric/Behavioral: Negative.      OBJECTIVE: BP (!) 143/80 (BP Location: Left Arm, Patient Position: Sitting)   Pulse 69   Temp 97.7 F (36.5 C) (Oral)   Resp 18   Wt 163 lb 12.8 oz (74.3 kg)   SpO2 (!) 68%   BMI 24.19 kg/m   Wt Readings from Last 3 Encounters:  06/22/19 163 lb 12.8 oz (74.3 kg)  04/30/19 166 lb (75.3 kg)  03/23/19 174 lb (78.9 kg)     Physical Exam Constitutional:      General: He is not in acute distress.    Appearance: Normal appearance. He is normal weight.  HENT:     Head: Normocephalic and atraumatic.  Cardiovascular:     Rate and Rhythm: Normal rate.  Pulmonary:     Effort: Pulmonary effort is normal.  Abdominal:     General: Abdomen is flat. Bowel sounds are normal.     Palpations: Abdomen is soft.     Tenderness: There is abdominal tenderness (right sided flank). There is no right CVA tenderness.  Skin:    General: Skin is warm and dry.  Neurological:     Mental Status: He is  alert and oriented to person, place, and time.  Psychiatric:        Mood and Affect: Mood normal.        Behavior: Behavior normal.        Thought Content: Thought content normal.        Judgment: Judgment normal.     ASSESSMENT/PLAN:   1. Flank pain, acute KUB ordered to r/o kidney stone. Most likely constipation related. Urine culture ordere and patient started on Bactrim DS.  - Urinalysis Dipstick - Urine Culture - DG Abd 1 View; Future - sulfamethoxazole-trimethoprim (BACTRIM DS) 800-160 MG tablet; Take 1 tablet by mouth 2 (two) times daily for 7 days.  Dispense: 14 tablet; Refill: 0        The patient was given clear instructions to go to ER or return to medical center if symptoms do not improve, worsen or new problems develop. The patient verbalized understanding and agreed with plan of care.   Ms. Doug Sou. Nathaneil Canary, FNP-BC [facility] [address] [place], [place] [address] [phone]     This note has been created with Dragon speech recognition software and smart phrase technology. Any transcriptional errors are unintentional.

## 2019-06-22 NOTE — Patient Instructions (Signed)
I sent 2 medications to the pharmacy for you. One is to help with pain. It will turn your urine orange. The other is an antibiotic. I also want you to go to radiology to get an x ray of the abdomen.   Flank Pain, Adult Flank pain is pain in your side. The flank is the area of your side between your upper belly (abdomen) and your back. The pain may occur over a short time (acute), or it may be long-term or come back often (chronic). It may be mild or very bad. Pain in this area can be caused by many different things. Follow these instructions at home:   Drink enough fluid to keep your pee (urine) clear or pale yellow.  Rest as told by your doctor.  Take over-the-counter and prescription medicines only as told by your doctor.  Keep a journal to keep track of: ? What has caused your flank pain. ? What has made it feel better.  Keep all follow-up visits as told by your doctor. This is important. Contact a doctor if:  Medicine does not help your pain.  You have new symptoms.  Your pain gets worse.  You have a fever.  Your symptoms last longer than 2-3 days.  You have trouble peeing.  You are peeing more often than normal. Get help right away if:  You have trouble breathing.  You are short of breath.  Your belly hurts, or it is swollen or red.  You feel sick to your stomach (nauseous).  You throw up (vomit).  You feel like you will pass out, or you do pass out (faint).  You have blood in your pee. Summary  Flank pain is pain in your side. The flank is the area of your side between your upper belly (abdomen) and your back.  Flank pain may occur over a short time (acute), or it may be long-term or come back often (chronic). It may be mild or very bad.  Pain in this area can be caused by many different things.  Contact your doctor if your symptoms get worse or they last longer than 2-3 days. This information is not intended to replace advice given to you by your health  care provider. Make sure you discuss any questions you have with your health care provider. Document Released: 07/31/2008 Document Revised: 10/04/2017 Document Reviewed: 02/11/2017 Elsevier Patient Education  2020 [employer].

## 2019-06-24 ENCOUNTER — Telehealth: Payer: Self-pay

## 2019-06-24 NOTE — Telephone Encounter (Signed)
Called, no answer. Left a message to call back.  

## 2019-06-24 NOTE — Telephone Encounter (Signed)
Called and spoke with patient, advised that xray showed constipation, no signs of kidney stones. Recommended an otc stool softener such as colace and asked to return if pain does not resolve.

## 2019-06-24 NOTE — Telephone Encounter (Signed)
-----   Message from Lanae Boast, [place] sent at 06/22/2019 11:10 AM EDT ----- Please let patient know that his xray showed constipation. This may be the cause of the abdominal pain. No signs of kidney stones. I recommend an otc stool softner such as dulcolax or colace. If pain is not resolved, you will need to return for further evaluation.

## 2019-06-25 ENCOUNTER — Other Ambulatory Visit: Payer: Self-pay

## 2019-06-25 DIAGNOSIS — I10 Essential (primary) hypertension: Secondary | ICD-10-CM | POA: Insufficient documentation

## 2019-06-25 DIAGNOSIS — Z23 Encounter for immunization: Secondary | ICD-10-CM | POA: Diagnosis not present

## 2019-06-25 DIAGNOSIS — E119 Type 2 diabetes mellitus without complications: Secondary | ICD-10-CM | POA: Insufficient documentation

## 2019-06-25 DIAGNOSIS — Z794 Long term (current) use of insulin: Secondary | ICD-10-CM | POA: Insufficient documentation

## 2019-06-25 DIAGNOSIS — W458XXA Other foreign body or object entering through skin, initial encounter: Secondary | ICD-10-CM | POA: Diagnosis not present

## 2019-06-25 DIAGNOSIS — Y939 Activity, unspecified: Secondary | ICD-10-CM | POA: Diagnosis not present

## 2019-06-25 DIAGNOSIS — Y929 Unspecified place or not applicable: Secondary | ICD-10-CM | POA: Insufficient documentation

## 2019-06-25 DIAGNOSIS — Z955 Presence of coronary angioplasty implant and graft: Secondary | ICD-10-CM | POA: Insufficient documentation

## 2019-06-25 DIAGNOSIS — Z79899 Other long term (current) drug therapy: Secondary | ICD-10-CM | POA: Insufficient documentation

## 2019-06-25 DIAGNOSIS — Z7982 Long term (current) use of aspirin: Secondary | ICD-10-CM | POA: Diagnosis not present

## 2019-06-25 DIAGNOSIS — Z87891 Personal history of nicotine dependence: Secondary | ICD-10-CM | POA: Insufficient documentation

## 2019-06-25 DIAGNOSIS — S61241A Puncture wound with foreign body of left index finger without damage to nail, initial encounter: Secondary | ICD-10-CM | POA: Diagnosis not present

## 2019-06-25 DIAGNOSIS — Y999 Unspecified external cause status: Secondary | ICD-10-CM | POA: Diagnosis not present

## 2019-06-25 DIAGNOSIS — I2510 Atherosclerotic heart disease of native coronary artery without angina pectoris: Secondary | ICD-10-CM | POA: Diagnosis not present

## 2019-06-26 ENCOUNTER — Emergency Department (HOSPITAL_BASED_OUTPATIENT_CLINIC_OR_DEPARTMENT_OTHER)
Admission: EM | Admit: 2019-06-26 | Discharge: 2019-06-26 | Disposition: A | Discharge status: Home / Self Care | Payer: Commercial Managed Care - PPO | Attending: Emergency Medicine | Admitting: Emergency Medicine

## 2019-06-26 ENCOUNTER — Encounter (HOSPITAL_BASED_OUTPATIENT_CLINIC_OR_DEPARTMENT_OTHER): Payer: Self-pay | Admitting: Emergency Medicine

## 2019-06-26 DIAGNOSIS — S6992XA Unspecified injury of left wrist, hand and finger(s), initial encounter: Secondary | ICD-10-CM

## 2019-06-26 LAB — URINE CULTURE

## 2019-06-26 MED ORDER — LIDOCAINE HCL 2 % IJ SOLN
10.0000 mL | Freq: Once | INTRAMUSCULAR | Status: AC
  Administered 2019-06-26: 200 mg
  Filled 2019-06-26: qty 20

## 2019-06-26 MED ORDER — TETANUS-DIPHTH-ACELL PERTUSSIS 5-2.5-18.5 LF-MCG/0.5 IM SUSP
0.5000 mL | Freq: Once | INTRAMUSCULAR | Status: AC
  Administered 2019-06-26: 0.5 mL via INTRAMUSCULAR
  Filled 2019-06-26: qty 0.5

## 2019-06-26 MED ORDER — CEPHALEXIN 250 MG PO CAPS
500.0000 mg | ORAL_CAPSULE | Freq: Once | ORAL | Status: AC
  Administered 2019-06-26: 500 mg via ORAL
  Filled 2019-06-26: qty 2

## 2019-06-26 MED ORDER — CEPHALEXIN 500 MG PO CAPS: 500.0000 mg | ORAL_CAPSULE | Freq: Four times a day (QID) | ORAL | 0 refills | Status: DC

## 2019-06-26 NOTE — ED Triage Notes (Signed)
Pt has fish hoot in left index finger since 8pm

## 2019-06-26 NOTE — ED Provider Notes (Addendum)
[facility] Provider Note: Georgena Spurling, MD, FACEP  CSN: [id number] MRN: [id number] ARRIVAL: 06/25/19 at 2357 ROOM: MH06/[facility]06   CHIEF COMPLAINT  Finger Injury   HISTORY OF PRESENT ILLNESS  06/26/19 12:21 AM Dean Robertson is a 58 y.o. male who has had a fishhook in his left index finger since 8 PM yesterday evening.  He waited in the [facility] regional waiting room but got tired of waiting and came here.  He rates associated pain is a 6 out of 10, worse with movement.    Past Medical History:  Diagnosis Date  . Anxiety   . Arthritis    shoulders  . Coronary artery disease   . Diabetes mellitus without complication ([place])   . H/O heart artery stent    x1  . High cholesterol   . Hypertension     Past Surgical History:  Procedure Laterality Date  . COLONOSCOPY     2012- normal   . CORONARY ANGIOPLASTY WITH STENT PLACEMENT      Family History  Problem Relation Age of Onset  . Hypertension Mother   . Diabetes Father   . Colon polyps Neg Hx   . Colon cancer Neg Hx   . Esophageal cancer Neg Hx   . Rectal cancer Neg Hx   . Stomach cancer Neg Hx     Social History   Tobacco Use  . Smoking status: Former Research scientist (life sciences)  . Smokeless tobacco: Never Used  . Tobacco comment: as teenager  Substance Use Topics  . Alcohol use: Yes    Comment: occ glass of wine   . Drug use: No    Prior to Admission medications   Medication Sig Start Date End Date Taking? Authorizing Provider  acetaminophen (TYLENOL) 500 MG tablet Take 1 tablet (500 mg total) by mouth every 6 (six) hours as needed. 12/08/17   Dorena Dew, FNP  aspirin EC 81 MG tablet Take 81 mg by mouth daily.    [provider]  Blood Glucose Monitoring Suppl (TRUE METRIX AIR GLUCOSE METER) w/Device KIT 1 each 3 (three) times daily by Does not apply route. 09/19/17   Dorena Dew, FNP  cephALEXin (KEFLEX) 500 MG capsule Take 1 capsule (500 mg total) by mouth 4 (four) times daily. 06/26/19    Stephie Xu, MD  gabapentin (NEURONTIN) 300 MG capsule Take 1 capsule (300 mg total) by mouth 3 (three) times daily. 02/05/19   Lanae Boast, FNP  glucose blood (TRUE METRIX BLOOD GLUCOSE TEST) test strip Use as instructed 05/19/18   Lanae Boast, FNP  Insulin Glargine (LANTUS SOLOSTAR) 100 UNIT/ML Solostar Pen Inject 50 Units into the skin at bedtime. 02/05/19   Lanae Boast, FNP  Lancets MISC 1 each 3 (three) times daily by Does not apply route. 09/19/17   Dorena Dew, FNP  lisinopril (PRINIVIL,ZESTRIL) 20 MG tablet Take 1 tablet (20 mg total) by mouth daily. 02/05/19   Lanae Boast, FNP  Menthol, Topical Analgesic, (BIOFREEZE) 4 % GEL Apply 1 application topically 4 (four) times daily as needed. 10/09/18   Lanae Boast, FNP  metFORMIN (GLUCOPHAGE) 1000 MG tablet Take 1 tablet (1,000 mg total) by mouth 2 (two) times daily with a meal. 02/05/19   Lanae Boast, FNP  Multiple Vitamins-Minerals (VITRUM SENIOR) TABS Take by mouth.    [provider]  phenazopyridine (PYRIDIUM) 200 MG tablet Take 1 tablet (200 mg total) by mouth 3 (three) times daily as needed for pain. 06/22/19   Lanae Boast,  FNP  propranolol (INDERAL) 10 MG tablet Take 4 tablets (40 mg total) by mouth every 12 (twelve) hours as needed (anxiety). 02/05/19   Lanae Boast, FNP  simvastatin (ZOCOR) 20 MG tablet Take 1 tablet (20 mg total) by mouth daily at 6 PM. 02/05/19   Lanae Boast, FNP  sulfamethoxazole-trimethoprim (BACTRIM DS) 800-160 MG tablet Take 1 tablet by mouth 2 (two) times daily for 7 days. 06/22/19 06/29/19  Lanae Boast, [place]    Allergies Patient has no known allergies.   REVIEW OF SYSTEMS  Negative except as noted here or in the History of Present Illness.   PHYSICAL EXAMINATION  Initial Vital Signs Blood pressure 108/75, pulse 71, temperature 97.6 F (36.4 C), temperature source Oral, resp. rate 16, height _0  (1.753 m), weight 74.8 kg, SpO2 100 %.  Examination General: Well-developed,  well-nourished male in no acute distress; appearance consistent with age of record HENT: normocephalic; atraumatic Eyes: Normal appearance Neck: supple Heart: regular rate and rhythm Lungs: clear to auscultation bilaterally Abdomen: soft; nondistended; nontender; bowel sounds present Extremities: No deformity; full range of motion; one prong of a treble hook in the lateral aspect of the middle phalanx of the patient's left index finger, finger distally neurovascularly intact Neurologic: Awake, alert and oriented; motor function intact in all extremities and symmetric; no facial droop Skin: Warm and dry Psychiatric: Normal mood and affect   RESULTS  Summary of this visit's results, reviewed by myself:   EKG Interpretation  Date/Time:    Ventricular Rate:    PR Interval:    QRS Duration:   QT Interval:    QTC Calculation:   R Axis:     Text Interpretation:        Laboratory Studies: No results found for this or any previous visit (from the past 24 hour(s)). Imaging Studies: No results found.  ED COURSE and MDM  Nursing notes and initial vitals signs, including pulse oximetry, reviewed.  Vitals:   06/26/19 0005 06/26/19 0006  BP:  108/75  Pulse:  71  Resp:  16  Temp:  97.6 F (36.4 C)  TempSrc:  Oral  SpO2:  100%  Weight: 74.8 kg   Height: _1  (1.753 m)     PROCEDURES   FISH HOOK REMOVAL The involved prong of the treble hook was separated from the lure with wire cutters.  The skin surrounding the remaining prong was prepped with Betadine and anesthetized with approximately 1.5 mL of 1% lidocaine without epinephrine.  A small incision, about 4 mm, was made around the prone.  The hook was then advanced through the patient's skin and removed with hemostats.  The patient tolerated this well and there were no immediate complications.  The patient was given a Tdap and started on Keflex for infection prophylaxis.  ED DIAGNOSES     ICD-10-CM   1. Fish hook injury of  finger, left, initial encounter  S69.92XA        Domenica Weightman, Jenny Reichmann, MD 06/26/19 [id number]    Shanon Rosser, MD 06/26/19 [phone]

## 2019-06-26 NOTE — ED Notes (Signed)
Assisted with removal of fishing hook in the L index finger of Pt. L hand.  Pt. Tolerated well.

## 2019-06-29 ENCOUNTER — Encounter: Payer: Self-pay | Admitting: Family Medicine

## 2019-07-06 ENCOUNTER — Ambulatory Visit: Payer: Commercial Managed Care - PPO | Admitting: Family Medicine

## 2019-07-15 ENCOUNTER — Encounter (HOSPITAL_COMMUNITY): Payer: Self-pay | Admitting: *Deleted

## 2019-07-15 ENCOUNTER — Encounter (HOSPITAL_COMMUNITY): Payer: Self-pay

## 2019-09-13 ENCOUNTER — Emergency Department (HOSPITAL_BASED_OUTPATIENT_CLINIC_OR_DEPARTMENT_OTHER)
Admission: EM | Admit: 2019-09-13 | Discharge: 2019-09-13 | Disposition: A | Discharge status: Home / Self Care | Payer: Commercial Managed Care - PPO | Attending: Emergency Medicine | Admitting: Emergency Medicine

## 2019-09-13 ENCOUNTER — Other Ambulatory Visit: Payer: Self-pay

## 2019-09-13 ENCOUNTER — Encounter (HOSPITAL_BASED_OUTPATIENT_CLINIC_OR_DEPARTMENT_OTHER): Payer: Self-pay | Admitting: Emergency Medicine

## 2019-09-13 DIAGNOSIS — Z5321 Procedure and treatment not carried out due to patient leaving prior to being seen by health care provider: Secondary | ICD-10-CM | POA: Diagnosis not present

## 2019-09-13 DIAGNOSIS — R05 Cough: Secondary | ICD-10-CM | POA: Insufficient documentation

## 2019-09-13 NOTE — ED Notes (Signed)
Called x3 for room with no response.

## 2019-09-13 NOTE — ED Triage Notes (Signed)
Cough and body aches x 1 week. Pending COVID test.

## 2019-09-17 ENCOUNTER — Encounter (HOSPITAL_BASED_OUTPATIENT_CLINIC_OR_DEPARTMENT_OTHER): Payer: Self-pay | Admitting: *Deleted

## 2019-09-17 ENCOUNTER — Other Ambulatory Visit: Payer: Self-pay

## 2019-09-17 ENCOUNTER — Emergency Department (HOSPITAL_BASED_OUTPATIENT_CLINIC_OR_DEPARTMENT_OTHER): Payer: Commercial Managed Care - PPO

## 2019-09-17 ENCOUNTER — Emergency Department (HOSPITAL_BASED_OUTPATIENT_CLINIC_OR_DEPARTMENT_OTHER)
Admission: EM | Admit: 2019-09-17 | Discharge: 2019-09-17 | Disposition: A | Discharge status: Home / Self Care | Payer: Commercial Managed Care - PPO | Attending: Emergency Medicine | Admitting: Emergency Medicine

## 2019-09-17 DIAGNOSIS — Z7982 Long term (current) use of aspirin: Secondary | ICD-10-CM | POA: Diagnosis not present

## 2019-09-17 DIAGNOSIS — U071 COVID-19: Secondary | ICD-10-CM | POA: Diagnosis not present

## 2019-09-17 DIAGNOSIS — I2510 Atherosclerotic heart disease of native coronary artery without angina pectoris: Secondary | ICD-10-CM | POA: Insufficient documentation

## 2019-09-17 DIAGNOSIS — R05 Cough: Secondary | ICD-10-CM | POA: Diagnosis present

## 2019-09-17 DIAGNOSIS — Z79899 Other long term (current) drug therapy: Secondary | ICD-10-CM | POA: Diagnosis not present

## 2019-09-17 DIAGNOSIS — E119 Type 2 diabetes mellitus without complications: Secondary | ICD-10-CM | POA: Insufficient documentation

## 2019-09-17 DIAGNOSIS — Z87891 Personal history of nicotine dependence: Secondary | ICD-10-CM | POA: Insufficient documentation

## 2019-09-17 DIAGNOSIS — Z794 Long term (current) use of insulin: Secondary | ICD-10-CM | POA: Insufficient documentation

## 2019-09-17 DIAGNOSIS — I10 Essential (primary) hypertension: Secondary | ICD-10-CM | POA: Insufficient documentation

## 2019-09-17 DIAGNOSIS — J069 Acute upper respiratory infection, unspecified: Secondary | ICD-10-CM

## 2019-09-17 DIAGNOSIS — Z20822 Contact with and (suspected) exposure to covid-19: Secondary | ICD-10-CM

## 2019-09-17 IMAGING — DX DG CHEST 1V PORT
1 series · 1 of 1 positions shown · non-contrast
Comparison: [DATE].

CLINICAL DATA: Cough.

EXAM:
PORTABLE CHEST 1 VIEW

[chest ap]
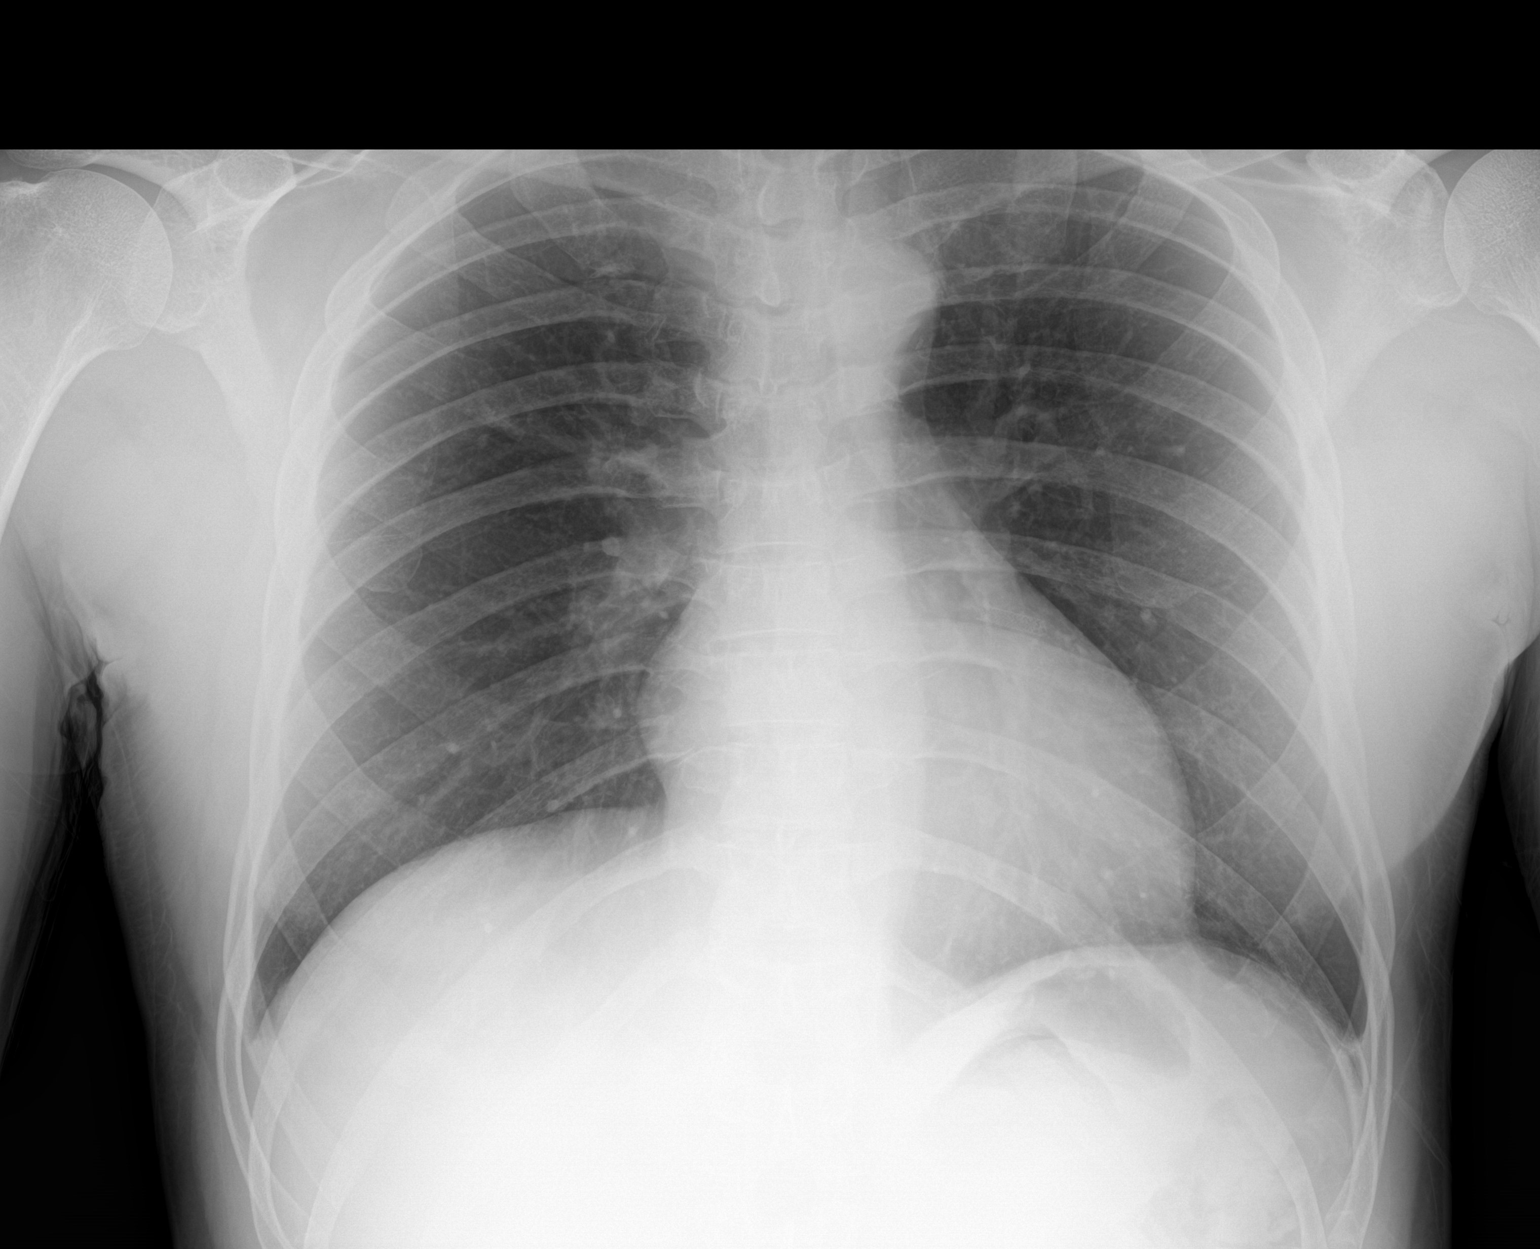

[1 of 1 positions shown; findings below may reference images not displayed]

FINDINGS: The heart size and mediastinal contours are within normal limits.
Both lungs are clear. The visualized skeletal structures are
unremarkable.
IMPRESSION: No active disease.

## 2019-09-17 MED ORDER — BENZONATATE 100 MG PO CAPS: 100.0000 mg | ORAL_CAPSULE | Freq: Three times a day (TID) | ORAL | 0 refills | Status: DC

## 2019-09-17 NOTE — ED Provider Notes (Signed)
[facility] EMERGENCY DEPARTMENT Provider Note   CSN: [id number] Arrival date & time: 09/17/19  1139     History   Chief Complaint Chief Complaint  Patient presents with  . Cough  . Generalized Body Aches    HPI Dean Robertson is a 58 y.o. male history CAD, stent, diabetes, hypertension, high cholesterol.  Patient reports that 09/05/2019 he was tested for COVID-19 virus after being encouraged to by his neighbor, no known exposures.  He reports the day after he had a Covid test he began developing symptoms including generalized body aches, generalized fatigue and cough.  Generalized body aches as a diffuse mild aching sensation constant without aggravating or alleviating factors, no radiation of pain.  Cough intermittent minimally productive with white sputum.  Generalized fatigue without focal weakness.  Reports feeling warm occasionally but has not measured a fever at home, denies headache, neck pain, chest pain/shortness of breath, nausea/vomiting, abdominal pain, diarrhea, dysuria, decreased p.o. intake, extremity pain/swelling, color change, fall/injury or any additional concerns.    HPI  Past Medical History:  Diagnosis Date  . Anxiety   . Arthritis    shoulders  . Coronary artery disease   . Diabetes mellitus without complication ([place])   . H/O heart artery stent    x1  . High cholesterol   . Hypertension     Patient Active Problem List   Diagnosis Date Noted  . Anxiety 02/05/2019  . Essential hypertension 12/08/2017  . Type 2 diabetes mellitus without complication, with long-term current use of insulin ([place]) 12/08/2017  . Chest pain 10/04/2017  . ASHD (arteriosclerotic heart disease) 11/15/2015  . Hyperlipidemia 11/15/2015  . Neuropathy 11/15/2015  . Sprain/strain 11/27/2013    Past Surgical History:  Procedure Laterality Date  . COLONOSCOPY     2012- normal   . CORONARY ANGIOPLASTY WITH STENT PLACEMENT          Home Medications    Prior  to Admission medications   Medication Sig Start Date End Date Taking? Authorizing Provider  acetaminophen (TYLENOL) 500 MG tablet Take 1 tablet (500 mg total) by mouth every 6 (six) hours as needed. 12/08/17   Dorena Dew, FNP  aspirin EC 81 MG tablet Take 81 mg by mouth daily.    [provider]  benzonatate (TESSALON) 100 MG capsule Take 1 capsule (100 mg total) by mouth every 8 (eight) hours. 09/17/19   Nuala Alpha A, PA-C  Blood Glucose Monitoring Suppl (TRUE METRIX AIR GLUCOSE METER) w/Device KIT 1 each 3 (three) times daily by Does not apply route. 09/19/17   Dorena Dew, FNP  cephALEXin (KEFLEX) 500 MG capsule Take 1 capsule (500 mg total) by mouth 4 (four) times daily. 06/26/19   Molpus, John, MD  gabapentin (NEURONTIN) 300 MG capsule Take 1 capsule (300 mg total) by mouth 3 (three) times daily. 02/05/19   Lanae Boast, FNP  glucose blood (TRUE METRIX BLOOD GLUCOSE TEST) test strip Use as instructed 05/19/18   Lanae Boast, FNP  Insulin Glargine (LANTUS SOLOSTAR) 100 UNIT/ML Solostar Pen Inject 50 Units into the skin at bedtime. 02/05/19   Lanae Boast, FNP  Lancets MISC 1 each 3 (three) times daily by Does not apply route. 09/19/17   Dorena Dew, FNP  lisinopril (PRINIVIL,ZESTRIL) 20 MG tablet Take 1 tablet (20 mg total) by mouth daily. 02/05/19   Lanae Boast, FNP  Menthol, Topical Analgesic, (BIOFREEZE) 4 % GEL Apply 1 application topically 4 (four) times daily as needed. 10/09/18  Lanae Boast, FNP  metFORMIN (GLUCOPHAGE) 1000 MG tablet Take 1 tablet (1,000 mg total) by mouth 2 (two) times daily with a meal. 02/05/19   Lanae Boast, FNP  Multiple Vitamins-Minerals (VITRUM SENIOR) TABS Take by mouth.    [provider]  phenazopyridine (PYRIDIUM) 200 MG tablet Take 1 tablet (200 mg total) by mouth 3 (three) times daily as needed for pain. 06/22/19   Lanae Boast, FNP  propranolol (INDERAL) 10 MG tablet Take 4 tablets (40 mg total) by mouth every 12  (twelve) hours as needed (anxiety). 02/05/19   Lanae Boast, FNP  simvastatin (ZOCOR) 20 MG tablet Take 1 tablet (20 mg total) by mouth daily at 6 PM. 02/05/19   Lanae Boast, FNP    Family History Family History  Problem Relation Age of Onset  . Hypertension Mother   . Diabetes Father   . Colon polyps Neg Hx   . Colon cancer Neg Hx   . Esophageal cancer Neg Hx   . Rectal cancer Neg Hx   . Stomach cancer Neg Hx     Social History Social History   Tobacco Use  . Smoking status: Former Research scientist (life sciences)  . Smokeless tobacco: Never Used  . Tobacco comment: as teenager  Substance Use Topics  . Alcohol use: Yes    Comment: occ glass of wine   . Drug use: No     Allergies   Patient has no known allergies.   Review of Systems Review of Systems Ten systems are reviewed and are negative for acute change except as noted in the HPI   Physical Exam Updated Vital Signs BP (!) 141/87   Pulse 68   Temp 98.5 F (36.9 C) (Oral)   Resp 18   Ht 5' 8"  (1.727 m)   Wt 78 kg   SpO2 99%   BMI 26.15 kg/m   Physical Exam Constitutional:      General: He is not in acute distress.    Appearance: Normal appearance. He is well-developed. He is not ill-appearing or diaphoretic.  HENT:     Head: Normocephalic and atraumatic.     Right Ear: External ear normal.     Left Ear: External ear normal.     Nose: Nose normal.     Mouth/Throat:     Mouth: Mucous membranes are moist.     Pharynx: Oropharynx is clear.  Eyes:     General: Vision grossly intact. Gaze aligned appropriately.     Pupils: Pupils are equal, round, and reactive to light.  Neck:     Musculoskeletal: Full passive range of motion without pain, normal range of motion and neck supple.     Trachea: Trachea and phonation normal. No tracheal deviation.     Meningeal: Brudzinski's sign absent.  Cardiovascular:     Rate and Rhythm: Normal rate and regular rhythm.     Pulses: Normal pulses.          Dorsalis pedis pulses are 2+ on  the right side and 2+ on the left side.     Heart sounds: Normal heart sounds.  Pulmonary:     Effort: Pulmonary effort is normal. No respiratory distress.     Breath sounds: Normal breath sounds.  Abdominal:     General: There is no distension.     Palpations: Abdomen is soft.     Tenderness: There is no abdominal tenderness. There is no guarding or rebound.  Musculoskeletal: Normal range of motion.     Right lower leg:  Normal.     Left lower leg: Normal.  Feet:     Right foot:     Protective Sensation: 3 sites tested. 3 sites sensed.     Left foot:     Protective Sensation: 3 sites tested. 3 sites sensed.  Skin:    General: Skin is warm and dry.  Neurological:     Mental Status: He is alert.     GCS: GCS eye subscore is 4. GCS verbal subscore is 5. GCS motor subscore is 6.     Comments: Speech is clear and goal oriented, follows commands Major Cranial nerves without deficit, no facial droop Moves extremities without ataxia, coordination intact  Psychiatric:        Behavior: Behavior normal.      ED Treatments / Results  Labs (all labs ordered are listed, but only abnormal results are displayed) Labs Reviewed  SARS CORONAVIRUS 2 (TAT 6-24 HRS)    EKG None  Radiology Dg Chest Portable 1 View  Result Date: 09/17/2019 CLINICAL DATA:  Cough. EXAM: PORTABLE CHEST 1 VIEW COMPARISON:  October 04, 2017. FINDINGS: The heart size and mediastinal contours are within normal limits. Both lungs are clear. The visualized skeletal structures are unremarkable. IMPRESSION: No active disease. Electronically Signed   By: Marijo Conception M.D.   On: 09/17/2019 12:37    Procedures Procedures (including critical care time)  Medications Ordered in ED Medications - No data to display   Initial Impression / Assessment and Plan / ED Course  I have reviewed the triage vital signs and the nursing notes.  Pertinent labs & imaging results that were available during my care of the patient  were reviewed by me and considered in my medical decision making (see chart for details).    58 year old male presenting today with URI-like symptoms, on his 11th day of illness.  He reports cough, generalized body aches and generalized fatigue without other complaints.  On examination he is overall well-appearing and in no acute distress, afebrile, no tachycardia or hypoxia on room air, normal blood pressure and no tachypnea or increased work of breathing.  No cranial nerve deficits, no meningeal signs, heart regular rate and rhythm without murmur, lungs clear to auscultation bilaterally, abdomen soft nontender without peritoneal signs, neurovascular intact to all 4 extremities without sign of DVT.  He was tested for Covid the day before onset of his symptoms with a neighbor despite no known exposures and cannot access that test.  Plan of care is to obtain chest x-ray and outpatient Covid test this patient anticipate discharge. - CXR:  IMPRESSION:  No active disease.   No evidence of pneumonia, there is no indication for antibiotics at this time.  Patient encouraged to maintain oral hydration and use home symptomatic treatments for his URI he is aware that his Covid test is pending and will be available in 1-2 days online he will continue self quarantine until symptom-free x7 days.  He will follow-up with his PCP this week for reevaluation and return to the emergency department for any new or worsening symptoms.  Tessalon prescribed for cough.  There is no indication for further testing or admission at this time.  At this time there does not appear to be any evidence of an acute emergency medical condition and the patient appears stable for discharge with appropriate outpatient follow up. Diagnosis was discussed with patient who verbalizes understanding of care plan and is agreeable to discharge. I have discussed return precautions with patient who  verbalizes understanding of return precautions. Patient  encouraged to follow-up with their PCP. All questions answered.  Patient's case discussed with Dr. Maryan Rued who agrees with plan to discharge with follow-up.   [place] Lebeau was evaluated in Emergency Department on 09/17/2019 for the symptoms described in the history of present illness. He was evaluated in the context of the global COVID-19 pandemic, which necessitated consideration that the patient might be at risk for infection with the SARS-CoV-2 virus that causes COVID-19. Institutional protocols and algorithms that pertain to the evaluation of patients at risk for COVID-19 are in a state of rapid change based on information released by regulatory bodies including the CDC and federal and state organizations. These policies and algorithms were followed during the patient's care in the ED.   Note: Portions of this report may have been transcribed using voice recognition software. Every effort was made to ensure accuracy; however, inadvertent computerized transcription errors may still be present. Final Clinical Impressions(s) / ED Diagnoses   Final diagnoses:  Upper respiratory tract infection, unspecified type  Suspected COVID-19 virus infection    ED Discharge Orders         Ordered    benzonatate (TESSALON) 100 MG capsule  Every 8 hours     09/17/19 1302           Gari Crown 09/17/19 1306    Blanchie Dessert, MD 09/17/19 1307

## 2019-09-17 NOTE — ED Triage Notes (Signed)
For 8 days he has had a cough and body aches. He had a covid test 2 weeks ago lost the number to call to find out his results.

## 2019-09-17 NOTE — ED Notes (Signed)
XR at bedside

## 2019-09-17 NOTE — Discharge Instructions (Addendum)
You have been diagnosed today with upper respiratory tract infection, suspected COVID-19 viral infection.  At this time there does not appear to be the presence of an emergent medical condition, however there is always the potential for conditions to change. Please read and follow the below instructions.  Please return to the Emergency Department immediately for any new or worsening symptoms or if your symptoms do not improve within 3 days. Please be sure to follow up with your Primary Care Provider within one week regarding your visit today; please call their office to schedule an appointment even if you are feeling better for a follow-up visit. You have been tested for the COVID-19 virus today, your test results will be available on your MyChart account in 1-2 days.  Please continue to self quarantine until symptom-free x7 days despite testing results as there is always a possibility of a false negative test. You may use the medication Tessalon as prescribed to help with your cough, please be sure to drink plenty water and get plenty of rest.  Call your primary care provider today to schedule a follow-up appointment.  Get help right away if: You have shortness of breath or chest pain You have very bad or constant: Headache. Ear pain. Pain in your forehead, behind your eyes, and over your cheekbones (sinus pain). Chest pain. You have long-lasting (chronic) lung disease along with any of these: Wheezing. Long-lasting cough. Coughing up blood. A change in your usual mucus. You have a stiff neck. You have changes in your: Vision. Hearing. Thinking. Mood. You have any new/concerning or worsening of symptoms  Please read the additional information packets attached to your discharge summary.  Do not take your medicine if  develop an itchy rash, swelling in your mouth or lips, or difficulty breathing; call 911 and seek immediate emergency medical attention if this occurs.  Note: Portions  of this text may have been transcribed using voice recognition software. Every effort was made to ensure accuracy; however, inadvertent computerized transcription errors may still be present.

## 2019-09-18 LAB — SARS CORONAVIRUS 2 (TAT 6-24 HRS): SARS Coronavirus 2: POSITIVE — AB

## 2019-09-22 ENCOUNTER — Encounter: Payer: Self-pay | Admitting: Family Medicine

## 2019-09-22 ENCOUNTER — Ambulatory Visit (INDEPENDENT_AMBULATORY_CARE_PROVIDER_SITE_OTHER): Payer: Commercial Managed Care - PPO | Admitting: Family Medicine

## 2019-09-22 ENCOUNTER — Other Ambulatory Visit: Payer: Self-pay

## 2019-09-22 DIAGNOSIS — R438 Other disturbances of smell and taste: Secondary | ICD-10-CM

## 2019-09-22 DIAGNOSIS — U071 COVID-19: Secondary | ICD-10-CM

## 2019-09-22 DIAGNOSIS — Z7189 Other specified counseling: Secondary | ICD-10-CM | POA: Diagnosis not present

## 2019-09-22 DIAGNOSIS — R531 Weakness: Secondary | ICD-10-CM

## 2019-09-22 NOTE — Progress Notes (Signed)
  Patient [facility]   Virtual Visit via Telephone Note  I connected with Dean Robertson on 09/22/19 at 10:00 AM EST by telephone and verified that I am speaking with the correct person using two identifiers.   I discussed the limitations, risks, security and privacy concerns of performing an evaluation and management service by telephone and the availability of in person appointments. I also discussed with the patient that there may be a patient responsible charge related to this service. The patient expressed understanding and agreed to proceed.   History of Present Illness: Dean Robertson, a 58 year old male with a medical history of type 2 diabetes mellitus and essential hypertension has recently been diagnosed with COVID-19 infection.  Patient is concerned about current symptoms.  He is experiencing loss of taste and smell, weakness, and fatigue.  He says that symptoms have improved over the past several days.  He has not used any over-the-counter interventions to alleviate current symptoms.  Patient states that he has been resting and hydrating consistently.  He is also isolating in his home, he lives alone.  He denies headache, blurred vision, dizziness, shortness of breath, persistent cough, sore throat, chest pain, urinary symptoms, nausea, vomiting, or diarrhea.  Patient also denies fever and chills.    Assessment and Plan: Advice given about COVID-19 virus infection Continue to isolate at home for specified amount of time.  Follow-up in Covid testing center in 2 weeks. Patient advised to continue to hydrate consistently with around 64 ounces of water per day. Recommend balanced diet high in vitamin C. Add zinc supplement and/or multivitamin. Recommend OTC Tylenol for fever, mild aches and pains, and headache.  Continue to get lots of rest Continue to wash hands consistently. There are no shortcuts to recovery, follow CDC guidelines at length.   If symptoms worsen, report to closest health system for further treatment and evaluation.   Follow Up Instructions:    I discussed the assessment and treatment plan with the patient. The patient was provided an opportunity to ask questions and all were answered. The patient agreed with the plan and demonstrated an understanding of the instructions.   The patient was advised to call back or seek an in-person evaluation if the symptoms worsen or if the condition fails to improve as anticipated.  I provided 10 minutes of non-face-to-face time during this encounter.  Dean Pounds  APRN, MSN, FNP-C [facility] [address] [place], [place] [address] [phone]

## 2019-10-10 ENCOUNTER — Other Ambulatory Visit: Payer: Self-pay | Admitting: Family Medicine

## 2019-10-10 DIAGNOSIS — M545 Low back pain, unspecified: Secondary | ICD-10-CM

## 2019-10-10 DIAGNOSIS — M25511 Pain in right shoulder: Secondary | ICD-10-CM

## 2019-10-11 ENCOUNTER — Other Ambulatory Visit: Payer: Self-pay | Admitting: Family Medicine

## 2019-10-11 DIAGNOSIS — I10 Essential (primary) hypertension: Secondary | ICD-10-CM

## 2019-10-12 NOTE — Telephone Encounter (Signed)
Refill request for Tramadol. Please advise.  

## 2020-01-03 ENCOUNTER — Other Ambulatory Visit: Payer: Self-pay | Admitting: Family Medicine

## 2020-01-03 DIAGNOSIS — G629 Polyneuropathy, unspecified: Secondary | ICD-10-CM

## 2020-04-11 ENCOUNTER — Emergency Department (HOSPITAL_BASED_OUTPATIENT_CLINIC_OR_DEPARTMENT_OTHER): Payer: Commercial Managed Care - PPO

## 2020-04-11 ENCOUNTER — Other Ambulatory Visit: Payer: Self-pay

## 2020-04-11 ENCOUNTER — Encounter (HOSPITAL_BASED_OUTPATIENT_CLINIC_OR_DEPARTMENT_OTHER): Payer: Self-pay | Admitting: *Deleted

## 2020-04-11 ENCOUNTER — Emergency Department (HOSPITAL_BASED_OUTPATIENT_CLINIC_OR_DEPARTMENT_OTHER)
Admission: EM | Admit: 2020-04-11 | Discharge: 2020-04-11 | Disposition: A | Discharge status: Home / Self Care | Payer: Commercial Managed Care - PPO | Attending: Emergency Medicine | Admitting: Emergency Medicine

## 2020-04-11 DIAGNOSIS — M25511 Pain in right shoulder: Secondary | ICD-10-CM | POA: Diagnosis present

## 2020-04-11 DIAGNOSIS — E7849 Other hyperlipidemia: Secondary | ICD-10-CM

## 2020-04-11 DIAGNOSIS — Z794 Long term (current) use of insulin: Secondary | ICD-10-CM | POA: Diagnosis not present

## 2020-04-11 DIAGNOSIS — M545 Low back pain: Secondary | ICD-10-CM | POA: Insufficient documentation

## 2020-04-11 DIAGNOSIS — E1165 Type 2 diabetes mellitus with hyperglycemia: Secondary | ICD-10-CM | POA: Diagnosis not present

## 2020-04-11 DIAGNOSIS — Z87891 Personal history of nicotine dependence: Secondary | ICD-10-CM | POA: Insufficient documentation

## 2020-04-11 DIAGNOSIS — I10 Essential (primary) hypertension: Secondary | ICD-10-CM

## 2020-04-11 DIAGNOSIS — I2510 Atherosclerotic heart disease of native coronary artery without angina pectoris: Secondary | ICD-10-CM | POA: Insufficient documentation

## 2020-04-11 DIAGNOSIS — E119 Type 2 diabetes mellitus without complications: Secondary | ICD-10-CM

## 2020-04-11 DIAGNOSIS — I119 Hypertensive heart disease without heart failure: Secondary | ICD-10-CM | POA: Insufficient documentation

## 2020-04-11 DIAGNOSIS — Z955 Presence of coronary angioplasty implant and graft: Secondary | ICD-10-CM | POA: Insufficient documentation

## 2020-04-11 DIAGNOSIS — Z79899 Other long term (current) drug therapy: Secondary | ICD-10-CM | POA: Insufficient documentation

## 2020-04-11 LAB — URINALYSIS, ROUTINE W REFLEX MICROSCOPIC
Bilirubin Urine: NEGATIVE
Glucose, UA: >=500 mg/dL — AB
Hgb urine dipstick: NEGATIVE
Ketones, ur: NEGATIVE mg/dL
Leukocytes,Ua: NEGATIVE
Nitrite: NEGATIVE
Protein, ur: NEGATIVE mg/dL
Specific Gravity, Urine: >1.03 — ABNORMAL HIGH (ref 1.005–1.030)
pH: 5.5 (ref 5.0–8.0)

## 2020-04-11 LAB — URINALYSIS, MICROSCOPIC (REFLEX)

## 2020-04-11 IMAGING — CR DG SHOULDER 2+V*R*
3 series · 3 of 3 positions shown · non-contrast
Comparison: [DATE]

CLINICAL DATA: Right shoulder pain.

EXAM:
RIGHT SHOULDER - 2+ VIEW

[w shoulder grashey right]
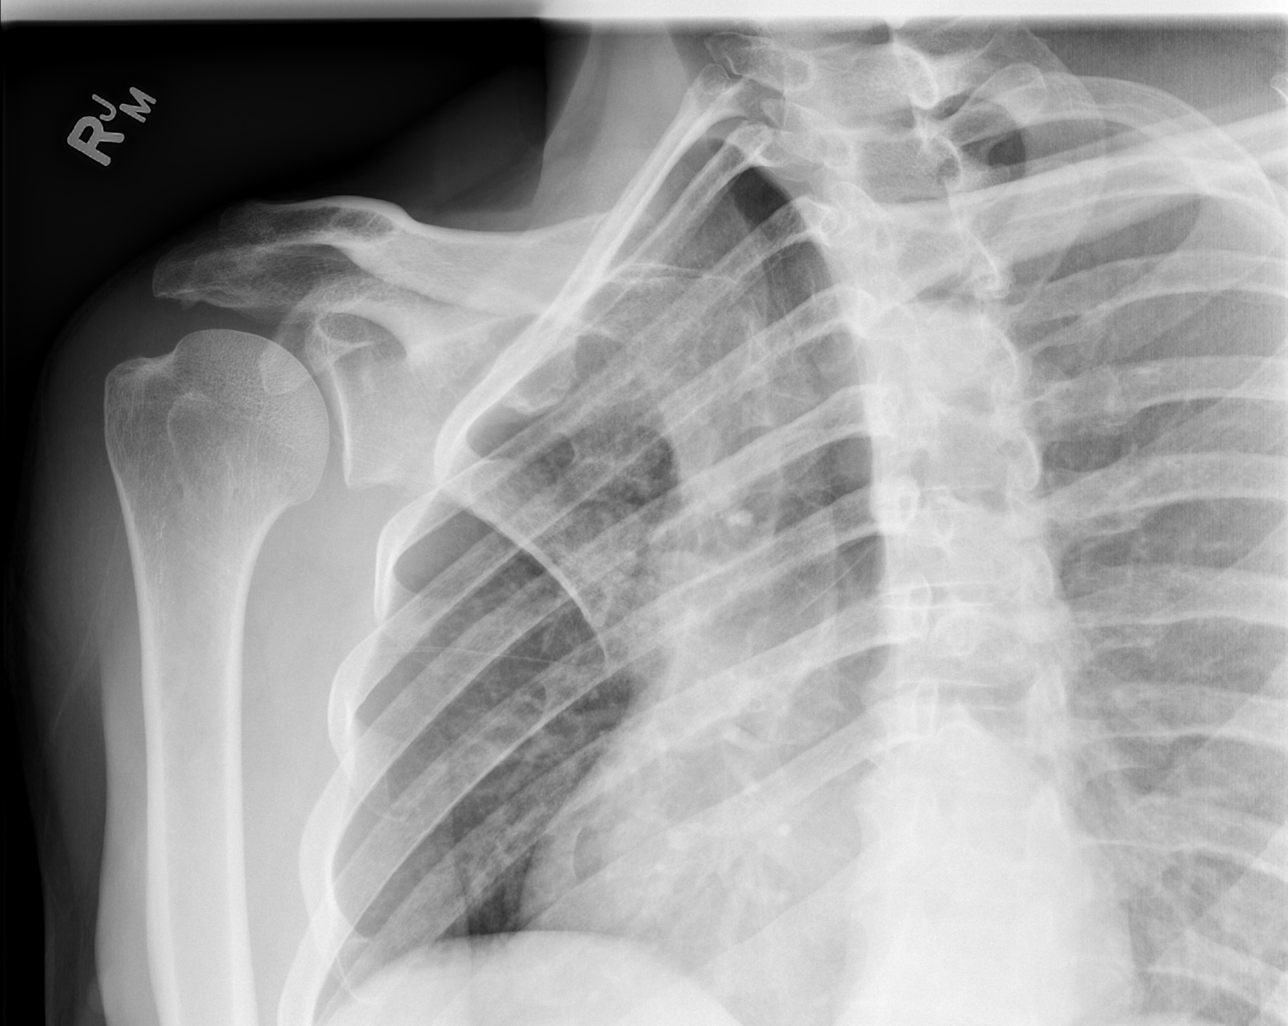

[w shoulder y view right]
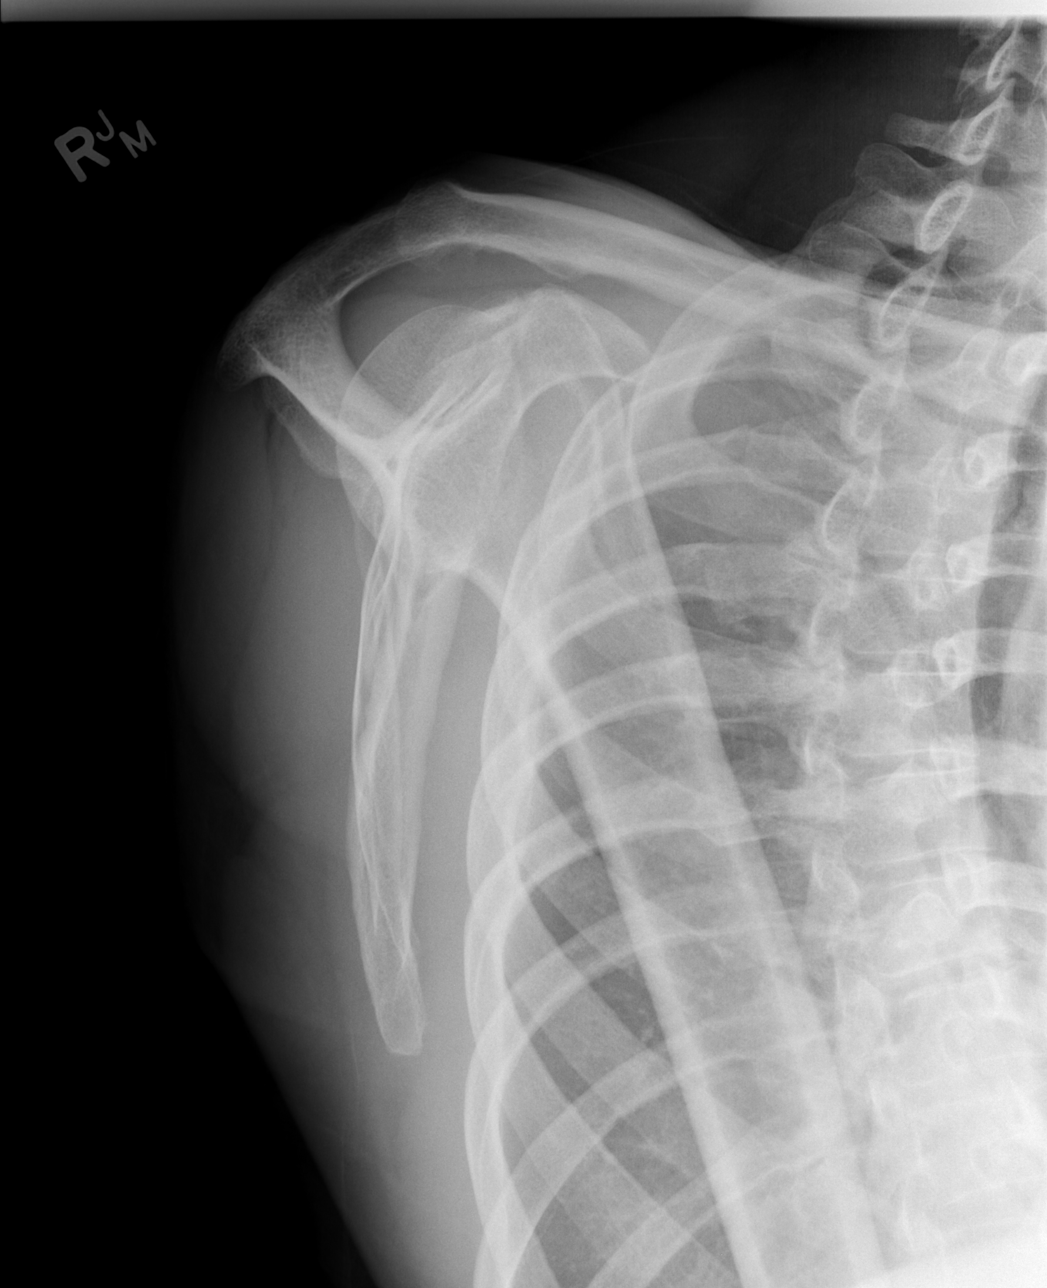

[w shoulder axillary right *]
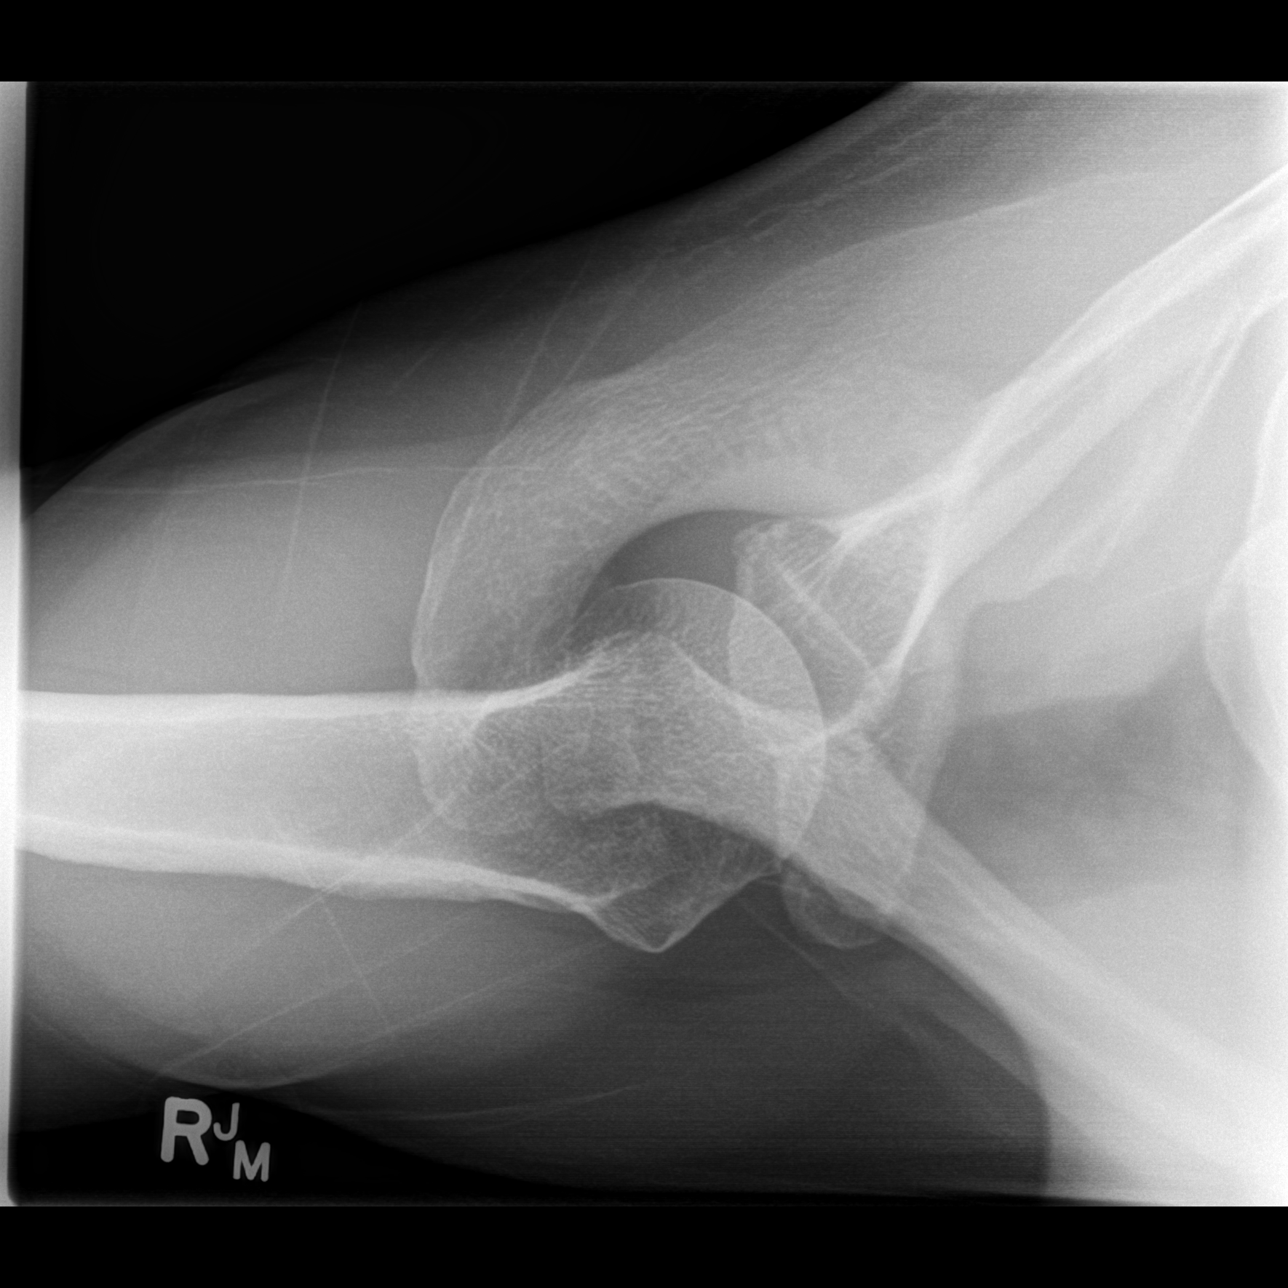

[3 of 3 positions shown; findings below may reference images not displayed]

FINDINGS: Degenerative changes in the AC joint with joint space narrowing and
spurring. Glenohumeral joint is maintained. No acute bony
abnormality. Specifically, no fracture, subluxation, or dislocation.
Soft tissues are intact.
IMPRESSION: Degenerative changes in the right AC joint. No acute bony
abnormality.

## 2020-04-11 MED ORDER — METHOCARBAMOL 500 MG PO TABS: 500.0000 mg | ORAL_TABLET | Freq: Every evening | ORAL | 0 refills | Status: DC | PRN

## 2020-04-11 MED ORDER — NAPROXEN 375 MG PO TABS: 375.0000 mg | ORAL_TABLET | Freq: Two times a day (BID) | ORAL | 0 refills | Status: DC

## 2020-04-11 MED ORDER — KETOROLAC TROMETHAMINE 30 MG/ML IJ SOLN
15.0000 mg | Freq: Once | INTRAMUSCULAR | Status: AC
  Administered 2020-04-11: 15 mg via INTRAMUSCULAR
  Filled 2020-04-11: qty 1

## 2020-04-11 MED ORDER — SIMVASTATIN 20 MG PO TABS: 20.0000 mg | ORAL_TABLET | Freq: Every day | ORAL | 0 refills | Status: DC

## 2020-04-11 MED ORDER — LANTUS SOLOSTAR 100 UNIT/ML ~~LOC~~ SOPN: 50.0000 [IU] | PEN_INJECTOR | Freq: Every day | SUBCUTANEOUS | 0 refills | Status: DC

## 2020-04-11 MED ORDER — LISINOPRIL 20 MG PO TABS: ORAL_TABLET | ORAL | 0 refills | Status: DC

## 2020-04-11 NOTE — Discharge Instructions (Signed)
Continue taking home medications as prescribed.  I refilled them for 1 month, but she will need to follow-up with your primary care doctor for further refills. Take naproxen 2 times a day with meals.  Do not take other anti-inflammatories at the same time (Advil, Motrin, ibuprofen, Aleve). You may supplement with Tylenol if you need further pain control. Use ice packs or heating pads if this helps control your pain. Use Robaxin as needed for muscle stiffness or soreness.  Have caution, this may make you tired or groggy.  Do not drive or operate heavy machinery while taking this medicine. Follow-up with the sports medicine doctor listed below for further evaluation of your shoulder as needed. You may use muscle creams/patches such a Salonpas, icy hot, BenGay, Biofreeze to help with pain. Return to the emergency room with any new, worsening, concerning symptoms.

## 2020-04-11 NOTE — ED Provider Notes (Signed)
[name] Provider Note   CSN: [id number] Arrival date & time: 04/11/20  1044     History Chief Complaint  Patient presents with  . Back Pain  . Shoulder pain    Dean Robertson is a 59 y.o. male presenting for evaluation of right shoulder pain and low back pain.  Patient states that the past month, he has had gradually worsening right shoulder pain.  Pain is constant, worse with movement of his shoulder and his arm.  When he moves his arm, he has shooting pain from his shoulder to his elbow.  He denies numbness or tingling.  He denies fall, trauma, or injury.  He has not taken anything for it including Tylenol ibuprofen.  He is trying to follow-up with his primary care doctor, but not been able to get an appointment.  He denies previous injury to the shoulder, however per chart review, patient has been seen for right shoulder pain and previous ER visits.  Additionally, patient reporting bilateral low back pain.  This is also been present for about a month, but not as long as the shoulder pain.  He denies fevers, chills, history of cancer, history of IVDU, abdominal pain, nausea, vomiting, abnormal bowel movements. Pt states for the past 2 months, he has had urinary frequency, no dysuria or hematuria.  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  Patient states he ran out of all his medications a week ago, ran out of his insulin 2 weeks ago.  HPI     Past Medical History:  Diagnosis Date  . Anxiety   . Arthritis    shoulders  . Coronary artery disease   . Diabetes mellitus without complication ([place])   . H/O heart artery stent    x1  . High cholesterol   . Hypertension     Patient Active Problem List   Diagnosis Date Noted  . Anxiety 02/05/2019  . Essential hypertension 12/08/2017  . Type 2 diabetes mellitus without complication, with long-term current use of insulin ([place]) 12/08/2017  . Chest pain 10/04/2017  . ASHD (arteriosclerotic heart  disease) 11/15/2015  . Hyperlipidemia 11/15/2015  . Neuropathy 11/15/2015  . Sprain/strain 11/27/2013    Past Surgical History:  Procedure Laterality Date  . COLONOSCOPY     2012- normal   . CORONARY ANGIOPLASTY WITH STENT PLACEMENT         Family History  Problem Relation Age of Onset  . Hypertension Mother   . Diabetes Father   . Colon polyps Neg Hx   . Colon cancer Neg Hx   . Esophageal cancer Neg Hx   . Rectal cancer Neg Hx   . Stomach cancer Neg Hx     Social History   Tobacco Use  . Smoking status: Former Research scientist (life sciences)  . Smokeless tobacco: Never Used  . Tobacco comment: as teenager  Substance Use Topics  . Alcohol use: Yes    Comment: occ glass of wine   . Drug use: Not Currently    Types: Marijuana    Home Medications Prior to Admission medications   Medication Sig Start Date End Date Taking? Authorizing Provider  acetaminophen (TYLENOL) 500 MG tablet Take 1 tablet (500 mg total) by mouth every 6 (six) hours as needed. Patient not taking: Reported on 09/22/2019 12/08/17   Dorena Dew, FNP  aspirin EC 81 MG tablet Take 81 mg by mouth daily.    [provider]  benzonatate (TESSALON) 100 MG capsule Take  1 capsule (100 mg total) by mouth every 8 (eight) hours. 09/17/19   Nuala Alpha A, PA-C  Blood Glucose Monitoring Suppl (TRUE METRIX AIR GLUCOSE METER) w/Device KIT 1 each 3 (three) times daily by Does not apply route. 09/19/17   Dorena Dew, FNP  gabapentin (NEURONTIN) 300 MG capsule TAKE 1 CAPSULE(300 MG) BY MOUTH THREE TIMES DAILY 01/04/20   Dorena Dew, FNP  glucose blood (TRUE METRIX BLOOD GLUCOSE TEST) test strip Use as instructed 05/19/18   Lanae Boast, FNP  insulin glargine (LANTUS SOLOSTAR) 100 UNIT/ML Solostar Pen Inject 50 Units into the skin at bedtime. 04/11/20   Jerzey Komperda, PA-C  Lancets MISC 1 each 3 (three) times daily by Does not apply route. 09/19/17   Dorena Dew, FNP  lisinopril (ZESTRIL) 20 MG tablet TAKE 1  TABLET(20 MG) BY MOUTH DAILY 04/11/20   Brixton Franko, PA-C  Menthol, Topical Analgesic, (BIOFREEZE) 4 % GEL Apply 1 application topically 4 (four) times daily as needed. 10/09/18   Lanae Boast, FNP  metFORMIN (GLUCOPHAGE) 1000 MG tablet Take 1 tablet (1,000 mg total) by mouth 2 (two) times daily with a meal. Patient not taking: Reported on 09/22/2019 02/05/19   Lanae Boast, FNP  methocarbamol (ROBAXIN) 500 MG tablet Take 1 tablet (500 mg total) by mouth at bedtime as needed for muscle spasms. 04/11/20   Joydan Gretzinger, PA-C  Multiple Vitamins-Minerals (VITRUM SENIOR) TABS Take by mouth.    [provider]  naproxen (NAPROSYN) 375 MG tablet Take 1 tablet (375 mg total) by mouth 2 (two) times daily with a meal. 04/11/20   Darcia Lampi, PA-C  propranolol (INDERAL) 10 MG tablet TAKE 4 TABLETS(40 MG) BY MOUTH EVERY 12 HOURS AS NEEDED FOR ANXIETY 01/04/20   Dorena Dew, FNP  simvastatin (ZOCOR) 20 MG tablet Take 1 tablet (20 mg total) by mouth daily at 6 PM. 04/11/20   Chanoch Mccleery, PA-C  traMADol (ULTRAM) 50 MG tablet TAKE 1 TABLET(50 MG) BY MOUTH EVERY 8 HOURS FOR UP TO 5 DAYS AS NEEDED 10/12/19   Dorena Dew, FNP    Allergies    Patient has no known allergies.  Review of Systems   Review of Systems  Musculoskeletal: Positive for arthralgias and back pain.  Neurological: Negative for numbness.    Physical Exam Updated Vital Signs BP (!) 143/92 (BP Location: Right Arm)   Pulse 64   Temp 97.9 F (36.6 C) (Oral)   Resp 16   Ht 5' 9"  (1.753 m)   Wt 72.5 kg   SpO2 98%   BMI 23.60 kg/m   Physical Exam Vitals and nursing note reviewed.  Constitutional:      General: He is not in acute distress.    Appearance: He is well-developed.     Comments: Resting in the bed in no acute distress  HENT:     Head: Normocephalic and atraumatic.  Neck:     Comments: No neck tenderness.  Full active range of motion of the head without pain Cardiovascular:     Rate and  Rhythm: Normal rate and regular rhythm.     Pulses: Normal pulses.  Pulmonary:     Effort: Pulmonary effort is normal.     Breath sounds: Normal breath sounds.  Abdominal:     General: There is no distension.     Palpations: Abdomen is soft. There is no mass.     Tenderness: There is no abdominal tenderness. There is no guarding or rebound.  Musculoskeletal:  General: Tenderness present.     Cervical back: Normal range of motion and neck supple. No rigidity or tenderness.     Comments: Tenderness palpation of the anterior right shoulder.  No erythema, warmth, or swelling.  Mild tenderness palpation of the right upper arm.  No tenderness palpation of the forearm, wrist, or hand.  Radial pulses 2+ bilaterally.  Grip strength equal bilaterally.  Pain with both active and passive range of motion, especially past 90 degrees of abduction.  Tenderness palpation of bilateral low back musculature.  No pain over midline spine.  No step-offs or deformities.  No pain with straight leg raise. No ttp of the upper back or posterior shoulder.  Skin:    General: Skin is warm.     Capillary Refill: Capillary refill takes less than 2 seconds.     Findings: No rash.  Neurological:     Mental Status: He is alert and oriented to person, place, and time.     ED Results / Procedures / Treatments   Labs (all labs ordered are listed, but only abnormal results are displayed) Labs Reviewed  URINALYSIS, ROUTINE W REFLEX MICROSCOPIC - Abnormal; Notable for the following components:      Result Value   Specific Gravity, Urine >1.030 (*)    Glucose, UA >=500 (*)    All other components within normal limits  URINALYSIS, MICROSCOPIC (REFLEX) - Abnormal; Notable for the following components:   Bacteria, UA FEW (*)    All other components within normal limits    EKG None  Radiology DG Shoulder Right  Result Date: 04/11/2020 CLINICAL DATA:  Right shoulder pain. EXAM: RIGHT SHOULDER - 2+ VIEW COMPARISON:   07/31/2019 FINDINGS: Degenerative changes in the [facility] joint with joint space narrowing and spurring. Glenohumeral joint is maintained. No acute bony abnormality. Specifically, no fracture, subluxation, or dislocation. Soft tissues are intact. 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 No acute bony abnormality. Electronically Signed   By: Rolm Baptise M.D.   On: 04/11/2020 11:28    Procedures Procedures (including critical care time)  Medications Ordered in ED Medications  ketorolac (TORADOL) 30 MG/ML injection 15 mg (15 mg Intramuscular Given 04/11/20 1131)    ED Course  I have reviewed the triage vital signs and the nursing notes.  Pertinent labs & imaging results that were available during my care of the patient were reviewed by me and considered in my medical decision making (see chart for details).    MDM Rules/Calculators/A&P                      Patient presenting for evaluation of right shoulder pain and bilateral low back pain.  On exam, patient appears nontoxic.  He is neurovascularly intact.  Shoulder exam is not consistent with infection such as septic joint.  Pain is reproducible with palpation of the musculature, worse with movement.  Likely MSK, consider nerve irritation.  No pain at the neck or with movement of the head, doubt cervical radiculopathy.  Considering patient's age, will obtain x-ray to ensure no bony abnormality such as occult fracture or tumor.  Additionally, patient presenting with bilateral low back pain.  On exam, this is reproducible with palpation of lower back musculature.  No red flags for back pain.  I do not believe he needs emergent x-rays or images today.  Will obtain urine due to urinary frequency.  UA interpreted by me, overall reassuring.  He does have greater than 500 glucose.  He states he has been out of his insulin for 2 weeks, though not taking any of his other medications either, as he ran out last week.  Will refill his medications  including his insulin and encourage close follow-up with his primary care doctor.  Shoulder x-ray viewed interpreted by me, no fracture or dislocation.  No tumor.  Bony changes consistent with arthritis.  Discussed findings with patient.  Discussed likely MSK pain.  Discussed symptomatic treatment with NSAIDs, muscle relaxers.  Encourage follow-up with sports medicine, information given.  At this time, patient appears safe for discharge.  Return precautions given.  Patient states he understands and agrees to plan.  Final Clinical Impression(s) / ED Diagnoses Final diagnoses:  Uncontrolled type 2 diabetes mellitus with hyperglycemia ([place])  Type 2 diabetes mellitus without complication, with long-term current use of insulin ([place])  Essential hypertension  Other hyperlipidemia    Rx / DC Orders ED Discharge Orders         Ordered    insulin glargine (LANTUS SOLOSTAR) 100 UNIT/ML Solostar Pen  Daily at bedtime     04/11/20 1151    lisinopril (ZESTRIL) 20 MG tablet    Note to Pharmacy: **Patient requests 90 days supply**   04/11/20 1151    simvastatin (ZOCOR) 20 MG tablet  Daily-1800     04/11/20 1151    naproxen (NAPROSYN) 375 MG tablet  2 times daily with meals     04/11/20 1151    methocarbamol (ROBAXIN) 500 MG tablet  At bedtime PRN     04/11/20 [place], Kagen Kunath, PA-C 04/11/20 [place], Dan, DO 04/11/20 1340

## 2020-04-11 NOTE — ED Triage Notes (Signed)
Lower back and right shoulder pain for a month.

## 2020-05-03 ENCOUNTER — Other Ambulatory Visit: Payer: Self-pay | Admitting: Family Medicine

## 2020-05-06 ENCOUNTER — Other Ambulatory Visit: Payer: Self-pay

## 2020-05-06 ENCOUNTER — Ambulatory Visit (INDEPENDENT_AMBULATORY_CARE_PROVIDER_SITE_OTHER): Payer: Commercial Managed Care - PPO | Admitting: Family Medicine

## 2020-05-06 ENCOUNTER — Encounter: Payer: Self-pay | Admitting: Family Medicine

## 2020-05-06 VITALS — BP 136/79 | HR 78 | Temp 97.7°F | Resp 16 | Ht 68.0 in | Wt 155.8 lb

## 2020-05-06 DIAGNOSIS — E1165 Type 2 diabetes mellitus with hyperglycemia: Secondary | ICD-10-CM

## 2020-05-06 DIAGNOSIS — E119 Type 2 diabetes mellitus without complications: Secondary | ICD-10-CM

## 2020-05-06 DIAGNOSIS — F419 Anxiety disorder, unspecified: Secondary | ICD-10-CM

## 2020-05-06 DIAGNOSIS — Z794 Long term (current) use of insulin: Secondary | ICD-10-CM | POA: Diagnosis not present

## 2020-05-06 DIAGNOSIS — R350 Frequency of micturition: Secondary | ICD-10-CM

## 2020-05-06 DIAGNOSIS — Z09 Encounter for follow-up examination after completed treatment for conditions other than malignant neoplasm: Secondary | ICD-10-CM

## 2020-05-06 DIAGNOSIS — R739 Hyperglycemia, unspecified: Secondary | ICD-10-CM

## 2020-05-06 DIAGNOSIS — Z9119 Patient's noncompliance with other medical treatment and regimen: Secondary | ICD-10-CM

## 2020-05-06 DIAGNOSIS — M25511 Pain in right shoulder: Secondary | ICD-10-CM

## 2020-05-06 DIAGNOSIS — Z91199 Patient's noncompliance with other medical treatment and regimen due to unspecified reason: Secondary | ICD-10-CM

## 2020-05-06 DIAGNOSIS — I10 Essential (primary) hypertension: Secondary | ICD-10-CM

## 2020-05-06 DIAGNOSIS — R7309 Other abnormal glucose: Secondary | ICD-10-CM

## 2020-05-06 DIAGNOSIS — G629 Polyneuropathy, unspecified: Secondary | ICD-10-CM

## 2020-05-06 DIAGNOSIS — E7849 Other hyperlipidemia: Secondary | ICD-10-CM

## 2020-05-06 LAB — POCT URINALYSIS DIP (CLINITEK)
Bilirubin, UA: NEGATIVE
Blood, UA: NEGATIVE
Glucose, UA: NEGATIVE mg/dL
Ketones, POC UA: NEGATIVE mg/dL
Leukocytes, UA: NEGATIVE
Nitrite, UA: NEGATIVE
POC PROTEIN,UA: NEGATIVE
Spec Grav, UA: >=1.03 — AB (ref 1.010–1.025)
Urobilinogen, UA: 0.2 E.U./dL
pH, UA: 5.5 (ref 5.0–8.0)

## 2020-05-06 LAB — POCT GLYCOSYLATED HEMOGLOBIN (HGB A1C)
HbA1c POC (<> result, manual entry): 11.3 % (ref 4.0–5.6)
HbA1c, POC (controlled diabetic range): 11.3 % — AB (ref 0.0–7.0)
HbA1c, POC (prediabetic range): 11.3 % — AB (ref 5.7–6.4)
Hemoglobin A1C: 11.3 % — AB (ref 4.0–5.6)

## 2020-05-06 LAB — GLUCOSE, POCT (MANUAL RESULT ENTRY): POC Glucose: 213 mg/dl — AB (ref 70–99)

## 2020-05-06 MED ORDER — SIMVASTATIN 20 MG PO TABS: 20.0000 mg | ORAL_TABLET | Freq: Every day | ORAL | 3 refills | Status: DC

## 2020-05-06 MED ORDER — LISINOPRIL 20 MG PO TABS: ORAL_TABLET | ORAL | 3 refills | Status: DC

## 2020-05-06 MED ORDER — BUSPIRONE HCL 10 MG PO TABS: 10.0000 mg | ORAL_TABLET | Freq: Two times a day (BID) | ORAL | 3 refills | Status: DC

## 2020-05-06 MED ORDER — METHOCARBAMOL 500 MG PO TABS: 500.0000 mg | ORAL_TABLET | Freq: Two times a day (BID) | ORAL | 3 refills | Status: DC | PRN

## 2020-05-06 MED ORDER — LANTUS SOLOSTAR 100 UNIT/ML ~~LOC~~ SOPN: 50.0000 [IU] | PEN_INJECTOR | Freq: Every day | SUBCUTANEOUS | 3 refills | Status: DC

## 2020-05-06 MED ORDER — GABAPENTIN 300 MG PO CAPS: ORAL_CAPSULE | ORAL | 3 refills | Status: AC

## 2020-05-06 MED ORDER — METFORMIN HCL 1000 MG PO TABS: 1000.0000 mg | ORAL_TABLET | Freq: Two times a day (BID) | ORAL | 3 refills | Status: DC

## 2020-05-06 NOTE — Progress Notes (Signed)
Patient [facility] and [facility] Follow Up   Subjective:  Patient ID: Dean Robertson, male    DOB: [birth year]  Age: 59 y.o. MRN: [id number]  CC:  Chief Complaint  Patient presents with  . Medication Refill    Pt states he is here for medicine refill and back and RSHOULDER PAIN. pT STATES IT'S BEEN GOING ON FOR 8MONTHS PT STATES THE PAIN TRAVELS FROM THE LOWER PART OF BACK TO HIS THIGHS AND HIPS.    HPI Dean Robertson is a 59 year old man who presents for Follow Up today.   Past Medical History:  Diagnosis Date  . Anxiety   . Arthritis    shoulders  . Coronary artery disease   . Diabetes mellitus without complication ([place])   . H/O heart artery stent    x1  . High cholesterol   . Hypertension     Current Status: Since his last office visit, he is doing well with no complaints. He has not been monitor his blood glucose levels regularly. He denies fatigue, frequent urination, blurred vision, excessive hunger, excessive thirst, weight gain, weight loss, and poor wound healing. He continues to check his feet regularly. He denies visual changes, chest pain, cough, shortness of breath, heart palpitations, and falls. He has occasional headaches and dizziness with position changes. Denies severe headaches, confusion, seizures, double vision, and blurred vision, nausea and vomiting. He denies fevers, chills, fatigue, recent infections, weight loss, and night sweats. He has not had any headaches, visual changes, dizziness, and falls. No chest pain, heart palpitations, cough and shortness of breath reported. Denies GI problems such as diarrhea, and constipation. He has no reports of blood in stools, dysuria and hematuria. No depression or anxiety reported today. He denies suicidal ideations, homicidal ideations, or auditory hallucinations. He is taking all medications as prescribed. He denies pain today.    Past Surgical History:  Procedure Laterality Date   . COLONOSCOPY     2012- normal   . CORONARY ANGIOPLASTY WITH STENT PLACEMENT      Family History  Problem Relation Age of Onset  . Hypertension Mother   . Diabetes Father   . Colon polyps Neg Hx   . Colon cancer Neg Hx   . Esophageal cancer Neg Hx   . Rectal cancer Neg Hx   . Stomach cancer Neg Hx     Social History   Socioeconomic History  . Marital status: Legally Separated    Spouse name: Not on file  . Number of children: Not on file  . Years of education: Not on file  . Highest education level: Not on file  Occupational History  . Not on file  Tobacco Use  . Smoking status: Former Research scientist (life sciences)  . Smokeless tobacco: Never Used  . Tobacco comment: as teenager  Substance and Sexual Activity  . Alcohol use: Yes    Comment: occ glass of wine   . Drug use: Not Currently    Types: Marijuana  . Sexual activity: Yes  Other Topics Concern  . Not on file  Social History Narrative  . Not on file   Social Determinants of Health   Financial Resource Strain:   . Difficulty of Paying Living Expenses:   Food Insecurity:   . Worried About Charity fundraiser in the Last Year:   . Arboriculturist in the Last Year:   Transportation Needs:   . Film/video editor (Medical):   Marland Kitchen  Lack of Transportation (Non-Medical):   Physical Activity:   . Days of Exercise per Week:   . Minutes of Exercise per Session:   Stress:   . Feeling of Stress :   Social Connections:   . Frequency of Communication with Friends and Family:   . Frequency of Social Gatherings with Friends and Family:   . Attends Religious Services:   . Active Member of Clubs or Organizations:   . Attends Archivist Meetings:   Marland Kitchen Marital Status:   Intimate Partner Violence:   . Fear of Current or Ex-Partner:   . Emotionally Abused:   Marland Kitchen Physically Abused:   . Sexually Abused:     Outpatient Medications Prior to Visit  Medication Sig Dispense Refill  . aspirin EC 81 MG tablet Take 81 mg by mouth  daily.    Marland Kitchen gabapentin (NEURONTIN) 300 MG capsule TAKE 1 CAPSULE(300 MG) BY MOUTH THREE TIMES DAILY 90 capsule 2  . glucose blood (TRUE METRIX BLOOD GLUCOSE TEST) test strip Use as instructed 100 each 12  . insulin glargine (LANTUS SOLOSTAR) 100 UNIT/ML Solostar Pen Inject 50 Units into the skin at bedtime. 15 mL 0  . Lancets MISC 1 each 3 (three) times daily by Does not apply route. 100 each 11  . lisinopril (ZESTRIL) 20 MG tablet TAKE 1 TABLET(20 MG) BY MOUTH DAILY 30 tablet 0  . Menthol, Topical Analgesic, (BIOFREEZE) 4 % GEL Apply 1 application topically 4 (four) times daily as needed. 1 Tube 2  . metFORMIN (GLUCOPHAGE) 1000 MG tablet Take 1 tablet (1,000 mg total) by mouth 2 (two) times daily with a meal. 60 tablet 5  . Multiple Vitamins-Minerals (VITRUM SENIOR) TABS Take by mouth.    . naproxen (NAPROSYN) 375 MG tablet Take 1 tablet (375 mg total) by mouth 2 (two) times daily with a meal. 20 tablet 0  . propranolol (INDERAL) 10 MG tablet TAKE 4 TABLETS(40 MG) BY MOUTH EVERY 12 HOURS AS NEEDED FOR ANXIETY 60 tablet 2  . acetaminophen (TYLENOL) 500 MG tablet Take 1 tablet (500 mg total) by mouth every 6 (six) hours as needed. (Patient not taking: Reported on 09/22/2019) 30 tablet 0  . benzonatate (TESSALON) 100 MG capsule Take 1 capsule (100 mg total) by mouth every 8 (eight) hours. 21 capsule 0  . Blood Glucose Monitoring Suppl (TRUE METRIX AIR GLUCOSE METER) w/Device KIT 1 each 3 (three) times daily by Does not apply route. (Patient not taking: Reported on 05/06/2020) 1 kit 0  . methocarbamol (ROBAXIN) 500 MG tablet Take 1 tablet (500 mg total) by mouth at bedtime as needed for muscle spasms. (Patient not taking: Reported on 05/06/2020) 14 tablet 0  . simvastatin (ZOCOR) 20 MG tablet Take 1 tablet (20 mg total) by mouth daily at 6 PM. (Patient not taking: Reported on 05/06/2020) 30 tablet 0  . traMADol (ULTRAM) 50 MG tablet TAKE 1 TABLET(50 MG) BY MOUTH EVERY 8 HOURS FOR UP TO 5 DAYS AS NEEDED  (Patient not taking: Reported on 05/06/2020) 10 tablet 0   Facility-Administered Medications Prior to Visit  Medication Dose Route Frequency Provider Last Rate Last Admin  . 0.9 %  sodium chloride infusion  500 mL Intravenous Once Cirigliano, Vito V, DO        No Known Allergies  ROS Review of Systems  Constitutional: Negative.   HENT: Negative.   Eyes: Negative.   Respiratory: Negative.   Cardiovascular: Negative.   Gastrointestinal: Negative.   Endocrine: Negative.   Genitourinary:  Negative.   Musculoskeletal: Positive for arthralgias (generalized joint pain).  Allergic/Immunologic: Negative.   Neurological: Positive for dizziness (occasional ) and headaches (occasioal ).  Hematological: Negative.   Psychiatric/Behavioral: Negative.       Objective:    Physical Exam Vitals and nursing note reviewed.  Constitutional:      Appearance: Normal appearance.  HENT:     Head: Normocephalic and atraumatic.     Mouth/Throat:     Mouth: Mucous membranes are moist.     Pharynx: Oropharynx is clear.  Cardiovascular:     Rate and Rhythm: Normal rate and regular rhythm.     Pulses: Normal pulses.     Heart sounds: Normal heart sounds.  Pulmonary:     Effort: Pulmonary effort is normal.     Breath sounds: Normal breath sounds.  Abdominal:     General: Bowel sounds are normal.     Palpations: Abdomen is soft.  Musculoskeletal:        General: Normal range of motion.     Cervical back: Normal range of motion and neck supple.  Skin:    General: Skin is warm and dry.  Neurological:     General: No focal deficit present.     Mental Status: He is alert and oriented to person, place, and time.  Psychiatric:        Mood and Affect: Mood normal.        Behavior: Behavior normal.        Thought Content: Thought content normal.        Judgment: Judgment normal.     BP 136/79 (BP Location: Right Arm, Patient Position: Sitting, Cuff Size: Normal)   Pulse 78   Temp 97.7 F (36.5  C)   Resp 16   Ht _0  (1.727 m)   Wt 155 lb 12.8 oz (70.7 kg)   SpO2 99%   BMI 23.69 kg/m  Wt Readings from Last 3 Encounters:  05/06/20 155 lb 12.8 oz (70.7 kg)  04/11/20 159 lb 12.8 oz (72.5 kg)  09/17/19 171 lb 15.3 oz (78 kg)     Health Maintenance Due  Topic Date Due  . OPHTHALMOLOGY EXAM  Never done  . COVID-19 Vaccine (1) Never done  . FOOT EXAM  09/19/2018    There are no preventive care reminders to display for this patient.  Lab Results  Component Value Date   TSH 0.762 01/11/2018   Lab Results  Component Value Date   WBC 4.3 01/11/2018   HGB 14.1 01/11/2018   HCT 41.4 01/11/2018   MCV 85.7 01/11/2018   PLT 245 01/11/2018   Lab Results  Component Value Date   NA 137 11/06/2018   K 4.4 11/06/2018   CO2 24 11/06/2018   GLUCOSE 168 (H) 11/06/2018   BUN 17 11/06/2018   CREATININE 1.13 11/06/2018   BILITOT 0.4 03/05/2018   ALKPHOS 59 03/05/2018   AST 20 03/05/2018   ALT 25 03/05/2018   PROT 6.9 03/05/2018   ALBUMIN 4.5 03/05/2018   CALCIUM 9.4 11/06/2018   ANIONGAP 8 01/11/2018   Lab Results  Component Value Date   CHOL 237 (H) 03/05/2018   Lab Results  Component Value Date   HDL 58 03/05/2018   Lab Results  Component Value Date   LDLCALC 147 (H) 03/05/2018   Lab Results  Component Value Date   TRIG 161 (H) 03/05/2018   Lab Results  Component Value Date   CHOLHDL 4.1 03/05/2018   Lab Results  Component Value Date   HGBA1C 11.3 (A) 05/06/2020   HGBA1C 11.3 05/06/2020   HGBA1C 11.3 (A) 05/06/2020   HGBA1C 11.3 (A) 05/06/2020    Assessment & Plan:   1. Hospital discharge follow-up  2. Non compliance with medical treatment  3. Uncontrolled type 2 diabetes mellitus with hyperglycemia (HCC) - insulin glargine (LANTUS SOLOSTAR) 100 UNIT/ML Solostar Pen; Inject 50 Units into the skin at bedtime.  Dispense: 15 mL; Refill: 3  4. Type 2 diabetes mellitus without complication, with long-term current use of insulin ([place]) He will  continue medication as prescribed, to decrease foods/beverages high in sugars and carbs and follow Heart Healthy or DASH diet. Increase physical activity to at least 30 minutes cardio exercise daily.  - POCT glucose (manual entry) - POCT URINALYSIS DIP (CLINITEK) - POC HgB A1c - insulin glargine (LANTUS SOLOSTAR) 100 UNIT/ML Solostar Pen; Inject 50 Units into the skin at bedtime.  Dispense: 15 mL; Refill: 3 - metFORMIN (GLUCOPHAGE) 1000 MG tablet; Take 1 tablet (1,000 mg total) by mouth 2 (two) times daily with a meal.  Dispense: 180 tablet; Refill: 3 - CBC with Differential - Comprehensive metabolic panel; Future - Lipid Panel - TSH - Vitamin B12 - Vitamin D, 25-hydroxy  5. Hemoglobin A1C greater than 9%, indicating poor diabetic control Worsened. Hgb A1c increased at 11.3 today, from 9.8. monitor.   6. Hyperglycemia  7. Neuropathy - gabapentin (NEURONTIN) 300 MG capsule; TAKE 1 CAPSULE(300 MG) BY MOUTH THREE TIMES DAILY  Dispense: 270 capsule; Refill: 3  8. Essential hypertension The current medical regimen is effective; blood pressure is stable at 136/79 today; continue present plan and medications as prescribed. He will continue to take medications as prescribed, to decrease high sodium intake, excessive alcohol intake, increase potassium intake, smoking cessation, and increase physical activity of at least 30 minutes of cardio activity daily. He will continue to follow Heart Healthy or DASH diet. - lisinopril (ZESTRIL) 20 MG tablet; TAKE 1 TABLET(20 MG) BY MOUTH DAILY  Dispense: 90 tablet; Refill: 3  9. Anxiety - busPIRone (BUSPAR) 10 MG tablet; Take 1 tablet (10 mg total) by mouth 2 (two) times daily.  Dispense: 60 tablet; Refill: 3  10. Other hyperlipidemia - simvastatin (ZOCOR) 20 MG tablet; Take 1 tablet (20 mg total) by mouth daily at 6 PM.  Dispense: 90 tablet; Refill: 3  11. Right shoulder pain, unspecified chronicity - methocarbamol (ROBAXIN) 500 MG tablet; Take 1 tablet  (500 mg total) by mouth 2 (two) times daily as needed for muscle spasms.  Dispense: 60 tablet; Refill: 3  12. Urinary frequency - PSA  13. Follow up He will follow up in 3 months.   Meds ordered this encounter  Medications  . gabapentin (NEURONTIN) 300 MG capsule    Sig: TAKE 1 CAPSULE(300 MG) BY MOUTH THREE TIMES DAILY    Dispense:  270 capsule    Refill:  3  . insulin glargine (LANTUS SOLOSTAR) 100 UNIT/ML Solostar Pen    Sig: Inject 50 Units into the skin at bedtime.    Dispense:  15 mL    Refill:  3  . lisinopril (ZESTRIL) 20 MG tablet    Sig: TAKE 1 TABLET(20 MG) BY MOUTH DAILY    Dispense:  90 tablet    Refill:  3  . metFORMIN (GLUCOPHAGE) 1000 MG tablet    Sig: Take 1 tablet (1,000 mg total) by mouth 2 (two) times daily with a meal.    Dispense:  180 tablet  Refill:  3  . simvastatin (ZOCOR) 20 MG tablet    Sig: Take 1 tablet (20 mg total) by mouth daily at 6 PM.    Dispense:  90 tablet    Refill:  3  . methocarbamol (ROBAXIN) 500 MG tablet    Sig: Take 1 tablet (500 mg total) by mouth 2 (two) times daily as needed for muscle spasms.    Dispense:  60 tablet    Refill:  3  . busPIRone (BUSPAR) 10 MG tablet    Sig: Take 1 tablet (10 mg total) by mouth 2 (two) times daily.    Dispense:  60 tablet    Refill:  3    Orders Placed This Encounter  Procedures  . CBC with Differential  . Comprehensive metabolic panel  . Lipid Panel  . TSH  . Vitamin B12  . Vitamin D, 25-hydroxy  . PSA  . POCT glucose (manual entry)  . POCT URINALYSIS DIP (CLINITEK)  . POC HgB A1c    Referral Orders  No referral(s) requested today   Kathe Becton,  MSN, FNP-BC [address], [place] [address] [phone] fax   Problem List Items Addressed This Visit      Cardiovascular and Mediastinum   Essential hypertension     Endocrine   Type 2 diabetes  mellitus without complication, with long-term current use of insulin ([place])   Relevant Orders   POCT glucose (manual entry) (Completed)   POCT URINALYSIS DIP (CLINITEK)   POC HgB A1c (Completed)     Nervous and Auditory   Neuropathy     Other   Anxiety    Other Visit Diagnoses    Hospital discharge follow-up    -  Primary   Non compliance with medical treatment       Uncontrolled type 2 diabetes mellitus with hyperglycemia (HCC)       Hemoglobin A1C greater than 9%, indicating poor diabetic control       Hyperglycemia       Follow up          No orders of the defined types were placed in this encounter.   Follow-up: No follow-ups on file.    Azzie Glatter, FNP

## 2020-05-07 LAB — CBC WITH DIFFERENTIAL/PLATELET
Basophils Absolute: 0 10*3/uL (ref 0.0–0.2)
Basos: 1 %
EOS (ABSOLUTE): 0.1 10*3/uL (ref 0.0–0.4)
Eos: 3 %
Hematocrit: 42.1 % (ref 37.5–51.0)
Hemoglobin: 14.9 g/dL (ref 13.0–17.7)
Immature Grans (Abs): 0 10*3/uL (ref 0.0–0.1)
Immature Granulocytes: 0 %
Lymphocytes Absolute: 1.4 10*3/uL (ref 0.7–3.1)
Lymphs: 36 %
MCH: 30.6 pg (ref 26.6–33.0)
MCHC: 35.4 g/dL (ref 31.5–35.7)
MCV: 86 fL (ref 79–97)
Monocytes Absolute: 0.3 10*3/uL (ref 0.1–0.9)
Monocytes: 9 %
Neutrophils Absolute: 2.1 10*3/uL (ref 1.4–7.0)
Neutrophils: 51 %
Platelets: 283 10*3/uL (ref 150–450)
RBC: 4.87 x10E6/uL (ref 4.14–5.80)
RDW: 12 % (ref 11.6–15.4)
WBC: 4 10*3/uL (ref 3.4–10.8)

## 2020-05-07 LAB — LIPID PANEL
Chol/HDL Ratio: 2.7 ratio (ref 0.0–5.0)
Cholesterol, Total: 220 mg/dL — ABNORMAL HIGH (ref 100–199)
HDL: 82 mg/dL (ref >39)
LDL Chol Calc (NIH): 123 mg/dL — ABNORMAL HIGH (ref 0–99)
Triglycerides: 86 mg/dL (ref 0–149)
VLDL Cholesterol Cal: 15 mg/dL (ref 5–40)

## 2020-05-07 LAB — VITAMIN B12: Vitamin B-12: 1736 pg/mL — ABNORMAL HIGH (ref 232–1245)

## 2020-05-07 LAB — PSA: Prostate Specific Ag, Serum: 1 ng/mL (ref 0.0–4.0)

## 2020-05-07 LAB — VITAMIN D 25 HYDROXY (VIT D DEFICIENCY, FRACTURES): Vit D, 25-Hydroxy: 20.3 ng/mL — ABNORMAL LOW (ref 30.0–100.0)

## 2020-05-07 LAB — TSH: TSH: 0.605 u[IU]/mL (ref 0.450–4.500)

## 2020-05-09 ENCOUNTER — Encounter (HOSPITAL_BASED_OUTPATIENT_CLINIC_OR_DEPARTMENT_OTHER): Payer: Self-pay

## 2020-05-09 ENCOUNTER — Other Ambulatory Visit: Payer: Self-pay

## 2020-05-09 ENCOUNTER — Emergency Department (HOSPITAL_BASED_OUTPATIENT_CLINIC_OR_DEPARTMENT_OTHER)
Admission: EM | Admit: 2020-05-09 | Discharge: 2020-05-10 | Disposition: A | Discharge status: Home / Self Care | Payer: Commercial Managed Care - PPO | Attending: Emergency Medicine | Admitting: Emergency Medicine

## 2020-05-09 DIAGNOSIS — Y929 Unspecified place or not applicable: Secondary | ICD-10-CM | POA: Diagnosis not present

## 2020-05-09 DIAGNOSIS — Z955 Presence of coronary angioplasty implant and graft: Secondary | ICD-10-CM | POA: Diagnosis not present

## 2020-05-09 DIAGNOSIS — Z87891 Personal history of nicotine dependence: Secondary | ICD-10-CM | POA: Diagnosis not present

## 2020-05-09 DIAGNOSIS — I2510 Atherosclerotic heart disease of native coronary artery without angina pectoris: Secondary | ICD-10-CM | POA: Diagnosis not present

## 2020-05-09 DIAGNOSIS — E119 Type 2 diabetes mellitus without complications: Secondary | ICD-10-CM | POA: Insufficient documentation

## 2020-05-09 DIAGNOSIS — Y939 Activity, unspecified: Secondary | ICD-10-CM | POA: Insufficient documentation

## 2020-05-09 DIAGNOSIS — Y999 Unspecified external cause status: Secondary | ICD-10-CM | POA: Insufficient documentation

## 2020-05-09 DIAGNOSIS — I10 Essential (primary) hypertension: Secondary | ICD-10-CM | POA: Diagnosis not present

## 2020-05-09 DIAGNOSIS — S1096XA Insect bite of unspecified part of neck, initial encounter: Secondary | ICD-10-CM | POA: Diagnosis not present

## 2020-05-09 DIAGNOSIS — W57XXXA Bitten or stung by nonvenomous insect and other nonvenomous arthropods, initial encounter: Secondary | ICD-10-CM | POA: Diagnosis not present

## 2020-05-09 NOTE — ED Triage Notes (Signed)
Pt requesting tick removal to left posterior shoulder-NAD-steady gait

## 2020-05-09 NOTE — ED Notes (Signed)
Tick removed  With forceps from upper left back , tick intact, area cleaned with alcohol

## 2020-05-10 MED ORDER — DOXYCYCLINE HYCLATE 100 MG PO CAPS
100.0000 mg | ORAL_CAPSULE | Freq: Two times a day (BID) | ORAL | 0 refills | Status: AC
Start: 2020-05-10 — End: 2020-05-20

## 2020-05-10 NOTE — Discharge Instructions (Addendum)
You were evaluated in the Emergency Department and after careful evaluation, we did not find any emergent condition requiring admission or further testing in the hospital.  Your exam/testing today was overall reassuring.  Please take the doxycycline antibiotic to cover for any tickborne illness such as Lyme disease.  Please return to the Emergency Department if you experience any worsening of your condition.  We encourage you to follow up with a primary care provider.  Thank you for allowing [place] to be a part of your care.

## 2020-05-10 NOTE — ED Provider Notes (Signed)
[facility] Emergency Department Provider Note MRN:  [id number]  Arrival date & time: 05/10/20     Chief Complaint   Tick Removal   History of Present Illness   Dean Robertson is a 59 y.o. year-old male with a history of diabetes, CAD presenting to the ED with chief complaint of tach..  Patient noticed a tick on his left neck/shoulder today.  Possibly has been there for several days, went fishing recently.  Has been having some aches and pains to his back joints.  Denies fever, no other complaints.  Symptoms mild, constant, worse with motion or palpation  Review of Systems  A complete 10 system review of systems was obtained and all systems are negative except as noted in the HPI and PMH.   Patient's Health History    Past Medical History:  Diagnosis Date  . Anxiety   . Arthritis    shoulders  . Coronary artery disease   . Diabetes mellitus without complication ([place])   . H/O heart artery stent    x1  . High cholesterol   . Hypertension     Past Surgical History:  Procedure Laterality Date  . COLONOSCOPY     2012- normal   . CORONARY ANGIOPLASTY WITH STENT PLACEMENT      Family History  Problem Relation Age of Onset  . Hypertension Mother   . Diabetes Father   . Colon polyps Neg Hx   . Colon cancer Neg Hx   . Esophageal cancer Neg Hx   . Rectal cancer Neg Hx   . Stomach cancer Neg Hx     Social History   Socioeconomic History  . Marital status: Legally Separated    Spouse name: Not on file  . Number of children: Not on file  . Years of education: Not on file  . Highest education level: Not on file  Occupational History  . Not on file  Tobacco Use  . Smoking status: Former Research scientist (life sciences)  . Smokeless tobacco: Never Used  Substance and Sexual Activity  . Alcohol use: Yes    Comment: occ  . Drug use: Not Currently  . Sexual activity: Not on file  Other Topics Concern  . Not on file  Social History Narrative  . Not on file    Social Determinants of Health   Financial Resource Strain:   . Difficulty of Paying Living Expenses:   Food Insecurity:   . Worried About Charity fundraiser in the Last Year:   . Arboriculturist in the Last Year:   Transportation Needs:   . Film/video editor (Medical):   Marland Kitchen Lack of Transportation (Non-Medical):   Physical Activity:   . Days of Exercise per Week:   . Minutes of Exercise per Session:   Stress:   . Feeling of Stress :   Social Connections:   . Frequency of Communication with Friends and Family:   . Frequency of Social Gatherings with Friends and Family:   . Attends Religious Services:   . Active Member of Clubs or Organizations:   . Attends Archivist Meetings:   Marland Kitchen Marital Status:   Intimate Partner Violence:   . Fear of Current or Ex-Partner:   . Emotionally Abused:   Marland Kitchen Physically Abused:   . Sexually Abused:      Physical Exam   Vitals:   05/09/20 2239  BP: 126/86  Pulse: 82  Resp: 18  Temp: 98.1 F (36.7 C)  SpO2: 99%    CONSTITUTIONAL: Well-appearing, NAD NEURO:  Alert and oriented x 3, no focal deficits EYES:  eyes equal and reactive ENT/NECK:  no LAD, no JVD CARDIO: Regular rate, well-perfused, normal S1 and S2 PULM:  CTAB no wheezing or rhonchi GI/GU:  normal bowel sounds, non-distended, non-tender MSK/SPINE:  No gross deformities, no edema SKIN: 1 cm circular area of induration with central small wound status post tick removal PSYCH:  Appropriate speech and behavior  *Additional and/or pertinent findings included in MDM below  Diagnostic and Interventional Summary    EKG Interpretation  Date/Time:    Ventricular Rate:    PR Interval:    QRS Duration:   QT Interval:    QTC Calculation:   R Axis:     Text Interpretation:        Labs Reviewed - No data to display  No orders to display    Medications - No data to display   Procedures  /  Critical Care Procedures  ED Course and Medical Decision Making  I  have reviewed the triage vital signs, the nursing notes, and pertinent available records from the EMR.  Listed above are laboratory and imaging tests that I personally ordered, reviewed, and interpreted and then considered in my medical decision making (see below for details).      Tick successfully removed by nursing staff, clean removal without any debris remaining, no signs of erythema migrans, normal vital signs, no fever, patient has been having some generalized aches and pains, will cover with doxycycline to treat for tickborne illness.    Barth Kirks. [place]onia Small, [place] [email or web address]  Final Clinical Impressions(s) / ED Diagnoses     ICD-10-CM   1. Tick bite, initial encounter  W57.[email or web address] [email or web address]     ED Discharge Orders         Ordered    doxycycline (VIBRAMYCIN) 100 MG capsule  2 times daily     Discontinue  Reprint     05/10/20 0036           Discharge Instructions Discussed with and Provided to Patient:     Discharge Instructions     You were evaluated in the Emergency Department and after careful evaluation, we did not find any emergent condition requiring admission or further testing in the hospital.  Your exam/testing today was overall reassuring.  Please take the doxycycline antibiotic to cover for any tickborne illness such as Lyme disease.  Please return to the Emergency Department if you experience any worsening of your condition.  We encourage you to follow up with a primary care provider.  Thank you for allowing [place] to be a part of your care.       Maudie Flakes, MD 05/10/20 [phone]

## 2020-05-10 NOTE — ED Notes (Signed)
ED Provider at bedside. 

## 2020-05-17 ENCOUNTER — Other Ambulatory Visit: Payer: Self-pay

## 2020-05-17 ENCOUNTER — Ambulatory Visit (INDEPENDENT_AMBULATORY_CARE_PROVIDER_SITE_OTHER): Payer: Commercial Managed Care - PPO | Admitting: Family Medicine

## 2020-05-17 DIAGNOSIS — W57XXXA Bitten or stung by nonvenomous insect and other nonvenomous arthropods, initial encounter: Secondary | ICD-10-CM | POA: Diagnosis not present

## 2020-05-17 DIAGNOSIS — Z09 Encounter for follow-up examination after completed treatment for conditions other than malignant neoplasm: Secondary | ICD-10-CM | POA: Diagnosis not present

## 2020-05-17 MED ORDER — PROPRANOLOL HCL 40 MG PO TABS: 40.0000 mg | ORAL_TABLET | Freq: Two times a day (BID) | ORAL | 6 refills | Status: DC

## 2020-05-17 MED ORDER — BIOFREEZE 4 % EX GEL: 1.0000 "application " | Freq: Four times a day (QID) | CUTANEOUS | 3 refills | Status: AC | PRN

## 2020-05-17 NOTE — Progress Notes (Signed)
Virtual Visit via Telephone Note  I connected with Dean Robertson on 05/17/20 at  3:20 PM EDT by telephone and verified that I am speaking with the correct person using two identifiers.   I discussed the limitations, risks, security and privacy concerns of performing an evaluation and management service by telephone and the availability of in person appointments. I also discussed with the patient that there may be a patient responsible charge related to this service. The patient expressed understanding and agreed to proceed.   Televisit Today Patient Location: Home Provider Location: Office    History of Present Illness:  Past Surgical History:  Procedure Laterality Date  . COLONOSCOPY     2012- normal   . CORONARY ANGIOPLASTY WITH STENT PLACEMENT     Social History   Socioeconomic History  . Marital status: Legally Separated    Spouse name: Not on file  . Number of children: Not on file  . Years of education: Not on file  . Highest education level: Not on file  Occupational History  . Not on file  Tobacco Use  . Smoking status: Former Research scientist (life sciences)  . Smokeless tobacco: Never Used  Substance and Sexual Activity  . Alcohol use: Yes    Comment: occ  . Drug use: Not Currently  . Sexual activity: Not on file  Other Topics Concern  . Not on file  Social History Narrative  . Not on file   Social Determinants of Health   Financial Resource Strain:   . Difficulty of Paying Living Expenses:   Food Insecurity:   . Worried About Charity fundraiser in the Last Year:   . Arboriculturist in the Last Year:   Transportation Needs:   . Film/video editor (Medical):   Marland Kitchen Lack of Transportation (Non-Medical):   Physical Activity:   . Days of Exercise per Week:   . Minutes of Exercise per Session:   Stress:   . Feeling of Stress :   Social Connections:   . Frequency of Communication with Friends and Family:   . Frequency of Social Gatherings with Friends and Family:   .  Attends Religious Services:   . Active Member of Clubs or Organizations:   . Attends Archivist Meetings:   Marland Kitchen Marital Status:   Intimate Partner Violence:   . Fear of Current or Ex-Partner:   . Emotionally Abused:   Marland Kitchen Physically Abused:   . Sexually Abused:     Family History  Problem Relation Age of Onset  . Hypertension Mother   . Diabetes Father   . Colon polyps Neg Hx   . Colon cancer Neg Hx   . Esophageal cancer Neg Hx   . Rectal cancer Neg Hx   . Stomach cancer Neg Hx    No Known Allergies   Patient Active Problem List   Diagnosis Date Noted  . Anxiety 02/05/2019  . Essential hypertension 12/08/2017  . Type 2 diabetes mellitus without complication, with long-term current use of insulin ([place]) 12/08/2017  . Chest pain 10/04/2017  . ASHD (arteriosclerotic heart disease) 11/15/2015  . Hyperlipidemia 11/15/2015  . Neuropathy 11/15/2015  . Sprain/strain 11/27/2013    Current Outpatient Medications on File Prior to Visit  Medication Sig Dispense Refill  . acetaminophen (TYLENOL) 500 MG tablet Take 1 tablet (500 mg total) by mouth every 6 (six) hours as needed. 30 tablet 0  . aspirin EC 81 MG tablet Take 81 mg by mouth daily.    Marland Kitchen  benzonatate (TESSALON) 100 MG capsule Take 1 capsule (100 mg total) by mouth every 8 (eight) hours. 21 capsule 0  . Blood Glucose Monitoring Suppl (TRUE METRIX AIR GLUCOSE METER) w/Device KIT 1 each 3 (three) times daily by Does not apply route. 1 kit 0  . busPIRone (BUSPAR) 10 MG tablet Take 1 tablet (10 mg total) by mouth 2 (two) times daily. 60 tablet 3  . doxycycline (VIBRAMYCIN) 100 MG capsule Take 1 capsule (100 mg total) by mouth 2 (two) times daily for 10 days. 20 capsule 0  . gabapentin (NEURONTIN) 300 MG capsule TAKE 1 CAPSULE(300 MG) BY MOUTH THREE TIMES DAILY 270 capsule 3  . glucose blood (TRUE METRIX BLOOD GLUCOSE TEST) test strip Use as instructed 100 each 12  . insulin glargine (LANTUS SOLOSTAR) 100 UNIT/ML Solostar Pen  Inject 50 Units into the skin at bedtime. 15 mL 3  . Lancets MISC 1 each 3 (three) times daily by Does not apply route. 100 each 11  . lisinopril (ZESTRIL) 20 MG tablet TAKE 1 TABLET(20 MG) BY MOUTH DAILY 90 tablet 3  . metFORMIN (GLUCOPHAGE) 1000 MG tablet Take 1 tablet (1,000 mg total) by mouth 2 (two) times daily with a meal. 180 tablet 3  . Multiple Vitamins-Minerals (VITRUM SENIOR) TABS Take by mouth.    . naproxen (NAPROSYN) 375 MG tablet Take 1 tablet (375 mg total) by mouth 2 (two) times daily with a meal. 20 tablet 0  . simvastatin (ZOCOR) 20 MG tablet Take 1 tablet (20 mg total) by mouth daily at 6 PM. 90 tablet 3  . traMADol (ULTRAM) 50 MG tablet TAKE 1 TABLET(50 MG) BY MOUTH EVERY 8 HOURS FOR UP TO 5 DAYS AS NEEDED 10 tablet 0  . methocarbamol (ROBAXIN) 500 MG tablet Take 1 tablet (500 mg total) by mouth 2 (two) times daily as needed for muscle spasms. 60 tablet 3   Current Facility-Administered Medications on File Prior to Visit  Medication Dose Route Frequency Provider Last Rate Last Admin  . 0.9 %  sodium chloride infusion  500 mL Intravenous Once Cirigliano, Vito V, DO        Current Status: This will be Mr. Biggins initial office visit with me. He was previously seeing Lanae Boast, NP for his PCP needs. Since his last office visit, He has has several ED visits, last visit on 05/09/2020 for a Tick Bite, which he had tick removed, and placed on Doxycycline. is doing well with no complaints. He denies fevers, chills, fatigue, recent infections, weight loss, and night sweats. He has not had any headaches, visual changes, dizziness, and falls. No chest pain, heart palpitations, cough and shortness of breath reported. No reports of GI problems such as nausea, vomiting, diarrhea, and constipation. He has no reports of blood in stools, dysuria and hematuria. No depression or anxiety, and denies suicidal ideations, homicidal ideations, or auditory hallucinations. He is taking all  medications as prescribed. He denies pain today.    Observations/Objective: Telephone Visit   Assessment and Plan:  1. Hospital discharge follow-up  2. Tick bite, initial encounter Stable. Continue to take Doxycycline until complete. He will report to office if not effective.   3. Follow up He will follow up in 3 months.   Meds ordered this encounter  Medications  . Menthol, Topical Analgesic, (BIOFREEZE) 4 % GEL    Sig: Apply 1 application topically 4 (four) times daily as needed.    Dispense:  89 mL    Refill:  3  .  propranolol (INDERAL) 40 MG tablet    Sig: Take 1 tablet (40 mg total) by mouth 2 (two) times daily. As needed for anxiety    Dispense:  60 tablet    Refill:  6    No orders of the defined types were placed in this encounter.   Referral Orders  No referral(s) requested today    Kathe Becton,  MSN, FNP-BC [facility] [address] [place], [place] [address] [phone] fax      I discussed the assessment and treatment plan with the patient. The patient was provided an opportunity to ask questions and all were answered. The patient agreed with the plan and demonstrated an understanding of the instructions.   The patient was advised to call back or seek an in-person evaluation if the symptoms worsen or if the condition fails to improve as anticipated.  I provided 20 minutes of non-face-to-face time during this encounter.   Azzie Glatter, FNP

## 2020-05-17 NOTE — Progress Notes (Signed)
Pt was called @ 3:15 for his telephone visit. Pt didn't answer the phone so I left a message for hm to give me a call back.

## 2020-06-22 ENCOUNTER — Encounter: Payer: Self-pay | Admitting: Nurse Practitioner

## 2020-06-22 ENCOUNTER — Ambulatory Visit (INDEPENDENT_AMBULATORY_CARE_PROVIDER_SITE_OTHER): Payer: Commercial Managed Care - PPO | Admitting: Nurse Practitioner

## 2020-06-22 ENCOUNTER — Other Ambulatory Visit: Payer: Self-pay

## 2020-06-22 VITALS — BP 154/89 | HR 84 | Temp 97.3°F | Ht 68.0 in | Wt 158.0 lb

## 2020-06-22 DIAGNOSIS — M5442 Lumbago with sciatica, left side: Secondary | ICD-10-CM

## 2020-06-22 DIAGNOSIS — M5441 Lumbago with sciatica, right side: Secondary | ICD-10-CM

## 2020-06-22 DIAGNOSIS — M25511 Pain in right shoulder: Secondary | ICD-10-CM

## 2020-06-22 DIAGNOSIS — E1165 Type 2 diabetes mellitus with hyperglycemia: Secondary | ICD-10-CM

## 2020-06-22 DIAGNOSIS — G8929 Other chronic pain: Secondary | ICD-10-CM

## 2020-06-22 LAB — POCT URINALYSIS DIPSTICK (MANUAL)
Leukocytes, UA: NEGATIVE
Nitrite, UA: NEGATIVE
Poct Bilirubin: NEGATIVE
Poct Blood: NEGATIVE
Poct Glucose: 1000 mg/dL — AB
Poct Ketones: NEGATIVE
Poct Protein: NEGATIVE mg/dL
Poct Urobilinogen: NORMAL mg/dL
Spec Grav, UA: 1.025 (ref 1.010–1.025)
pH, UA: 5.5 (ref 5.0–8.0)

## 2020-06-22 LAB — POCT GLYCOSYLATED HEMOGLOBIN (HGB A1C)
HbA1c POC (<> result, manual entry): 10 % (ref 4.0–5.6)
HbA1c, POC (controlled diabetic range): 10 % — AB (ref 0.0–7.0)
HbA1c, POC (prediabetic range): 10 % — AB (ref 5.7–6.4)
Hemoglobin A1C: 10 % — AB (ref 4.0–5.6)

## 2020-06-22 NOTE — Patient Instructions (Signed)
Chronic Back Pain When back pain lasts longer than 3 months, it is called chronic back pain. Pain may get worse at certain times (flare-ups). There are things you can do at home to manage your pain. Follow these instructions at home: Activity      Avoid bending and other activities that make pain worse.  When standing: ? Keep your upper back and neck straight. ? Keep your shoulders pulled back. ? Avoid slouching.  When sitting: ? Keep your back straight. ? Relax your shoulders. Do not round your shoulders or pull them backward.  Do not sit or stand in one place for long periods of time.  Take short rest breaks during the day. Lying down or standing is usually better than sitting. Resting can help relieve pain.  When sitting or lying down for a long time, do some mild activity or stretching. This will help to prevent stiffness and pain.  Get regular exercise. Ask your doctor what activities are safe for you.  Do not lift anything that is heavier than 10 lb (4.5 kg). To prevent injury when you lift things: ? Bend your knees. ? Keep the weight close to your body. ? Avoid twisting. Managing pain  If told, put ice on the painful area. Your doctor may tell you to use ice for 24-48 hours after a flare-up starts. ? Put ice in a plastic bag. ? Place a towel between your skin and the bag. ? Leave the ice on for 20 minutes, 2-3 times a day.  If told, put heat on the painful area as often as told by your doctor. Use the heat source that your doctor recommends, such as a moist heat pack or a heating pad. ? Place a towel between your skin and the heat source. ? Leave the heat on for 20-30 minutes. ? Remove the heat if your skin turns bright red. This is especially important if you are unable to feel pain, heat, or cold. You may have a greater risk of getting burned.  Soak in a warm bath. This can help relieve pain.  Take over-the-counter and prescription medicines only as told by your  doctor. General instructions  Sleep on a firm mattress. Try lying on your side with your knees slightly bent. If you lie on your back, put a pillow under your knees.  Keep all follow-up visits as told by your doctor. This is important. Contact a doctor if:  You have pain that does not get better with rest or medicine. Get help right away if:  One or both of your arms or legs feel weak.  One or both of your arms or legs lose feeling (numbness).  You have trouble controlling when you poop (bowel movement) or pee (urinate).  You feel sick to your stomach (nauseous).  You throw up (vomit).  You have belly (abdominal) pain.  You have shortness of breath.  You pass out (faint). Summary  When back pain lasts longer than 3 months, it is called chronic back pain.  Pain may get worse at certain times (flare-ups).  Use ice and heat as told by your doctor. Your doctor may tell you to use ice after flare-ups. This information is not intended to replace advice given to you by your health care provider. Make sure you discuss any questions you have with your health care provider. Document Revised: 02/12/2019 Document Reviewed: 06/06/2017 Elsevier Patient Education  [place]. Shoulder Pain Many things can cause shoulder pain, including:  An  injury.  Moving the shoulder in the same way again and again (overuse).  Joint pain (arthritis). Pain can come from:  Swelling and irritation (inflammation) of any part of the shoulder.  An injury to the shoulder joint.  An injury to: ? Tissues that connect muscle to bone (tendons). ? Tissues that connect bones to each other (ligaments). ? Bones. Follow these instructions at home: Watch for changes in your symptoms. Let your doctor know about them. Follow these instructions to help with your pain. If you have a sling:  Wear the sling as told by your doctor. Remove it only as told by your doctor.  Loosen the sling if your  fingers: ? Tingle. ? Become numb. ? Turn cold and blue.  Keep the sling clean.  If the sling is not waterproof: ? Do not let it get wet. ? Take the sling off when you shower or bathe. Managing pain, stiffness, and swelling   If told, put ice on the painful area: ? Put ice in a plastic bag. ? Place a towel between your skin and the bag. ? Leave the ice on for 20 minutes, 2-3 times a day. Stop putting ice on if it does not help with the pain.  Squeeze a soft ball or a foam pad as much as possible. This prevents swelling in the shoulder. It also helps to strengthen the arm. General instructions  Take over-the-counter and prescription medicines only as told by your doctor.  Keep all follow-up visits as told by your doctor. This is important. Contact a doctor if:  Your pain gets worse.  Medicine does not help your pain.  You have new pain in your arm, hand, or fingers. Get help right away if:  Your arm, hand, or fingers: ? Tingle. ? Are numb. ? Are swollen. ? Are painful. ? Turn white or blue. Summary  Shoulder pain can be caused by many things. These include injury, moving the shoulder in the same away again and again, and joint pain.  Watch for changes in your symptoms. Let your doctor know about them.  This condition may be treated with a sling, ice, and pain medicine.  Contact your doctor if the pain gets worse or you have new pain. Get help right away if your arm, hand, or fingers tingle or get numb, swollen, or painful.  Keep all follow-up visits as told by your doctor. This is important. This information is not intended to replace advice given to you by your health care provider. Make sure you discuss any questions you have with your health care provider. Document Revised: 05/06/2018 Document Reviewed: 05/06/2018 Elsevier Patient Education  [place].

## 2020-06-22 NOTE — Progress Notes (Signed)
[place], [place]  [address] Phone:  [phone]   Fax:  [phone]   Acute Office Visit  Subjective:    Patient ID: Dean Robertson, male    DOB: 25-Sep-1961, 59 y.o.   MRN: [id number]  Chief Complaint  Patient presents with  . Back Pain    Lower back pain, occurs at anytime, difficulty sleeping at night.   Taking Ibuprofen for pain  . Shoulder Pain    Right shoulder pain, rotator cuff pain radiating down both legs     HPI Patient is in today for pain. He  has a past medical history of Anxiety, Arthritis, Coronary artery disease, Diabetes mellitus without complication ([place]), H/O heart artery stent, High cholesterol, and Hypertension.   Shoulder Pain Patient complains of right shoulder pain. The symptoms began several years ago. Aggravating factors: work related injury 20 yrs ago. Pain is located around the acromioclavicular [facility]) joint. Discomfort is described as numbness, tingling and huring pain that causes him not sleep at night. Symptoms are exacerbated by lying on the shoulder. Evaluation to date: plain films: abnormal degenerative changes. Therapy to date includes: rest, ice, OTC analgesics which are somewhat effective, prescription NSAIDS which are somewhat effective, home exercises which are somewhat effective and hot showers.  Low Back Pain Patient complains of chronic low back pain. . The patient first noted symptoms several years ago. It was related to no known injury and Work-related injury. The pain is rated moderate, and is located at the lower back. The pain is described as aching and occurs intermittently. The symptoms has been progressive. Symptoms are exacerbated by nothing in particular. Factors which relieve the pain include NSAIDs. Other associated symptoms include tingling in the right leg and tingling in the left leg. The pain goes doen the inner thigh. Previous history of symptoms: the problem is long-standing. Treatment  efforts have included NSAID and rest, with little relief.  Past Medical History:  Diagnosis Date  . Anxiety   . Arthritis    shoulders  . Coronary artery disease   . Diabetes mellitus without complication ([place])   . H/O heart artery stent    x1  . High cholesterol   . Hypertension     Past Surgical History:  Procedure Laterality Date  . COLONOSCOPY     2012- normal   . CORONARY ANGIOPLASTY WITH STENT PLACEMENT      Family History  Problem Relation Age of Onset  . Hypertension Mother   . Diabetes Father   . Colon polyps Neg Hx   . Colon cancer Neg Hx   . Esophageal cancer Neg Hx   . Rectal cancer Neg Hx   . Stomach cancer Neg Hx     Social History   Socioeconomic History  . Marital status: Legally Separated    Spouse name: Not on file  . Number of children: Not on file  . Years of education: Not on file  . Highest education level: Not on file  Occupational History  . Not on file  Tobacco Use  . Smoking status: Former Research scientist (life sciences)  . Smokeless tobacco: Never Used  Substance and Sexual Activity  . Alcohol use: Yes    Comment: occ  . Drug use: Not Currently  . Sexual activity: Not on file  Other Topics Concern  . Not on file  Social History Narrative  . Not on file   Social Determinants of Health   Financial Resource Strain:   .  Difficulty of Paying Living Expenses: Not on file  Food Insecurity:   . Worried About Charity fundraiser in the Last Year: Not on file  . Ran Out of Food in the Last Year: Not on file  Transportation Needs:   . Lack of Transportation (Medical): Not on file  . Lack of Transportation (Non-Medical): Not on file  Physical Activity:   . Days of Exercise per Week: Not on file  . Minutes of Exercise per Session: Not on file  Stress:   . Feeling of Stress : Not on file  Social Connections:   . Frequency of Communication with Friends and Family: Not on file  . Frequency of Social Gatherings with Friends and Family: Not on file  . Attends  Religious Services: Not on file  . Active Member of Clubs or Organizations: Not on file  . Attends Archivist Meetings: Not on file  . Marital Status: Not on file  Intimate Partner Violence:   . Fear of Current or Ex-Partner: Not on file  . Emotionally Abused: Not on file  . Physically Abused: Not on file  . Sexually Abused: Not on file    Outpatient Medications Prior to Visit  Medication Sig Dispense Refill  . acetaminophen (TYLENOL) 500 MG tablet Take 1 tablet (500 mg total) by mouth every 6 (six) hours as needed. 30 tablet 0  . aspirin EC 81 MG tablet Take 81 mg by mouth daily.    . benzonatate (TESSALON) 100 MG capsule Take 1 capsule (100 mg total) by mouth every 8 (eight) hours. 21 capsule 0  . Blood Glucose Monitoring Suppl (TRUE METRIX AIR GLUCOSE METER) w/Device KIT 1 each 3 (three) times daily by Does not apply route. 1 kit 0  . busPIRone (BUSPAR) 10 MG tablet Take 1 tablet (10 mg total) by mouth 2 (two) times daily. 60 tablet 3  . gabapentin (NEURONTIN) 300 MG capsule TAKE 1 CAPSULE(300 MG) BY MOUTH THREE TIMES DAILY 270 capsule 3  . glucose blood (TRUE METRIX BLOOD GLUCOSE TEST) test strip Use as instructed 100 each 12  . insulin glargine (LANTUS SOLOSTAR) 100 UNIT/ML Solostar Pen Inject 50 Units into the skin at bedtime. 15 mL 3  . Lancets MISC 1 each 3 (three) times daily by Does not apply route. 100 each 11  . lisinopril (ZESTRIL) 20 MG tablet TAKE 1 TABLET(20 MG) BY MOUTH DAILY 90 tablet 3  . Menthol, Topical Analgesic, (BIOFREEZE) 4 % GEL Apply 1 application topically 4 (four) times daily as needed. 89 mL 3  . metFORMIN (GLUCOPHAGE) 1000 MG tablet Take 1 tablet (1,000 mg total) by mouth 2 (two) times daily with a meal. 180 tablet 3  . methocarbamol (ROBAXIN) 500 MG tablet Take 1 tablet (500 mg total) by mouth 2 (two) times daily as needed for muscle spasms. 60 tablet 3  . Multiple Vitamins-Minerals (VITRUM SENIOR) TABS Take by mouth.    . naproxen (NAPROSYN) 375  MG tablet Take 1 tablet (375 mg total) by mouth 2 (two) times daily with a meal. 20 tablet 0  . propranolol (INDERAL) 40 MG tablet Take 1 tablet (40 mg total) by mouth 2 (two) times daily. As needed for anxiety 60 tablet 6  . simvastatin (ZOCOR) 20 MG tablet Take 1 tablet (20 mg total) by mouth daily at 6 PM. 90 tablet 3  . traMADol (ULTRAM) 50 MG tablet TAKE 1 TABLET(50 MG) BY MOUTH EVERY 8 HOURS FOR UP TO 5 DAYS AS NEEDED 10 tablet  0   Facility-Administered Medications Prior to Visit  Medication Dose Route Frequency Provider Last Rate Last Admin  . 0.9 %  sodium chloride infusion  500 mL Intravenous Once Cirigliano, Vito V, DO        No Known Allergies  Review of Systems  Musculoskeletal: Positive for arthralgias and myalgias.       Right shoulder  Bilateral leg pain       Objective:    Physical Exam Constitutional:      General: He is not in acute distress.    Appearance: He is not ill-appearing, toxic-appearing or diaphoretic.  HENT:     Head: Normocephalic and atraumatic.  Cardiovascular:     Rate and Rhythm: Normal rate and regular rhythm.     Pulses: Normal pulses.     Heart sounds: Normal heart sounds.  Pulmonary:     Effort: Pulmonary effort is normal.     Breath sounds: Normal breath sounds.  Musculoskeletal:     Right shoulder: Bony tenderness and crepitus present. Decreased range of motion. Decreased strength.     Left shoulder: Normal.     Cervical back: Normal range of motion.  Skin:    General: Skin is warm and dry.  Neurological:     Mental Status: He is alert and oriented to person, place, and time.  Psychiatric:        Mood and Affect: Mood normal.        Behavior: Behavior normal.        Thought Content: Thought content normal.        Judgment: Judgment normal.     BP (!) 154/89   Pulse 84   Temp (!) 97.3 F (36.3 C) (Temporal)   Ht 5' 8"  (1.727 m)   Wt 158 lb (71.7 kg)   SpO2 98%   BMI 24.02 kg/m  Wt Readings from Last 3 Encounters:    06/22/20 158 lb (71.7 kg)  05/09/20 169 lb (76.7 kg)  05/06/20 155 lb 12.8 oz (70.7 kg)    Health Maintenance Due  Topic Date Due  . OPHTHALMOLOGY EXAM  Never done  . COVID-19 Vaccine (1) Never done  . FOOT EXAM  09/19/2018  . INFLUENZA VACCINE  06/05/2020    There are no preventive care reminders to display for this patient.   Lab Results  Component Value Date   TSH 0.605 05/06/2020   Lab Results  Component Value Date   WBC 4.0 05/06/2020   HGB 14.9 05/06/2020   HCT 42.1 05/06/2020   MCV 86 05/06/2020   PLT 283 05/06/2020   Lab Results  Component Value Date   NA 137 11/06/2018   K 4.4 11/06/2018   CO2 24 11/06/2018   GLUCOSE 168 (H) 11/06/2018   BUN 17 11/06/2018   CREATININE 1.13 11/06/2018   BILITOT 0.4 03/05/2018   ALKPHOS 59 03/05/2018   AST 20 03/05/2018   ALT 25 03/05/2018   PROT 6.9 03/05/2018   ALBUMIN 4.5 03/05/2018   CALCIUM 9.4 11/06/2018   ANIONGAP 8 01/11/2018   Lab Results  Component Value Date   CHOL 220 (H) 05/06/2020   Lab Results  Component Value Date   HDL 82 05/06/2020   Lab Results  Component Value Date   LDLCALC 123 (H) 05/06/2020   Lab Results  Component Value Date   TRIG 86 05/06/2020   Lab Results  Component Value Date   CHOLHDL 2.7 05/06/2020   Lab Results  Component Value Date   HGBA1C  10.0 (A) 06/22/2020   HGBA1C 10.0 06/22/2020   HGBA1C 10.0 (A) 06/22/2020   HGBA1C 10.0 (A) 06/22/2020       Assessment & Plan:   Problem List Items Addressed This Visit    None    Visit Diagnoses    Uncontrolled type 2 diabetes mellitus with hyperglycemia ([place])    -  Primary Encourage compliance with current treatment regimen  Encourage regular CBG monitoring Encourage contacting office if excessive hyperglycemia and or hypoglycemia Lifestyle modification with healthy diet (fewer calories, more high fiber foods, whole grains and non-starchy vegetables, lower fat meat and fish, low-fat diary include healthy oils) regular  exercise (physical activity) Opthalmology exam discussed  Nutritional consult recommended Regular dental visits encouraged Home BP monitoring also encouraged goal <130/80     Relevant Orders   POCT Urinalysis Dip Manual (Completed)   POCT HgB A1C (Completed)   Right shoulder pain, unspecified chronicity       Relevant Orders   Ambulatory referral to Physical Therapy   Chronic bilateral low back pain with bilateral sciatica       Relevant Orders   Ambulatory referral to Physical Therapy       No orders of the defined types were placed in this encounter.    Vevelyn Francois, NP

## 2020-07-01 ENCOUNTER — Telehealth: Payer: Self-pay | Admitting: Nurse Practitioner

## 2020-07-04 NOTE — Telephone Encounter (Signed)
Can you please check on this referral thanks

## 2020-07-07 ENCOUNTER — Other Ambulatory Visit: Payer: Self-pay | Admitting: Nurse Practitioner

## 2020-07-07 DIAGNOSIS — M25511 Pain in right shoulder: Secondary | ICD-10-CM

## 2020-07-07 DIAGNOSIS — M5442 Lumbago with sciatica, left side: Secondary | ICD-10-CM

## 2020-07-07 NOTE — Telephone Encounter (Signed)
Can you please choose a therapy location and I will call and schedule the appointment

## 2020-07-07 NOTE — Progress Notes (Signed)
   [facility] [address] [place], [place]  [address] Phone:  [phone]   Fax:  [phone] 

## 2020-07-22 ENCOUNTER — Other Ambulatory Visit: Payer: Self-pay

## 2020-07-22 ENCOUNTER — Ambulatory Visit (INDEPENDENT_AMBULATORY_CARE_PROVIDER_SITE_OTHER): Payer: Commercial Managed Care - PPO | Admitting: Nurse Practitioner

## 2020-07-22 ENCOUNTER — Encounter: Payer: Self-pay | Admitting: Nurse Practitioner

## 2020-07-22 VITALS — BP 111/78 | HR 68 | Temp 97.7°F | Ht 68.0 in | Wt 163.0 lb

## 2020-07-22 DIAGNOSIS — M5441 Lumbago with sciatica, right side: Secondary | ICD-10-CM | POA: Diagnosis not present

## 2020-07-22 DIAGNOSIS — E1165 Type 2 diabetes mellitus with hyperglycemia: Secondary | ICD-10-CM

## 2020-07-22 DIAGNOSIS — M25512 Pain in left shoulder: Secondary | ICD-10-CM

## 2020-07-22 DIAGNOSIS — G8929 Other chronic pain: Secondary | ICD-10-CM

## 2020-07-22 DIAGNOSIS — M25511 Pain in right shoulder: Secondary | ICD-10-CM

## 2020-07-22 DIAGNOSIS — M5442 Lumbago with sciatica, left side: Secondary | ICD-10-CM

## 2020-07-22 MED ORDER — KETOROLAC TROMETHAMINE 60 MG/2ML IM SOLN: 60.0000 mg | Freq: Once | INTRAMUSCULAR | Status: DC

## 2020-07-22 MED ORDER — KETOROLAC TROMETHAMINE 60 MG/2ML IM SOLN
60.0000 mg | Freq: Once | INTRAMUSCULAR | Status: AC
  Administered 2020-07-22: 60 mg via INTRAMUSCULAR

## 2020-07-22 NOTE — Progress Notes (Signed)
[place], [place]  [address] Phone:  [phone]   Fax:  [phone]   Established Patient Office Visit  Subjective:  Patient ID: Dean Robertson, male    DOB: 1961-06-18  Age: 59 y.o. MRN: [id number]  CC:  Chief Complaint  Patient presents with  . Follow-up    right shoulder pain, back pain    HPI Dean Robertson presents for follow up on pain. He  has a past medical history of Anxiety, Arthritis, Coronary artery disease, Diabetes mellitus without complication ([place]), H/O heart artery stent, High cholesterol, and Hypertension.    Shoulder Pain Patient complains of right shoulder pain. The symptoms began several years ago. Aggravating factors: work related injury 20 yrs ago. Pain is located around the acromioclavicular [facility]) joint. Discomfort is described as numbness, tingling and huring pain that causes him not sleep at night. Symptoms are exacerbated by lying on the shoulder. Evaluation to date: plain films: abnormal degenerative changes. Therapy to date includes: rest, ice, OTC analgesics which are somewhat effective, prescription NSAIDS which are somewhat effective, home exercises which are somewhat effective and hot showers.  He admits that he does not like needles. He is willing to do what it takes to take the pain away.  He was awaiting physical therapy referral appointment.  The referral has been made have the patient never received call.  Low Back Pain Patient complains of chronic low back pain.The patient first noted symptoms several years ago. It was related to  Work-related injury. The pain is rated moderate, and is located at the lower back. The pain is described as aching and occurs intermittently. The symptoms has been progressive. Symptoms are exacerbated by nothing in particular. Factors which relieve the pain include NSAIDs. Other associated symptoms include tingling in the right leg and tingling in the left leg. The pain goes  doen the inner thigh. Previous history of symptoms: the problem is long-standing. Treatment efforts have included NSAID and rest, with little relief.   Past Medical History:  Diagnosis Date  . Anxiety   . Arthritis    shoulders  . Coronary artery disease   . Diabetes mellitus without complication ([place])   . H/O heart artery stent    x1  . High cholesterol   . Hypertension     Past Surgical History:  Procedure Laterality Date  . COLONOSCOPY     2012- normal   . CORONARY ANGIOPLASTY WITH STENT PLACEMENT      Family History  Problem Relation Age of Onset  . Hypertension Mother   . Diabetes Father   . Colon polyps Neg Hx   . Colon cancer Neg Hx   . Esophageal cancer Neg Hx   . Rectal cancer Neg Hx   . Stomach cancer Neg Hx     Social History   Socioeconomic History  . Marital status: Legally Separated    Spouse name: Not on file  . Number of children: Not on file  . Years of education: Not on file  . Highest education level: Not on file  Occupational History  . Not on file  Tobacco Use  . Smoking status: Former Research scientist (life sciences)  . Smokeless tobacco: Never Used  Substance and Sexual Activity  . Alcohol use: Yes    Comment: occ  . Drug use: Not Currently  . Sexual activity: Not on file  Other Topics Concern  . Not on file  Social History Narrative  . Not on file  Social Determinants of Health   Financial Resource Strain:   . Difficulty of Paying Living Expenses: Not on file  Food Insecurity:   . Worried About Charity fundraiser in the Last Year: Not on file  . Ran Out of Food in the Last Year: Not on file  Transportation Needs:   . Lack of Transportation (Medical): Not on file  . Lack of Transportation (Non-Medical): Not on file  Physical Activity:   . Days of Exercise per Week: Not on file  . Minutes of Exercise per Session: Not on file  Stress:   . Feeling of Stress : Not on file  Social Connections:   . Frequency of Communication with Friends and Family:  Not on file  . Frequency of Social Gatherings with Friends and Family: Not on file  . Attends Religious Services: Not on file  . Active Member of Clubs or Organizations: Not on file  . Attends Archivist Meetings: Not on file  . Marital Status: Not on file  Intimate Partner Violence:   . Fear of Current or Ex-Partner: Not on file  . Emotionally Abused: Not on file  . Physically Abused: Not on file  . Sexually Abused: Not on file    Outpatient Medications Prior to Visit  Medication Sig Dispense Refill  . acetaminophen (TYLENOL) 500 MG tablet Take 1 tablet (500 mg total) by mouth every 6 (six) hours as needed. 30 tablet 0  . aspirin EC 81 MG tablet Take 81 mg by mouth daily.    . benzonatate (TESSALON) 100 MG capsule Take 1 capsule (100 mg total) by mouth every 8 (eight) hours. 21 capsule 0  . Blood Glucose Monitoring Suppl (TRUE METRIX AIR GLUCOSE METER) w/Device KIT 1 each 3 (three) times daily by Does not apply route. 1 kit 0  . busPIRone (BUSPAR) 10 MG tablet Take 1 tablet (10 mg total) by mouth 2 (two) times daily. 60 tablet 3  . gabapentin (NEURONTIN) 300 MG capsule TAKE 1 CAPSULE(300 MG) BY MOUTH THREE TIMES DAILY 270 capsule 3  . glucose blood (TRUE METRIX BLOOD GLUCOSE TEST) test strip Use as instructed 100 each 12  . insulin glargine (LANTUS SOLOSTAR) 100 UNIT/ML Solostar Pen Inject 50 Units into the skin at bedtime. 15 mL 3  . Lancets MISC 1 each 3 (three) times daily by Does not apply route. 100 each 11  . lisinopril (ZESTRIL) 20 MG tablet TAKE 1 TABLET(20 MG) BY MOUTH DAILY 90 tablet 3  . Menthol, Topical Analgesic, (BIOFREEZE) 4 % GEL Apply 1 application topically 4 (four) times daily as needed. 89 mL 3  . metFORMIN (GLUCOPHAGE) 1000 MG tablet Take 1 tablet (1,000 mg total) by mouth 2 (two) times daily with a meal. 180 tablet 3  . methocarbamol (ROBAXIN) 500 MG tablet Take 1 tablet (500 mg total) by mouth 2 (two) times daily as needed for muscle spasms. 60 tablet 3    . Multiple Vitamins-Minerals (VITRUM SENIOR) TABS Take by mouth.    . naproxen (NAPROSYN) 375 MG tablet Take 1 tablet (375 mg total) by mouth 2 (two) times daily with a meal. 20 tablet 0  . propranolol (INDERAL) 40 MG tablet Take 1 tablet (40 mg total) by mouth 2 (two) times daily. As needed for anxiety 60 tablet 6  . simvastatin (ZOCOR) 20 MG tablet Take 1 tablet (20 mg total) by mouth daily at 6 PM. 90 tablet 3  . traMADol (ULTRAM) 50 MG tablet TAKE 1 TABLET(50 MG) BY  MOUTH EVERY 8 HOURS FOR UP TO 5 DAYS AS NEEDED 10 tablet 0   Facility-Administered Medications Prior to Visit  Medication Dose Route Frequency Provider Last Rate Last Admin  . 0.9 %  sodium chloride infusion  500 mL Intravenous Once Cirigliano, Vito V, DO        No Known Allergies  ROS Review of Systems    Objective:    Physical Exam Constitutional:      General: He is not in acute distress.    Appearance: Normal appearance. He is not ill-appearing, toxic-appearing or diaphoretic.  HENT:     Head: Normocephalic and atraumatic.  Cardiovascular:     Rate and Rhythm: Normal rate.     Pulses: Normal pulses.  Musculoskeletal:     Cervical back: Normal range of motion.  Skin:    General: Skin is warm.     Capillary Refill: Capillary refill takes less than 2 seconds.  Neurological:     General: No focal deficit present.     Mental Status: He is alert and oriented to person, place, and time.  Psychiatric:        Mood and Affect: Mood normal.        Behavior: Behavior normal.     BP 111/78   Pulse 68   Temp 97.7 F (36.5 C) (Temporal)   Ht 5' 8"  (1.727 m)   Wt 163 lb (73.9 kg)   SpO2 100%   BMI 24.78 kg/m  Wt Readings from Last 3 Encounters:  07/22/20 163 lb (73.9 kg)  06/22/20 158 lb (71.7 kg)  05/09/20 169 lb (76.7 kg)     Health Maintenance Due  Topic Date Due  . OPHTHALMOLOGY EXAM  Never done  . COVID-19 Vaccine (1) Never done  . FOOT EXAM  09/19/2018  . INFLUENZA VACCINE  06/05/2020     There are no preventive care reminders to display for this patient.  Lab Results  Component Value Date   TSH 0.605 05/06/2020   Lab Results  Component Value Date   WBC 4.0 05/06/2020   HGB 14.9 05/06/2020   HCT 42.1 05/06/2020   MCV 86 05/06/2020   PLT 283 05/06/2020   Lab Results  Component Value Date   NA 137 11/06/2018   K 4.4 11/06/2018   CO2 24 11/06/2018   GLUCOSE 168 (H) 11/06/2018   BUN 17 11/06/2018   CREATININE 1.13 11/06/2018   BILITOT 0.4 03/05/2018   ALKPHOS 59 03/05/2018   AST 20 03/05/2018   ALT 25 03/05/2018   PROT 6.9 03/05/2018   ALBUMIN 4.5 03/05/2018   CALCIUM 9.4 11/06/2018   ANIONGAP 8 01/11/2018   Lab Results  Component Value Date   CHOL 220 (H) 05/06/2020   Lab Results  Component Value Date   HDL 82 05/06/2020   Lab Results  Component Value Date   LDLCALC 123 (H) 05/06/2020   Lab Results  Component Value Date   TRIG 86 05/06/2020   Lab Results  Component Value Date   CHOLHDL 2.7 05/06/2020   Lab Results  Component Value Date   HGBA1C 10.0 (A) 06/22/2020   HGBA1C 10.0 06/22/2020   HGBA1C 10.0 (A) 06/22/2020   HGBA1C 10.0 (A) 06/22/2020      Assessment & Plan:   Problem List Items Addressed This Visit    None    Visit Diagnoses    Uncontrolled type 2 diabetes mellitus with hyperglycemia ([place])    -  Primary Encourage compliance with current treatment regimen Encourage  regular CBG monitoring Encourage contacting office if excessive hyperglycemia and or hypoglycemia Lifestyle modification with healthy diet (fewer calories, more high fiber foods, whole grains and non-starchy vegetables, lower fat meat and fish, low-fat diary include healthy oils) regular exercise (physical activity) Home BP monitoring also encouraged goal <130/80     Relevant Orders   Comp. Metabolic Panel (12)   Chronic bilateral low back pain with bilateral sciatica     Physical therapy office called while patient was here for updated information on  referral   Relevant Medications   ketorolac (TORADOL) injection 60 mg (Completed)   Chronic pain of both shoulders       Relevant Medications   ketorolac (TORADOL) injection 60 mg (Completed)      Meds ordered this encounter  Medications  . DISCONTD: ketorolac (TORADOL) injection 60 mg  . ketorolac (TORADOL) injection 60 mg    Follow-up: Return for Appointment As Scheduled.    Vevelyn Francois, NP

## 2020-07-22 NOTE — Patient Instructions (Signed)
Shoulder Pain Many things can cause shoulder pain, including:  An injury to the shoulder.  Overuse of the shoulder.  Arthritis. The source of the pain can be:  Inflammation.  An injury to the shoulder joint.  An injury to a tendon, ligament, or bone. Follow these instructions at home: Pay attention to changes in your symptoms. Let your health care provider know about them. Follow these instructions to relieve your pain. If you have a sling:  Wear the sling as told by your health care provider. Remove it only as told by your health care provider.  Loosen the sling if your fingers tingle, become numb, or turn cold and blue.  Keep the sling clean.  If the sling is not waterproof: ? Do not let it get wet. Remove it to shower or bathe.  Move your arm as little as possible, but keep your hand moving to prevent swelling. Managing pain, stiffness, and swelling   If directed, put ice on the painful area: ? Put ice in a plastic bag. ? Place a towel between your skin and the bag. ? Leave the ice on for 20 minutes, 2-3 times per day. Stop applying ice if it does not help with the pain.  Squeeze a soft ball or a foam pad as much as possible. This helps to keep the shoulder from swelling. It also helps to strengthen the arm. General instructions  Take over-the-counter and prescription medicines only as told by your health care provider.  Keep all follow-up visits as told by your health care provider. This is important. Contact a health care provider if:  Your pain gets worse.  Your pain is not relieved with medicines.  New pain develops in your arm, hand, or fingers. Get help right away if:  Your arm, hand, or fingers: ? Tingle. ? Become numb. ? Become swollen. ? Become painful. ? Turn white or blue. Summary  Shoulder pain can be caused by an injury, overuse, or arthritis.  Pay attention to changes in your symptoms. Let your health care provider know about  them.  This condition may be treated with a sling, ice, and pain medicines.  Contact your health care provider if the pain gets worse or new pain develops. Get help right away if your arm, hand, or fingers tingle or become numb, swollen, or painful.  Keep all follow-up visits as told by your health care provider. This is important. This information is not intended to replace advice given to you by your health care provider. Make sure you discuss any questions you have with your health care provider. Document Revised: 05/06/2018 Document Reviewed: 05/06/2018 Elsevier Patient Education  [place]. Chronic Back Pain When back pain lasts longer than 3 months, it is called chronic back pain. Pain may get worse at certain times (flare-ups). There are things you can do at home to manage your pain. Follow these instructions at home: Activity      Avoid bending and other activities that make pain worse.  When standing: ? Keep your upper back and neck straight. ? Keep your shoulders pulled back. ? Avoid slouching.  When sitting: ? Keep your back straight. ? Relax your shoulders. Do not round your shoulders or pull them backward.  Do not sit or stand in one place for long periods of time.  Take short rest breaks during the day. Lying down or standing is usually better than sitting. Resting can help relieve pain.  When sitting or lying down for a  long time, do some mild activity or stretching. This will help to prevent stiffness and pain.  Get regular exercise. Ask your doctor what activities are safe for you.  Do not lift anything that is heavier than 10 lb (4.5 kg). To prevent injury when you lift things: ? Bend your knees. ? Keep the weight close to your body. ? Avoid twisting. Managing pain  If told, put ice on the painful area. Your doctor may tell you to use ice for 24-48 hours after a flare-up starts. ? Put ice in a plastic bag. ? Place a towel between your skin and  the bag. ? Leave the ice on for 20 minutes, 2-3 times a day.  If told, put heat on the painful area as often as told by your doctor. Use the heat source that your doctor recommends, such as a moist heat pack or a heating pad. ? Place a towel between your skin and the heat source. ? Leave the heat on for 20-30 minutes. ? Remove the heat if your skin turns bright red. This is especially important if you are unable to feel pain, heat, or cold. You may have a greater risk of getting burned.  Soak in a warm bath. This can help relieve pain.  Take over-the-counter and prescription medicines only as told by your doctor. General instructions  Sleep on a firm mattress. Try lying on your side with your knees slightly bent. If you lie on your back, put a pillow under your knees.  Keep all follow-up visits as told by your doctor. This is important. Contact a doctor if:  You have pain that does not get better with rest or medicine. Get help right away if:  One or both of your arms or legs feel weak.  One or both of your arms or legs lose feeling (numbness).  You have trouble controlling when you poop (bowel movement) or pee (urinate).  You feel sick to your stomach (nauseous).  You throw up (vomit).  You have belly (abdominal) pain.  You have shortness of breath.  You pass out (faint). Summary  When back pain lasts longer than 3 months, it is called chronic back pain.  Pain may get worse at certain times (flare-ups).  Use ice and heat as told by your doctor. Your doctor may tell you to use ice after flare-ups. This information is not intended to replace advice given to you by your health care provider. Make sure you discuss any questions you have with your health care provider. Document Revised: 02/12/2019 Document Reviewed: 06/06/2017 Elsevier Patient Education  2020 [employer].

## 2020-07-23 LAB — COMP. METABOLIC PANEL (12)
AST: 24 IU/L (ref 0–40)
Albumin/Globulin Ratio: 1.8 (ref 1.2–2.2)
Albumin: 4.3 g/dL (ref 3.8–4.9)
Alkaline Phosphatase: 76 IU/L (ref 44–121)
BUN/Creatinine Ratio: 18 (ref 10–24)
BUN: 21 mg/dL (ref 8–27)
Bilirubin Total: 0.3 mg/dL (ref 0.0–1.2)
Calcium: 9.4 mg/dL (ref 8.6–10.2)
Chloride: 99 mmol/L (ref 96–106)
Creatinine, Ser: 1.14 mg/dL (ref 0.76–1.27)
GFR calc Af Amer: 80 mL/min/{1.73_m2} (ref >59)
GFR calc non Af Amer: 70 mL/min/{1.73_m2} (ref >59)
Globulin, Total: 2.4 g/dL (ref 1.5–4.5)
Glucose: 288 mg/dL — ABNORMAL HIGH (ref 65–99)
Potassium: 4.9 mmol/L (ref 3.5–5.2)
Sodium: 138 mmol/L (ref 134–144)
Total Protein: 6.7 g/dL (ref 6.0–8.5)

## 2020-07-26 ENCOUNTER — Telehealth: Payer: Self-pay

## 2020-07-26 NOTE — Telephone Encounter (Signed)
Spoke with Manuela Schwartz at [facility] re[facility],Susan  spoke with patient on 07-25-20.   Cost of each visit will be 50$ patient will contact insurance company possible for cheaper cost?

## 2020-07-28 ENCOUNTER — Other Ambulatory Visit: Payer: Self-pay | Admitting: Surgery

## 2020-07-28 ENCOUNTER — Ambulatory Visit (INDEPENDENT_AMBULATORY_CARE_PROVIDER_SITE_OTHER): Payer: Commercial Managed Care - PPO | Admitting: Surgery

## 2020-07-28 ENCOUNTER — Encounter: Payer: Self-pay | Admitting: Surgery

## 2020-07-28 ENCOUNTER — Telehealth: Payer: Self-pay | Admitting: Specialist

## 2020-07-28 ENCOUNTER — Ambulatory Visit: Payer: Self-pay

## 2020-07-28 VITALS — BP 146/87 | HR 70

## 2020-07-28 DIAGNOSIS — M5412 Radiculopathy, cervical region: Secondary | ICD-10-CM

## 2020-07-28 DIAGNOSIS — Z77018 Contact with and (suspected) exposure to other hazardous metals: Secondary | ICD-10-CM

## 2020-07-28 DIAGNOSIS — M5416 Radiculopathy, lumbar region: Secondary | ICD-10-CM

## 2020-07-28 DIAGNOSIS — M7541 Impingement syndrome of right shoulder: Secondary | ICD-10-CM

## 2020-07-28 DIAGNOSIS — R29898 Other symptoms and signs involving the musculoskeletal system: Secondary | ICD-10-CM

## 2020-07-28 DIAGNOSIS — M542 Cervicalgia: Secondary | ICD-10-CM

## 2020-07-28 DIAGNOSIS — M5441 Lumbago with sciatica, right side: Secondary | ICD-10-CM

## 2020-07-28 DIAGNOSIS — G8929 Other chronic pain: Secondary | ICD-10-CM

## 2020-07-28 DIAGNOSIS — M5442 Lumbago with sciatica, left side: Secondary | ICD-10-CM

## 2020-07-28 NOTE — Telephone Encounter (Signed)
Pt would like a note to be sent to [place] stating that he's gonna be out of workuntil November; pt states we already have the information to send it.

## 2020-07-28 NOTE — Telephone Encounter (Signed)
Please advise 

## 2020-07-28 NOTE — Telephone Encounter (Signed)
Patient saw Jeneen Rinks today, he has not seen Dr. Louanne Skye yet, can you please ask him about this one?

## 2020-07-28 NOTE — Progress Notes (Signed)
Office Visit Note   Patient: Dean Robertson           Date of Birth: 1961-06-05           MRN: [id number] Visit Date: 07/28/2020              Requested by: Vevelyn Francois, NP [address] [place],  [place] [address] PCP: Vevelyn Francois, NP   Assessment & Plan: Visit Diagnoses:  1. Chronic bilateral low back pain with bilateral sciatica   2. Neck pain   3. Radiculopathy, cervical region   4. Right arm weakness   5. Impingement syndrome of right shoulder   6. Radiculopathy, lumbar region     Plan: With patient's right-sided neck pain, right upper extremity radiculopathy with right arm weakness I recommend getting a stat cervical MRI to rule out HNP/stenosis.  I reviewed x-rays with patient today including his cervical spine films.  He does have disc base collapse and vertebral spurs at C5-6.  He also has a few millimeters of C5 retrolisthesis.  Today was also planning on doing a diagnostic/therapeutic right shoulder subacromial Marcaine/Depo-Medrol injection but patient's CBG in clinic today was 295.  Advised patient to work on getting his blood sugars under better control.  He states that he does not routinely check them at home and I advised him that he should be doing this.  We will recheck at his next appointment and we may consider injection at that time.  In regards to his lumbar spine issues he is actually not too bad today.  Will just watch this for now and see how he does.  He may need MRI of the lumbar spine in the future.  Follow-Up Instructions: Return in about 2 weeks (around 08/11/2020) for With Dr. Lorin Mercy to review cervical spine MRI and recheck right shoulder and low back.   Orders:  Orders Placed This Encounter  Procedures  . XR Cervical Spine 2 or 3 views  . XR Lumbar Spine Complete  . MR Cervical Spine w/o contrast   No orders of the defined types were placed in this encounter.     Procedures: No procedures performed   Clinical Data: No additional  findings.   Subjective: Chief Complaint  Patient presents with  . Right Shoulder - Pain  . Lower Back - Pain    HPI 59 year old black male who is new patient to clinic comes in today with complaints of neck pain, right shoulder pain, right arm pain numbness and tingling and low back pain.  States that right shoulder pain has been ongoing about 6 months.  No specific injury that he can recall.  Pain with overhead activity and laying directly on his shoulder.  Pain radiating down his right arm along with numbness and tingling in the right hand.  This has been ongoing x2 to 3 weeks.  States that he has had some weakness in the right arm.  No left arm symptoms.  Low back pain off and on about 8 to 9 months.  Intermittently has pain radiating down into the bilateral buttocks and thighs.  No numbness and tingling.  No lower extremity weakness.  No complaints of bowel or bladder incontinence.  Patient has been followed by his primary care for right shoulder pain.  April 11, 2020 he was also seen in the emergency room for evaluation of his shoulder pain.  Had x-rays and that report showed:  Narrative & Impression  CLINICAL DATA:  Right shoulder pain.  EXAM: RIGHT SHOULDER - 2+ VIEW  COMPARISON:  07/31/2019  FINDINGS: Degenerative changes in the [facility] joint with joint space narrowing and spurring. Glenohumeral joint is maintained. No acute bony abnormality. Specifically, no fracture, subluxation, or dislocation. Soft tissues are intact.  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 No acute bony abnormality.   Electronically Signed   By: Rolm Baptise M.D.   On: 04/11/2020 11:28    He has not seen orthopedics for before today.  Currently taking gabapentin, Ultram and naproxen without any improvement.  Review of Systems No current cardiac pulmonary GI GU issues  Objective: Vital Signs: BP (!) 146/87   Pulse 70   Physical Exam HENT:     Head: Normocephalic and  atraumatic.  Eyes:     Extraocular Movements: Extraocular movements intact.     Pupils: Pupils are equal, round, and reactive to light.  Pulmonary:     Effort: No respiratory distress.  Musculoskeletal:     Comments: 9 he does have some limitation in range of motion due to pain and stiffness.  Positive Spurling test.  Positive bilateral brachial plexus trapezius and scapular tenderness.  Right shoulder has decreased range of motion secondary to pain and question stiffness.  Positive impingement test.  Negative drop arm test.  Pain and weakness with supraspinatus resistance.  Left shoulder unremarkable.  Positive right biceps triceps and wrist flexion and extension weakness.  Left arm strong.  About elbows good range of motion.  Negative Tinel's over the cubital tunnels.  Negative Tinel's bilateral carpal tunnels.  Mild lumbar paraspinal tenderness.  Nontender over the SI joints.  Negative logroll bilateral hips.  Negative straight leg raise.  No lower extremity weakness.  Skin:    General: Skin is warm and dry.  Neurological:     Mental Status: He is alert and oriented to person, place, and time.  Psychiatric:        Mood and Affect: Mood normal.     Ortho Exam  Specialty Comments:  No specialty comments available.  Imaging: No results found.   PMFS History: Patient Active Problem List   Diagnosis Date Noted  . Tick bite 05/17/2020  . Anxiety 02/05/2019  . Essential hypertension 12/08/2017  . Type 2 diabetes mellitus without complication, with long-term current use of insulin ([place]) 12/08/2017  . Chest pain 10/04/2017  . ASHD (arteriosclerotic heart disease) 11/15/2015  . Hyperlipidemia 11/15/2015  . Neuropathy 11/15/2015  . Sprain/strain 11/27/2013   Past Medical History:  Diagnosis Date  . Anxiety   . Arthritis    shoulders  . Coronary artery disease   . Diabetes mellitus without complication ([place])   . H/O heart artery stent    x1  . High cholesterol   . Hypertension      Family History  Problem Relation Age of Onset  . Hypertension Mother   . Diabetes Father   . Colon polyps Neg Hx   . Colon cancer Neg Hx   . Esophageal cancer Neg Hx   . Rectal cancer Neg Hx   . Stomach cancer Neg Hx     Past Surgical History:  Procedure Laterality Date  . COLONOSCOPY     2012- normal   . CORONARY ANGIOPLASTY WITH STENT PLACEMENT     Social History   Occupational History  . Not on file  Tobacco Use  . Smoking status: Former Research scientist (life sciences)  . Smokeless tobacco: Never Used  Substance and Sexual Activity  . Alcohol use: Yes  Comment: occ  . Drug use: Not Currently  . Sexual activity: Not on file

## 2020-07-29 ENCOUNTER — Encounter: Payer: Self-pay | Admitting: Surgery

## 2020-07-29 ENCOUNTER — Ambulatory Visit: Admission: RE | Admit: 2020-07-29 | Payer: Commercial Managed Care - PPO | Source: Ambulatory Visit

## 2020-07-29 NOTE — Telephone Encounter (Signed)
Lvm for pt to call back to discuss  

## 2020-07-29 NOTE — Telephone Encounter (Signed)
I cannot take him out of work until November.  He can have a note taking him out until he sees Dr. Louanne Skye in 2 weeks to review cervical MRI.  If he needs a note until November he might want to call his primary care and see if they would do it that long.

## 2020-07-31 ENCOUNTER — Ambulatory Visit
Admission: RE | Admit: 2020-07-31 | Discharge: 2020-07-31 | Disposition: A | Discharge status: Home / Self Care | Payer: Commercial Managed Care - PPO | Source: Ambulatory Visit | Attending: Surgery | Admitting: Surgery

## 2020-07-31 DIAGNOSIS — M5412 Radiculopathy, cervical region: Secondary | ICD-10-CM

## 2020-07-31 DIAGNOSIS — R29898 Other symptoms and signs involving the musculoskeletal system: Secondary | ICD-10-CM

## 2020-07-31 DIAGNOSIS — M542 Cervicalgia: Secondary | ICD-10-CM

## 2020-07-31 IMAGING — MR MR CERVICAL SPINE W/O CM
5 series · 31 of 48 positions shown · non-contrast
Comparison: [DATE] cervical spine radiographs.

CLINICAL DATA: Spinal stenosis, cervical radiculopathy.

EXAM:
MRI CERVICAL SPINE WITHOUT CONTRAST
TECHNIQUE: Multiplanar, multisequence MR imaging of the cervical spine was
performed. No intravenous contrast was administered.

[Series 5: T2 · sagittal · 3.0mm · 0.82mm/px · 7 of 15 slices shown (1 of 2)]
[im 1/15]
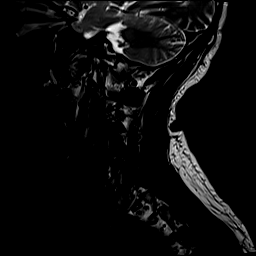
[im 3/15]
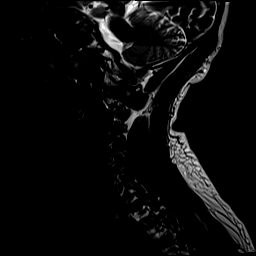
[im 5/15]
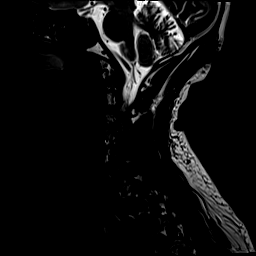
[im 8/15]
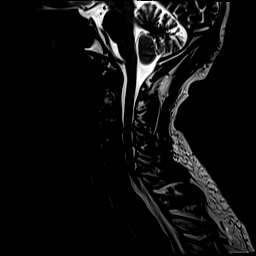
[im 10/15]
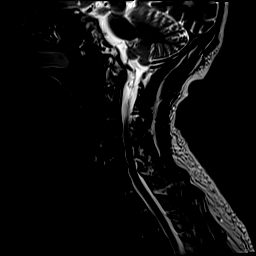
[im 12/15]
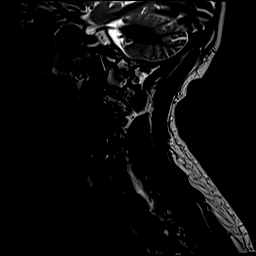
[im 15/15]
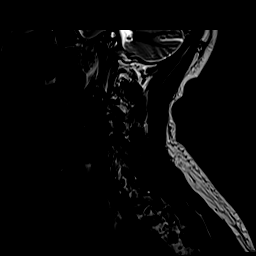

[Series 6: T1 · sagittal · 3.0mm · 0.66mm/px · 7 of 15 slices shown]
[im 1/15]
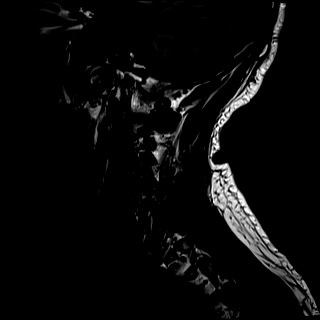
[im 3/15]
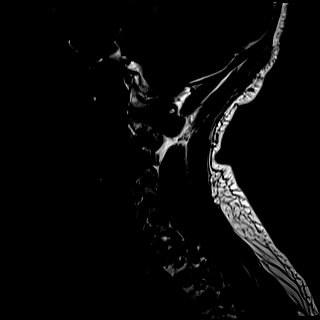
[im 5/15]
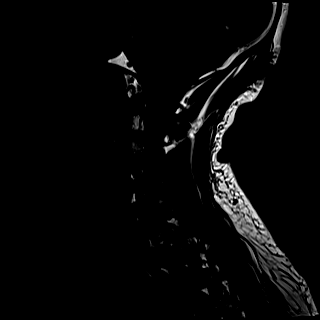
[im 8/15]
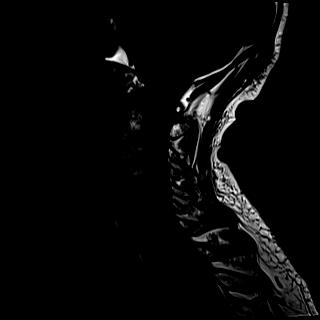
[im 10/15]
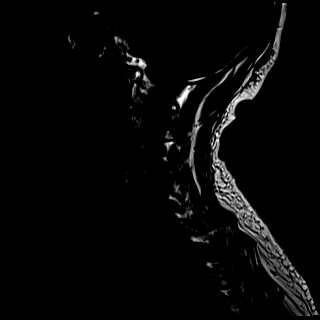
[im 12/15]
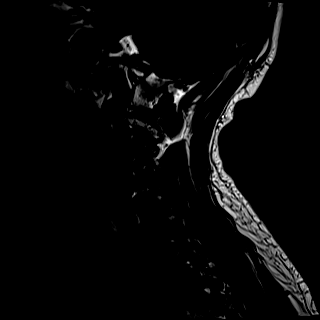
[im 15/15]
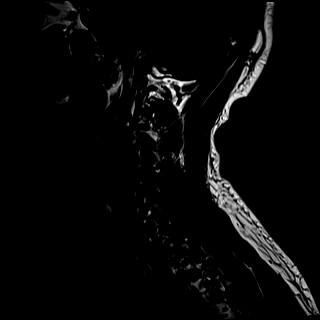

[Series 7: STIR · sagittal · 3.0mm · 0.33mm/px · 6 of 15 slices shown]
[im 1/15]
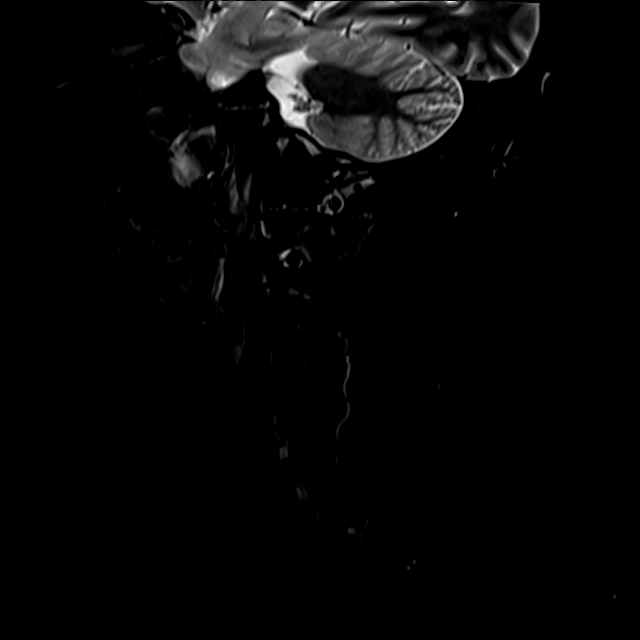
[im 3/15]
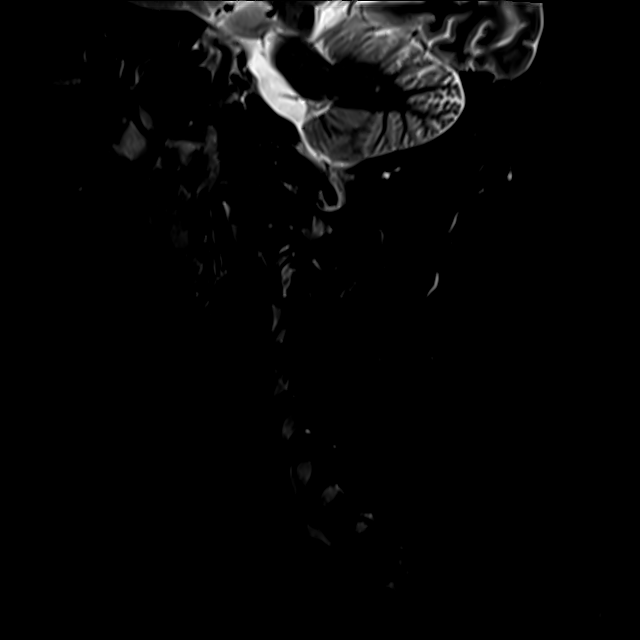
[im 6/15]
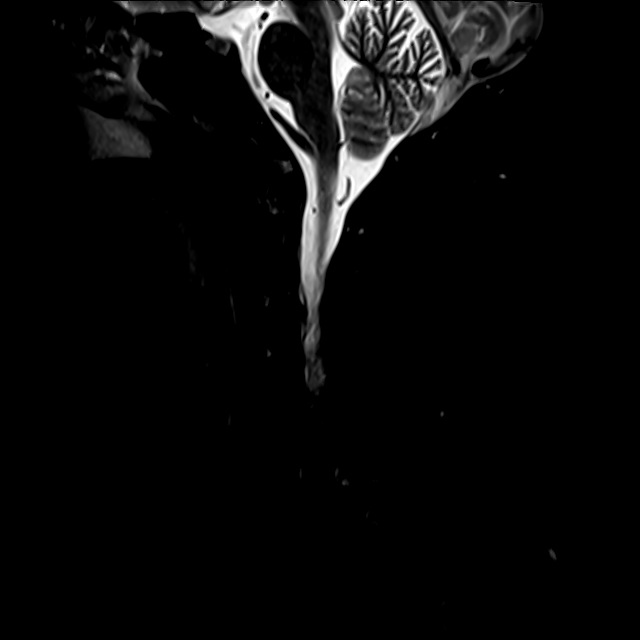
[im 9/15]
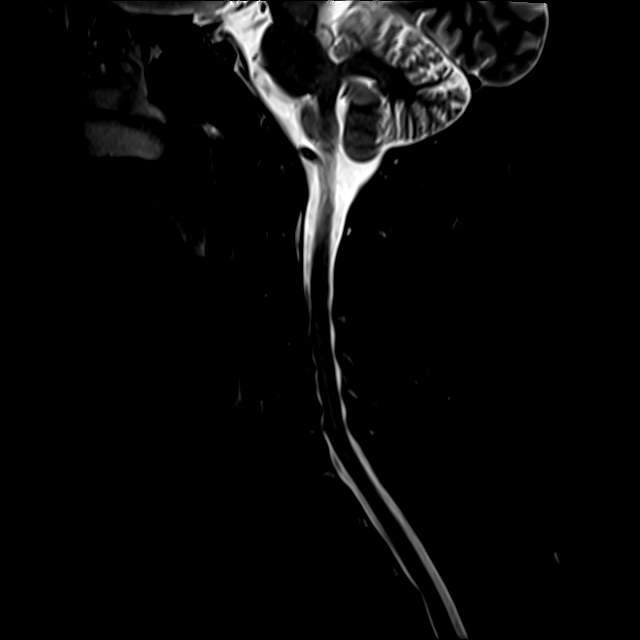
[im 12/15]
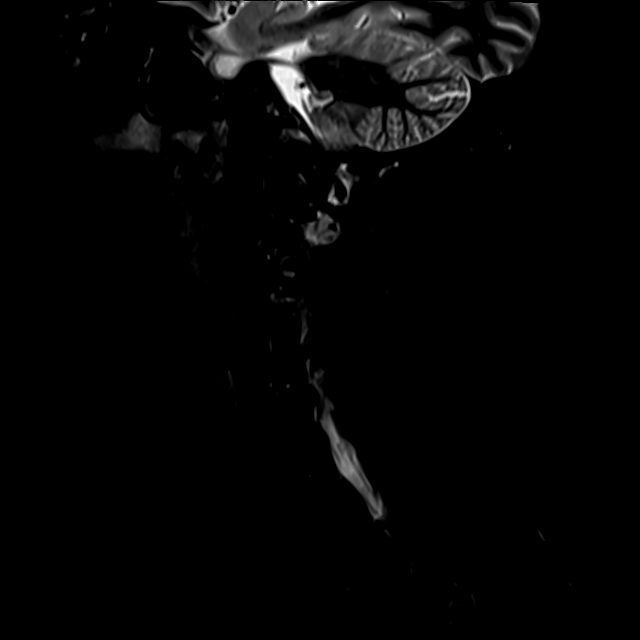
[im 15/15]
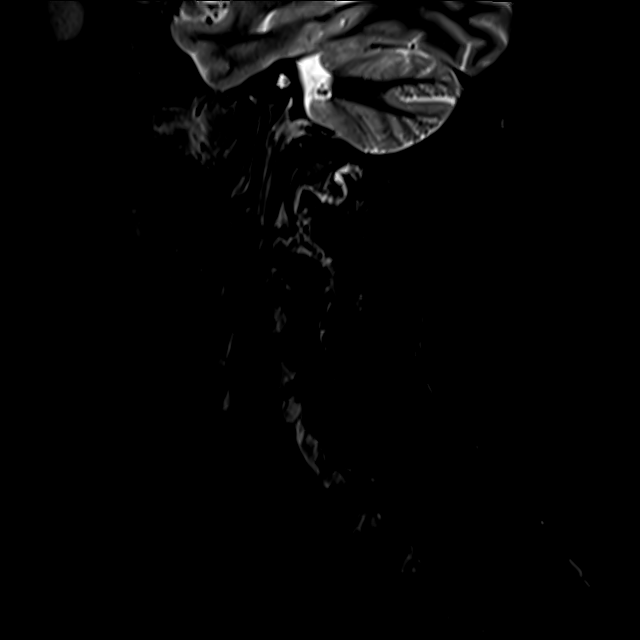

[Series 8: T2 · axial · 3.0mm · 0.50mm/px · z∈[-36,+72]mm · 8 of 34 slices shown (2 of 2)]
[im 1/34]
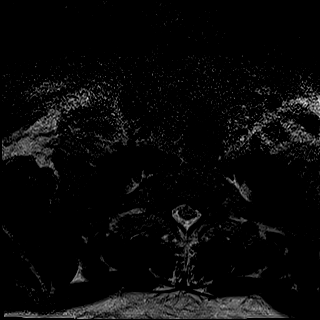
[im 6/34]
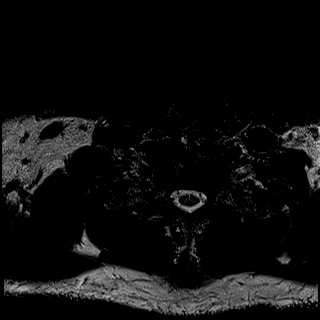
[im 11/34]
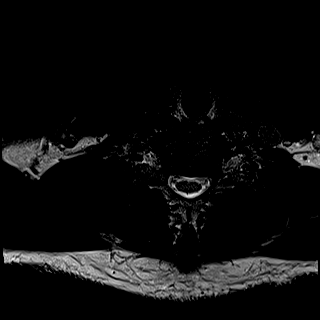
[im 16/34]
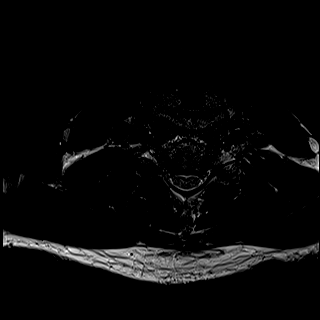
[im 18/34]
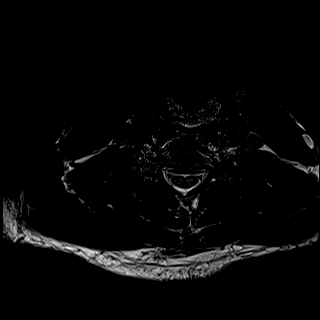
[im 23/34]
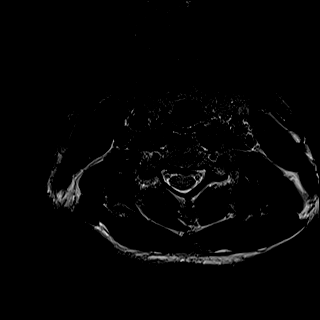
[im 28/34]
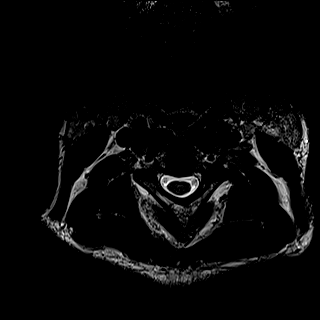
[im 34/34]
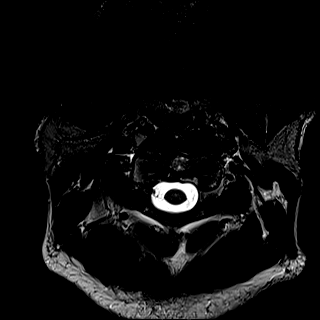

[Series 9: GRE · axial · 3.0mm · 0.42mm/px · z∈[-36,-3]mm · 3 of 34 slices shown]
[im 1/34]
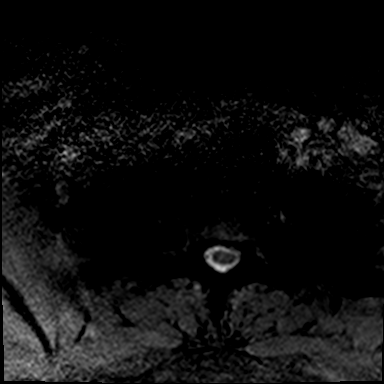
[im 6/34]
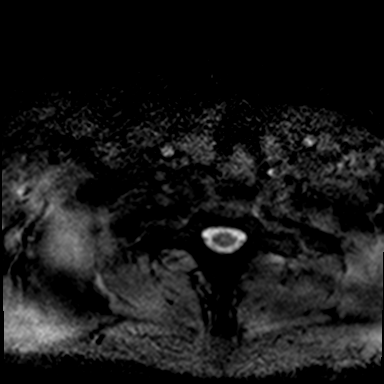
[im 11/34]
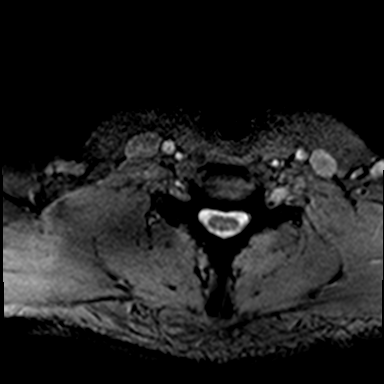

[31 of 48 positions shown; findings below may reference images not displayed]

FINDINGS: Alignment: Straightening of lordosis. Minimal grade 1 C4-5 and C5-6
retrolisthesis.

Vertebrae: Normal bone marrow signal intensity. No focal osseous
lesion.

Cord: Normal signal and morphology.

Posterior Fossa, vertebral arteries: Negative.

Disc levels: Multilevel desiccation with mild disc space loss most
prominent at the C3-6 levels.

C2-3: No significant disc bulge. Uncovertebral and facet
degenerative spurring. Patent spinal canal and right neural foramen.
Mild left neural foraminal narrowing.

C3-4: Disc osteophyte complex with superimposed right
subarticular/foraminal protrusion and right predominant
uncovertebral/facet hypertrophy. Mild spinal canal and bilateral
neural foraminal narrowing.

C4-5: Disc osteophyte complex with small central protrusion,
uncovertebral and bilateral facet degenerative spurring. Mild spinal
canal and bilateral neural foraminal narrowing.

C5-6: Disc osteophyte complex with uncovertebral and facet
hypertrophy. Mild spinal canal and moderate bilateral neural
foraminal narrowing.

C6-7: Bilateral uncovertebral and facet hypertrophy. Mild spinal
canal and left neural foraminal narrowing. Moderate right neural
foraminal narrowing.

C7-T1: No significant disc bulge. Bilateral facet degenerative
spurring. Patent spinal canal and neural foramen.

Paraspinal tissues: Negative.
IMPRESSION: No acute finding within the cervical spine.

Multilevel spondylosis.  Mild C3-7 spinal canal narrowing.

Moderate bilateral C5-6 and right C6-7 neural foraminal narrowing.

Mild left C2-3, bilateral C3-5 neural foraminal narrowing.

## 2020-08-01 NOTE — Telephone Encounter (Signed)
I spoke with the pt and he stated he didn't need a note.

## 2020-08-05 ENCOUNTER — Ambulatory Visit: Payer: Commercial Managed Care - PPO | Admitting: Family Medicine

## 2020-08-05 ENCOUNTER — Ambulatory Visit: Payer: Commercial Managed Care - PPO | Admitting: Nurse Practitioner

## 2020-08-15 ENCOUNTER — Telehealth: Payer: Self-pay

## 2020-08-15 NOTE — Telephone Encounter (Signed)
Patient wants a return phone call to discuss CT results.

## 2020-08-24 ENCOUNTER — Ambulatory Visit (INDEPENDENT_AMBULATORY_CARE_PROVIDER_SITE_OTHER): Payer: Commercial Managed Care - PPO | Admitting: Specialist

## 2020-08-24 ENCOUNTER — Encounter: Payer: Self-pay | Admitting: Specialist

## 2020-08-24 VITALS — BP 150/90 | HR 78 | Ht 68.0 in | Wt 163.0 lb

## 2020-08-24 DIAGNOSIS — R200 Anesthesia of skin: Secondary | ICD-10-CM | POA: Diagnosis not present

## 2020-08-24 DIAGNOSIS — M542 Cervicalgia: Secondary | ICD-10-CM

## 2020-08-24 DIAGNOSIS — R29898 Other symptoms and signs involving the musculoskeletal system: Secondary | ICD-10-CM | POA: Diagnosis not present

## 2020-08-24 DIAGNOSIS — M4722 Other spondylosis with radiculopathy, cervical region: Secondary | ICD-10-CM

## 2020-08-24 DIAGNOSIS — M25611 Stiffness of right shoulder, not elsewhere classified: Secondary | ICD-10-CM

## 2020-08-24 DIAGNOSIS — M19011 Primary osteoarthritis, right shoulder: Secondary | ICD-10-CM

## 2020-08-24 DIAGNOSIS — R202 Paresthesia of skin: Secondary | ICD-10-CM

## 2020-08-24 MED ORDER — MELOXICAM 15 MG PO TABS: 15.0000 mg | ORAL_TABLET | Freq: Every day | ORAL | 2 refills | Status: DC

## 2020-08-24 MED ORDER — DICLOFENAC SODIUM 1 % EX GEL: 2.0000 g | Freq: Four times a day (QID) | CUTANEOUS | 3 refills | Status: AC

## 2020-08-24 MED ORDER — BUPIVACAINE HCL 0.5 % IJ SOLN
3.0000 mL | INTRAMUSCULAR | Status: AC | PRN
  Administered 2020-08-24: 3 mL via INTRA_ARTICULAR

## 2020-08-24 MED ORDER — METHYLPREDNISOLONE ACETATE 40 MG/ML IJ SUSP
13.3300 mg | INTRAMUSCULAR | Status: AC | PRN
  Administered 2020-08-24: 13.33 mg via INTRA_ARTICULAR

## 2020-08-24 NOTE — Patient Instructions (Signed)
Avoid overhead lifting and overhead use of the arms. Do not lift greater than 5 lbs. Adjust head rest in vehicle to prevent hyperextension if rear ended. Take extra precautions to avoid falling. EMG/NCV right arm to assess for nerve compression at the neck or the right wrist. Right shoulder stretching and ROM exercises, Ice locally to the right AC joint  Meloxicam 15 mg po daily.

## 2020-08-24 NOTE — Progress Notes (Signed)
Office Visit Note   Patient: Dean Robertson           Date of Birth: October 01, 1961           MRN: [id number] Visit Date: 08/24/2020              Requested by: Vevelyn Francois, NP [address] [place],  [place] [address] PCP: Vevelyn Francois, NP   Assessment & Plan: Visit Diagnoses:  1. Neck pain   2. Right arm weakness   3. Osteoarthritis of right AC (acromioclavicular) joint   4. Numbness and tingling of right arm   5. Shoulder joint stiffness, right   6. Other spondylosis with radiculopathy, cervical region     Plan: Avoid overhead lifting and overhead use of the arms. Do not lift greater than 5 lbs. Adjust head rest in vehicle to prevent hyperextension if rear ended. Take extra precautions to avoid falling. EMG/NCV right arm to assess for nerve compression at the neck or the right wrist. Right shoulder stretching and ROM exercises, Ice locally to the right AC joint  Meloxicam 15 mg po daily.  Follow-Up Instructions: Return in about 4 weeks (around 09/21/2020).   Orders:  Orders Placed This Encounter  Procedures  . Medium Joint Inj: R acromioclavicular  . Ambulatory referral to Physical Medicine Rehab   Meds ordered this encounter  Medications  . diclofenac Sodium (VOLTAREN) 1 % GEL    Sig: Apply 2 g topically 4 (four) times daily.    Dispense:  350 g    Refill:  3  . meloxicam (MOBIC) 15 MG tablet    Sig: Take 1 tablet (15 mg total) by mouth daily.    Dispense:  30 tablet    Refill:  2      Procedures: Medium Joint Inj: R acromioclavicular on 08/24/2020 9:52 AM Medications: 3 mL bupivacaine 0.5 %; 13.33 mg methylPREDNISolone acetate 40 MG/ML Aspirate: sent for lab analysis Outcome: tolerated well, no immediate complications      Clinical Data: Findings:  Right shoulder 3 way view from 3 months ago show well maintained right G-H joint without narrowing or spur formation, SAS is 10-15 mm normal. AP radiograph with a spur off the inferior anterior  right acromion, superior subluxation of the right A-C joint with AC joint narrowing and moderated spur formation at the [facility] joint. Deformity of the right distal 1/3 clavicle suspicious for old healed clavicle fracture and chronic AC separation Grade 2 with AC arthrosis.   Cervical spine radiographs show DDD C5-6 with loss of 60% disc height and UV hypertrophy mild anterior disc osteophyte C6-7.    Subjective: Chief Complaint  Patient presents with  . Lower Back - Follow-up    MRI Review    59 year old right male with right shoulder pain and back pain in the lower back and buttocks and he has pain in the buttocks and groin Pain to the point where he has trouble moving. He has pain with coughing and sneezing, no bowel difficulty but notices difficulty with urine Dribbling out when he stands from sitting. Not discussed with urology or primary care about that. There is leg pain in the groin and the inner thighs. Standing or walking increases the pain. Feels better sitting or lying down. Right shoulder with some pain into the fingers right arm and hand. He is not dropping items or loosing grip strength. 15 years ago was getting shots when a fan hit him when a beam fell  when working at [employer] in [employer] he had a right rotator cuff injury and fracture at the time. He did not have surgery because he did not want an operation and did not know it would continue to affect him.    Review of Systems  Constitutional: Positive for chills, fatigue (COVID about 4 months ago.) and fever.  HENT: Negative.   Eyes: Negative.   Respiratory: Negative.   Cardiovascular: Negative.   Gastrointestinal: Negative.   Endocrine: Negative.   Genitourinary: Negative.   Musculoskeletal: Negative.   Skin: Negative.   Allergic/Immunologic: Negative.   Neurological: Negative.   Hematological: Negative.   Psychiatric/Behavioral: Negative.      Objective: Vital Signs: BP (!) 150/90 (BP Location: Left Arm, Patient  Position: Sitting)   Pulse 78   Ht 5\' 8"  (1.727 m)   Wt 163 lb (73.9 kg)   BMI 24.78 kg/m   Physical Exam Constitutional:      Appearance: He is well-developed.  HENT:     Head: Normocephalic and atraumatic.  Eyes:     Pupils: Pupils are equal, round, and reactive to light.  Pulmonary:     Effort: Pulmonary effort is normal.     Breath sounds: Normal breath sounds.  Abdominal:     General: Bowel sounds are normal.     Palpations: Abdomen is soft.  Musculoskeletal:     Cervical back: Normal range of motion and neck supple.     Lumbar back: Negative right straight leg raise test and negative left straight leg raise test.  Skin:    General: Skin is warm and dry.  Neurological:     Mental Status: He is alert and oriented to person, place, and time.  Psychiatric:        Behavior: Behavior normal.        Thought Content: Thought content normal.        Judgment: Judgment normal.     Back Exam   Tenderness  The patient is experiencing tenderness in the cervical.  Range of Motion  Extension: normal  Flexion: normal  Lateral bend right:  70 abnormal  Lateral bend left: normal  Rotation right:  80 abnormal  Rotation left: normal   Muscle Strength  Right Quadriceps:  5/5  Left Quadriceps:  5/5  Right Hamstrings:  5/5  Left Hamstrings:  5/5   Tests  Straight leg raise right: negative Straight leg raise left: negative  Reflexes  Patellar: 2/4 Achilles: 2/4 Biceps: 1/4 Babinski's sign: normal   Other  Toe walk: normal Heel walk: normal Sensation: normal  Comments:  Spurling sign is mild or unremarkable. Trouble with right arm lifting and raising over head.  Weak right shoulder abduction and ER  Tender right AC joint.      Specialty Comments:  No specialty comments available.  Imaging: No results found.   PMFS History: Patient Active Problem List   Diagnosis Date Noted  . Tick bite 05/17/2020  . Anxiety 02/05/2019  . Essential hypertension  12/08/2017  . Type 2 diabetes mellitus without complication, with long-term current use of insulin ([place]) 12/08/2017  . Chest pain 10/04/2017  . ASHD (arteriosclerotic heart disease) 11/15/2015  . Hyperlipidemia 11/15/2015  . Neuropathy 11/15/2015  . Sprain/strain 11/27/2013   Past Medical History:  Diagnosis Date  . Anxiety   . Arthritis    shoulders  . Coronary artery disease   . Diabetes mellitus without complication ([place])   . H/O heart artery stent    x1  .  High cholesterol   . Hypertension     Family History  Problem Relation Age of Onset  . Hypertension Mother   . Diabetes Father   . Colon polyps Neg Hx   . Colon cancer Neg Hx   . Esophageal cancer Neg Hx   . Rectal cancer Neg Hx   . Stomach cancer Neg Hx     Past Surgical History:  Procedure Laterality Date  . COLONOSCOPY     2012- normal   . CORONARY ANGIOPLASTY WITH STENT PLACEMENT     Social History   Occupational History  . Not on file  Tobacco Use  . Smoking status: Former Research scientist (life sciences)  . Smokeless tobacco: Never Used  Substance and Sexual Activity  . Alcohol use: Yes    Comment: occ  . Drug use: Not Currently  . Sexual activity: Not on file

## 2020-08-25 ENCOUNTER — Ambulatory Visit: Payer: Commercial Managed Care - PPO | Admitting: Surgery

## 2020-09-13 ENCOUNTER — Telehealth: Payer: Self-pay | Admitting: Nurse Practitioner

## 2020-09-13 ENCOUNTER — Ambulatory Visit: Payer: Commercial Managed Care - PPO | Attending: Nurse Practitioner | Admitting: Physical Therapy

## 2020-09-13 NOTE — Telephone Encounter (Signed)
Pt was called concerning AWV at [facility]. Lvm to return call

## 2020-09-19 ENCOUNTER — Other Ambulatory Visit: Payer: Self-pay

## 2020-09-19 ENCOUNTER — Other Ambulatory Visit: Payer: Self-pay | Admitting: Specialist

## 2020-09-19 ENCOUNTER — Ambulatory Visit (INDEPENDENT_AMBULATORY_CARE_PROVIDER_SITE_OTHER): Payer: Commercial Managed Care - PPO | Admitting: Specialist

## 2020-09-19 ENCOUNTER — Encounter: Payer: Self-pay | Admitting: Specialist

## 2020-09-19 VITALS — BP 119/76 | HR 73 | Ht 68.0 in | Wt 163.0 lb

## 2020-09-19 DIAGNOSIS — R200 Anesthesia of skin: Secondary | ICD-10-CM

## 2020-09-19 DIAGNOSIS — M25611 Stiffness of right shoulder, not elsewhere classified: Secondary | ICD-10-CM | POA: Diagnosis not present

## 2020-09-19 DIAGNOSIS — M5412 Radiculopathy, cervical region: Secondary | ICD-10-CM | POA: Diagnosis not present

## 2020-09-19 DIAGNOSIS — M7541 Impingement syndrome of right shoulder: Secondary | ICD-10-CM | POA: Diagnosis not present

## 2020-09-19 DIAGNOSIS — R202 Paresthesia of skin: Secondary | ICD-10-CM

## 2020-09-19 DIAGNOSIS — M51379 Other intervertebral disc degeneration, lumbosacral region without mention of lumbar back pain or lower extremity pain: Secondary | ICD-10-CM

## 2020-09-19 DIAGNOSIS — M47816 Spondylosis without myelopathy or radiculopathy, lumbar region: Secondary | ICD-10-CM

## 2020-09-19 DIAGNOSIS — M7501 Adhesive capsulitis of right shoulder: Secondary | ICD-10-CM

## 2020-09-19 DIAGNOSIS — M7521 Bicipital tendinitis, right shoulder: Secondary | ICD-10-CM

## 2020-09-19 DIAGNOSIS — M5137 Other intervertebral disc degeneration, lumbosacral region: Secondary | ICD-10-CM

## 2020-09-19 MED ORDER — MELOXICAM 15 MG PO TABS: 15.0000 mg | ORAL_TABLET | Freq: Every day | ORAL | 1 refills | Status: DC

## 2020-09-19 NOTE — Progress Notes (Signed)
Office Visit Note   Patient: Dean Robertson           Date of Birth: Mar 19, 1961           MRN: [id number] Visit Date: 09/19/2020              Requested by: Dean Francois, NP [address] [place],  [place] [address] PCP: Dean Francois, NP   Assessment & Plan: Visit Diagnoses:  1. Numbness and tingling of right arm   2. Shoulder joint stiffness, right   3. Radiculopathy, cervical region   4. Impingement syndrome of right shoulder   5. Tendonitis of upper biceps tendon of right shoulder   6. Disc disease, degenerative, lumbar or lumbosacral   7. Spondylosis without myelopathy or radiculopathy, lumbar region     Plan: Avoid overhead lifting and overhead use of the arms. Do not lift greater than 5-10 lbs. Adjust head rest in vehicle to prevent hyperextension if rear ended. Take extra precautions to avoid falling. Avoid bending, stooping and avoid lifting weights greater than 10 lbs. Avoid prolong standing and walking. Avoid frequent bending and stooping  No lifting greater than 10 lbs. May use ice or moist heat for pain. Weight loss is of benefit. NSAIDs for lumbar arthritic pain. Physical therapy for right shoulder ROM, stretching and strengthening, lumbar core strengthening,,cervical McKenzie exercises.    Follow-Up Instructions: No follow-ups on file.   Orders:  No orders of the defined types were placed in this encounter.  No orders of the defined types were placed in this encounter.     Procedures: No procedures performed   Clinical Data: No additional findings.   Subjective: Chief Complaint  Patient presents with  . Right Shoulder - Follow-up    Had [facility] joint injection on 08/24/20 without relief  . Neck - Follow-up    HPI  Review of Systems   Objective: Vital Signs: BP 119/76 (BP Location: Left Arm, Patient Position: Sitting)   Pulse 73   Ht 5\' 8"  (1.727 m)   Wt 163 lb (73.9 kg)   BMI 24.78 kg/m   Physical Exam Constitutional:       Appearance: He is well-developed.  HENT:     Head: Normocephalic and atraumatic.  Eyes:     Pupils: Pupils are equal, round, and reactive to light.  Pulmonary:     Effort: Pulmonary effort is normal.     Breath sounds: Normal breath sounds.  Abdominal:     General: Bowel sounds are normal.     Palpations: Abdomen is soft.  Musculoskeletal:     Cervical back: Normal range of motion and neck supple.  Skin:    General: Skin is warm and dry.  Neurological:     Mental Status: He is alert and oriented to person, place, and time.  Psychiatric:        Behavior: Behavior normal.        Thought Content: Thought content normal.        Judgment: Judgment normal.     Back Exam   Range of Motion  Extension: abnormal  Flexion: abnormal    Right Shoulder Exam   Tenderness  The patient is experiencing tenderness in the acromion and biceps tendon.  Range of Motion  Active abduction:  90 abnormal  Passive abduction:  110 abnormal  Extension: 50  External rotation: 90  Forward flexion: 180   Muscle Strength  Abduction: 5/5  Internal rotation: 5/5  External rotation: 5/5  Supraspinatus: 5/5  Subscapularis: 5/5  Biceps: 5/5   Tests  Apprehension: negative Hawkins test: negative Cross arm: negative Impingement: positive Drop arm: negative Sulcus: absent  Other  Erythema: absent Sensation: normal Pulse: present  Comments:  Right biceps tendon with tenderness anterior at the bicipital groove AC joint is prominent. Negaitve tinels at the right elbow.       Specialty Comments:  No specialty comments available.  Imaging: No results found.   PMFS History: Patient Active Problem List   Diagnosis Date Noted  . Tick bite 05/17/2020  . Anxiety 02/05/2019  . Essential hypertension 12/08/2017  . Type 2 diabetes mellitus without complication, with long-term current use of insulin ([place]) 12/08/2017  . Chest pain 10/04/2017  . ASHD (arteriosclerotic heart disease)  11/15/2015  . Hyperlipidemia 11/15/2015  . Neuropathy 11/15/2015  . Sprain/strain 11/27/2013   Past Medical History:  Diagnosis Date  . Anxiety   . Arthritis    shoulders  . Coronary artery disease   . Diabetes mellitus without complication ([place])   . H/O heart artery stent    x1  . High cholesterol   . Hypertension     Family History  Problem Relation Age of Onset  . Hypertension Mother   . Diabetes Father   . Colon polyps Neg Hx   . Colon cancer Neg Hx   . Esophageal cancer Neg Hx   . Rectal cancer Neg Hx   . Stomach cancer Neg Hx     Past Surgical History:  Procedure Laterality Date  . COLONOSCOPY     2012- normal   . CORONARY ANGIOPLASTY WITH STENT PLACEMENT     Social History   Occupational History  . Not on file  Tobacco Use  . Smoking status: Former Research scientist (life sciences)  . Smokeless tobacco: Never Used  Substance and Sexual Activity  . Alcohol use: Yes    Comment: occ  . Drug use: Not Currently  . Sexual activity: Not on file

## 2020-09-19 NOTE — Addendum Note (Signed)
Addended by: Basil Dess on: 09/19/2020 05:08 PM   Modules accepted: Orders

## 2020-09-19 NOTE — Patient Instructions (Addendum)
Plan: Avoid overhead lifting and overhead use of the arms. Do not lift greater than 5-10 lbs. Adjust head rest in vehicle to prevent hyperextension if rear ended. Take extra precautions to avoid falling. Avoid bending, stooping and avoid lifting weights greater than 10 lbs. Avoid prolong standing and walking. Avoid frequent bending and stooping  No lifting greater than 10 lbs. May use ice or moist heat for pain. Weight loss is of benefit. NSAIDs for lumbar arthritic pain. Physical therapy for right shoulder ROM, stretching and strengthening, lumbar core strengthening,,cervical McKenzie exercises. Referral to Dr. Ernestina Patches for right arm EMG/NCV.

## 2020-09-21 ENCOUNTER — Ambulatory Visit: Payer: Commercial Managed Care - PPO | Admitting: Nurse Practitioner

## 2020-09-26 ENCOUNTER — Other Ambulatory Visit: Payer: Self-pay

## 2020-09-26 ENCOUNTER — Emergency Department (HOSPITAL_COMMUNITY)
Admission: EM | Admit: 2020-09-26 | Discharge: 2020-09-27 | Disposition: A | Discharge status: Home / Self Care | Payer: Commercial Managed Care - PPO | Attending: Emergency Medicine | Admitting: Emergency Medicine

## 2020-09-26 ENCOUNTER — Encounter (HOSPITAL_COMMUNITY): Payer: Self-pay | Admitting: Emergency Medicine

## 2020-09-26 DIAGNOSIS — Z955 Presence of coronary angioplasty implant and graft: Secondary | ICD-10-CM | POA: Diagnosis not present

## 2020-09-26 DIAGNOSIS — E785 Hyperlipidemia, unspecified: Secondary | ICD-10-CM | POA: Insufficient documentation

## 2020-09-26 DIAGNOSIS — Z7982 Long term (current) use of aspirin: Secondary | ICD-10-CM | POA: Insufficient documentation

## 2020-09-26 DIAGNOSIS — I10 Essential (primary) hypertension: Secondary | ICD-10-CM | POA: Diagnosis not present

## 2020-09-26 DIAGNOSIS — Z87891 Personal history of nicotine dependence: Secondary | ICD-10-CM | POA: Insufficient documentation

## 2020-09-26 DIAGNOSIS — Z794 Long term (current) use of insulin: Secondary | ICD-10-CM | POA: Diagnosis not present

## 2020-09-26 DIAGNOSIS — E1169 Type 2 diabetes mellitus with other specified complication: Secondary | ICD-10-CM | POA: Diagnosis not present

## 2020-09-26 DIAGNOSIS — I2510 Atherosclerotic heart disease of native coronary artery without angina pectoris: Secondary | ICD-10-CM | POA: Diagnosis not present

## 2020-09-26 DIAGNOSIS — E1165 Type 2 diabetes mellitus with hyperglycemia: Secondary | ICD-10-CM | POA: Diagnosis not present

## 2020-09-26 DIAGNOSIS — E1140 Type 2 diabetes mellitus with diabetic neuropathy, unspecified: Secondary | ICD-10-CM | POA: Diagnosis not present

## 2020-09-26 DIAGNOSIS — Z7984 Long term (current) use of oral hypoglycemic drugs: Secondary | ICD-10-CM | POA: Diagnosis not present

## 2020-09-26 DIAGNOSIS — R739 Hyperglycemia, unspecified: Secondary | ICD-10-CM | POA: Diagnosis present

## 2020-09-26 DIAGNOSIS — Z79899 Other long term (current) drug therapy: Secondary | ICD-10-CM | POA: Insufficient documentation

## 2020-09-26 LAB — COMPREHENSIVE METABOLIC PANEL
ALT: 28 U/L (ref 0–44)
AST: 20 U/L (ref 15–41)
Albumin: 3.5 g/dL (ref 3.5–5.0)
Alkaline Phosphatase: 54 U/L (ref 38–126)
Anion gap: 10 (ref 5–15)
BUN: 25 mg/dL — ABNORMAL HIGH (ref 6–20)
CO2: 23 mmol/L (ref 22–32)
Calcium: 8.7 mg/dL — ABNORMAL LOW (ref 8.9–10.3)
Chloride: 100 mmol/L (ref 98–111)
Creatinine, Ser: 1.07 mg/dL (ref 0.61–1.24)
GFR, Estimated: >60 mL/min (ref >60)
Glucose, Bld: 314 mg/dL — ABNORMAL HIGH (ref 70–99)
Potassium: 4.1 mmol/L (ref 3.5–5.1)
Sodium: 133 mmol/L — ABNORMAL LOW (ref 135–145)
Total Bilirubin: 0.9 mg/dL (ref 0.3–1.2)
Total Protein: 6 g/dL — ABNORMAL LOW (ref 6.5–8.1)

## 2020-09-26 LAB — CBC WITH DIFFERENTIAL/PLATELET
Abs Immature Granulocytes: 0.01 10*3/uL (ref 0.00–0.07)
Basophils Absolute: 0.1 10*3/uL (ref 0.0–0.1)
Basophils Relative: 2 %
Eosinophils Absolute: 0.1 10*3/uL (ref 0.0–0.5)
Eosinophils Relative: 3 %
HCT: 42.6 % (ref 39.0–52.0)
Hemoglobin: 14 g/dL (ref 13.0–17.0)
Immature Granulocytes: 0 %
Lymphocytes Relative: 52 %
Lymphs Abs: 1.7 10*3/uL (ref 0.7–4.0)
MCH: 29.8 pg (ref 26.0–34.0)
MCHC: 32.9 g/dL (ref 30.0–36.0)
MCV: 90.6 fL (ref 80.0–100.0)
Monocytes Absolute: 0.3 10*3/uL (ref 0.1–1.0)
Monocytes Relative: 10 %
Neutro Abs: 1.1 10*3/uL — ABNORMAL LOW (ref 1.7–7.7)
Neutrophils Relative %: 33 %
Platelets: 258 10*3/uL (ref 150–400)
RBC: 4.7 MIL/uL (ref 4.22–5.81)
RDW: 11.5 % (ref 11.5–15.5)
WBC: 3.3 10*3/uL — ABNORMAL LOW (ref 4.0–10.5)
nRBC: 0 % (ref 0.0–0.2)

## 2020-09-26 NOTE — ED Triage Notes (Signed)
Patient arrived with EMS from home reports hyperglycemia this evening CBG=300+with emesis and generalized weakness/fatigue , he received Zofran 4 mg IV by EMS with relief . He did not take his insulin today .

## 2020-09-27 LAB — URINALYSIS, ROUTINE W REFLEX MICROSCOPIC
Bacteria, UA: NONE SEEN
Bilirubin Urine: NEGATIVE
Glucose, UA: >=500 mg/dL — AB
Hgb urine dipstick: NEGATIVE
Ketones, ur: NEGATIVE mg/dL
Leukocytes,Ua: NEGATIVE
Nitrite: NEGATIVE
Protein, ur: NEGATIVE mg/dL
Specific Gravity, Urine: 1.032 — ABNORMAL HIGH (ref 1.005–1.030)
pH: 5 (ref 5.0–8.0)

## 2020-09-27 LAB — CBG MONITORING, ED: Glucose-Capillary: 322 mg/dL — ABNORMAL HIGH (ref 70–99)

## 2020-09-27 NOTE — ED Provider Notes (Signed)
[facility] EMERGENCY DEPARTMENT Provider Note   CSN: [id number] Arrival date & time: 09/26/20  2042     History Chief Complaint  Patient presents with  . Hyperglycemia    Dean Robertson is a 59 y.o. male.  Patient presents to the emergency department with a chief complaint of hyperglycemia.  He states that his sugar was running high earlier today.  He had not taken his insulin last night.  He reports that tonight while he was getting ready to eat dinner he became nauseated, had vomiting, and generalized weakness.  He states that he thinks his blood sugar was very high at that point in time.  He now states that he feels back to normal, and does not have any complaints currently other than being hungry.  He denies any recent illnesses.  Denies any other associated symptoms.  The history is provided by the patient. No language interpreter was used.       Past Medical History:  Diagnosis Date  . Anxiety   . Arthritis    shoulders  . Coronary artery disease   . Diabetes mellitus without complication ([place])   . H/O heart artery stent    x1  . High cholesterol   . Hypertension     Patient Active Problem List   Diagnosis Date Noted  . Tick bite 05/17/2020  . Anxiety 02/05/2019  . Essential hypertension 12/08/2017  . Type 2 diabetes mellitus without complication, with long-term current use of insulin ([place]) 12/08/2017  . Chest pain 10/04/2017  . ASHD (arteriosclerotic heart disease) 11/15/2015  . Hyperlipidemia 11/15/2015  . Neuropathy 11/15/2015  . Sprain/strain 11/27/2013    Past Surgical History:  Procedure Laterality Date  . COLONOSCOPY     2012- normal   . CORONARY ANGIOPLASTY WITH STENT PLACEMENT         Family History  Problem Relation Age of Onset  . Hypertension Mother   . Diabetes Father   . Colon polyps Neg Hx   . Colon cancer Neg Hx   . Esophageal cancer Neg Hx   . Rectal cancer Neg Hx   . Stomach cancer Neg Hx     Social  History   Tobacco Use  . Smoking status: Former Research scientist (life sciences)  . Smokeless tobacco: Never Used  Substance Use Topics  . Alcohol use: Yes    Comment: occ  . Drug use: Not Currently    Home Medications Prior to Admission medications   Medication Sig Start Date End Date Taking? Authorizing Provider  acetaminophen (TYLENOL) 500 MG tablet Take 1 tablet (500 mg total) by mouth every 6 (six) hours as needed. 12/08/17   Dorena Dew, FNP  aspirin EC 81 MG tablet Take 81 mg by mouth daily.    [provider]  benzonatate (TESSALON) 100 MG capsule Take 1 capsule (100 mg total) by mouth every 8 (eight) hours. 09/17/19   Nuala Alpha A, PA-C  Blood Glucose Monitoring Suppl (TRUE METRIX AIR GLUCOSE METER) w/Device KIT 1 each 3 (three) times daily by Does not apply route. 09/19/17   Dorena Dew, FNP  busPIRone (BUSPAR) 10 MG tablet Take 1 tablet (10 mg total) by mouth 2 (two) times daily. 05/06/20   Azzie Glatter, FNP  diclofenac Sodium (VOLTAREN) 1 % GEL Apply 2 g topically 4 (four) times daily. 08/24/20   Jessy Oto, MD  gabapentin (NEURONTIN) 300 MG capsule TAKE 1 CAPSULE(300 MG) BY MOUTH THREE TIMES DAILY 05/06/20   Azzie Glatter,  FNP  glucose blood (TRUE METRIX BLOOD GLUCOSE TEST) test strip Use as instructed 05/19/18   Lanae Boast, FNP  insulin glargine (LANTUS SOLOSTAR) 100 UNIT/ML Solostar Pen Inject 50 Units into the skin at bedtime. 05/06/20   Azzie Glatter, FNP  Lancets MISC 1 each 3 (three) times daily by Does not apply route. 09/19/17   Dorena Dew, FNP  lisinopril (ZESTRIL) 20 MG tablet TAKE 1 TABLET(20 MG) BY MOUTH DAILY 05/06/20   Azzie Glatter, FNP  meloxicam (MOBIC) 15 MG tablet Take 1 tablet (15 mg total) by mouth daily. 08/24/20 08/24/21  Jessy Oto, MD  meloxicam (MOBIC) 15 MG tablet TAKE 1 TABLET(15 MG) BY MOUTH DAILY 09/20/20   Jessy Oto, MD  Menthol, Topical Analgesic, (BIOFREEZE) 4 % GEL Apply 1 application topically 4 (four) times  daily as needed. 05/17/20   Azzie Glatter, FNP  metFORMIN (GLUCOPHAGE) 1000 MG tablet Take 1 tablet (1,000 mg total) by mouth 2 (two) times daily with a meal. 05/06/20   Azzie Glatter, FNP  methocarbamol (ROBAXIN) 500 MG tablet Take 1 tablet (500 mg total) by mouth 2 (two) times daily as needed for muscle spasms. 05/06/20   Azzie Glatter, FNP  Multiple Vitamins-Minerals (VITRUM SENIOR) TABS Take by mouth.    [provider]  naproxen (NAPROSYN) 375 MG tablet Take 1 tablet (375 mg total) by mouth 2 (two) times daily with a meal. 04/11/20   Caccavale, Sophia, PA-C  propranolol (INDERAL) 40 MG tablet Take 1 tablet (40 mg total) by mouth 2 (two) times daily. As needed for anxiety 05/17/20   Azzie Glatter, FNP  simvastatin (ZOCOR) 20 MG tablet Take 1 tablet (20 mg total) by mouth daily at 6 PM. 05/06/20   Azzie Glatter, FNP  traMADol (ULTRAM) 50 MG tablet TAKE 1 TABLET(50 MG) BY MOUTH EVERY 8 HOURS FOR UP TO 5 DAYS AS NEEDED 10/12/19   Dorena Dew, FNP    Allergies    Patient has no known allergies.  Review of Systems   Review of Systems  All other systems reviewed and are negative.   Physical Exam Updated Vital Signs BP 122/85 (BP Location: Right Arm)   Pulse 67   Temp (!) 97.5 F (36.4 C) (Oral)   Resp 18   Ht 5' 8"  (1.727 m)   Wt 82 kg   SpO2 100%   BMI 27.49 kg/m   Physical Exam Vitals and nursing note reviewed.  Constitutional:      Appearance: He is well-developed.  HENT:     Head: Normocephalic and atraumatic.  Eyes:     Conjunctiva/sclera: Conjunctivae normal.  Cardiovascular:     Rate and Rhythm: Normal rate and regular rhythm.     Heart sounds: No murmur heard.   Pulmonary:     Effort: Pulmonary effort is normal. No respiratory distress.     Breath sounds: Normal breath sounds.  Abdominal:     Palpations: Abdomen is soft.     Tenderness: There is no abdominal tenderness.  Musculoskeletal:        General: Normal range of motion.      Cervical back: Neck supple.  Skin:    General: Skin is warm and dry.  Neurological:     Mental Status: He is alert and oriented to person, place, and time.  Psychiatric:        Mood and Affect: Mood normal.        Behavior: Behavior normal.  ED Results / Procedures / Treatments   Labs (all labs ordered are listed, but only abnormal results are displayed) Labs Reviewed  CBC WITH DIFFERENTIAL/PLATELET - Abnormal; Notable for the following components:      Result Value   WBC 3.3 (*)    Neutro Abs 1.1 (*)    All other components within normal limits  COMPREHENSIVE METABOLIC PANEL - Abnormal; Notable for the following components:   Sodium 133 (*)    Glucose, Bld 314 (*)    BUN 25 (*)    Calcium 8.7 (*)    Total Protein 6.0 (*)    All other components within normal limits  URINALYSIS, ROUTINE W REFLEX MICROSCOPIC - Abnormal; Notable for the following components:   Specific Gravity, Urine 1.032 (*)    Glucose, UA >=500 (*)    All other components within normal limits  CBG MONITORING, ED - Abnormal; Notable for the following components:   Glucose-Capillary 322 (*)    All other components within normal limits    EKG None  Radiology No results found.  Procedures Procedures (including critical care time)  Medications Ordered in ED Medications - No data to display  ED Course  I have reviewed the triage vital signs and the nursing notes.  Pertinent labs & imaging results that were available during my care of the patient were reviewed by me and considered in my medical decision making (see chart for details).    MDM Rules/Calculators/A&P                         Patient here with hyperglycemia.  He states that he had an episode of nausea and vomiting with generalized weakness earlier tonight that he attributes to elevated blood glucose.  His CBG is 322.  He is no longer having any symptoms whatsoever.  He did not take his insulin last night.  I have advised the patient to  continue with his regular medications.  At this time, feel that patient has been adequately screened for emergent condition, I do not think he needs any additional work-up, and can be safely discharged at this time. Final Clinical Impression(s) / ED Diagnoses Final diagnoses:  Hyperglycemia    Rx / DC Orders ED Discharge Orders    None       Montine Circle, PA-C 09/27/20 [id number]    Mesner, Corene Cornea, MD 09/27/20 [phone]

## 2020-09-27 NOTE — ED Notes (Signed)
Discharge instructions provided to patient. Verbalized understanding. Alert and oriented. IV lock removed. Ambulated with steady gait out of ED.

## 2020-10-14 ENCOUNTER — Encounter: Payer: Commercial Managed Care - PPO | Admitting: Physical Medicine and Rehabilitation

## 2020-10-27 ENCOUNTER — Ambulatory Visit (INDEPENDENT_AMBULATORY_CARE_PROVIDER_SITE_OTHER): Payer: Commercial Managed Care - PPO | Admitting: Nurse Practitioner

## 2020-10-27 ENCOUNTER — Other Ambulatory Visit: Payer: Self-pay

## 2020-10-27 ENCOUNTER — Encounter: Payer: Self-pay | Admitting: Nurse Practitioner

## 2020-10-27 VITALS — BP 151/91 | HR 78 | Temp 97.2°F | Resp 18 | Ht 67.0 in | Wt 165.4 lb

## 2020-10-27 DIAGNOSIS — I10 Essential (primary) hypertension: Secondary | ICD-10-CM

## 2020-10-27 DIAGNOSIS — E7849 Other hyperlipidemia: Secondary | ICD-10-CM

## 2020-10-27 DIAGNOSIS — Z23 Encounter for immunization: Secondary | ICD-10-CM

## 2020-10-27 DIAGNOSIS — E1165 Type 2 diabetes mellitus with hyperglycemia: Secondary | ICD-10-CM | POA: Diagnosis not present

## 2020-10-27 DIAGNOSIS — F419 Anxiety disorder, unspecified: Secondary | ICD-10-CM

## 2020-10-27 LAB — POCT URINALYSIS DIP (CLINITEK)
Bilirubin, UA: NEGATIVE
Blood, UA: NEGATIVE
Glucose, UA: 500 mg/dL — AB
Ketones, POC UA: NEGATIVE mg/dL
Leukocytes, UA: NEGATIVE
Nitrite, UA: NEGATIVE
POC PROTEIN,UA: NEGATIVE
Spec Grav, UA: >=1.03 — AB (ref 1.010–1.025)
Urobilinogen, UA: 1 E.U./dL
pH, UA: 6 (ref 5.0–8.0)

## 2020-10-27 LAB — POCT GLYCOSYLATED HEMOGLOBIN (HGB A1C): Hemoglobin A1C: 11.7 % — AB (ref 4.0–5.6)

## 2020-10-27 LAB — POCT CBG (FASTING - GLUCOSE)-MANUAL ENTRY: Glucose Fasting, POC: 222 mg/dL — AB (ref 70–99)

## 2020-10-27 MED ORDER — METFORMIN HCL 1000 MG PO TABS: 1000.0000 mg | ORAL_TABLET | Freq: Two times a day (BID) | ORAL | 3 refills | Status: DC

## 2020-10-27 MED ORDER — PROPRANOLOL HCL 40 MG PO TABS: 40.0000 mg | ORAL_TABLET | Freq: Two times a day (BID) | ORAL | 6 refills | Status: AC

## 2020-10-27 MED ORDER — BUSPIRONE HCL 10 MG PO TABS: 10.0000 mg | ORAL_TABLET | Freq: Two times a day (BID) | ORAL | 3 refills | Status: AC

## 2020-10-27 MED ORDER — TRUE METRIX BLOOD GLUCOSE TEST VI STRP: ORAL_STRIP | 12 refills | Status: DC

## 2020-10-27 MED ORDER — CYCLOBENZAPRINE HCL 5 MG PO TABS: 5.0000 mg | ORAL_TABLET | Freq: Three times a day (TID) | ORAL | 1 refills | Status: AC | PRN

## 2020-10-27 MED ORDER — LISINOPRIL 20 MG PO TABS: ORAL_TABLET | ORAL | 3 refills | Status: AC

## 2020-10-27 MED ORDER — TRUE METRIX AIR GLUCOSE METER W/DEVICE KIT: 1.0000 | PACK | Freq: Three times a day (TID) | 0 refills | Status: DC

## 2020-10-27 MED ORDER — LANTUS SOLOSTAR 100 UNIT/ML ~~LOC~~ SOPN: PEN_INJECTOR | SUBCUTANEOUS | 0 refills | Status: DC

## 2020-10-27 MED ORDER — SIMVASTATIN 20 MG PO TABS: 20.0000 mg | ORAL_TABLET | Freq: Every day | ORAL | 3 refills | Status: DC

## 2020-10-27 NOTE — Progress Notes (Signed)
Needs new glucometer   Flu vaccine today

## 2020-10-27 NOTE — Patient Instructions (Addendum)

## 2020-10-27 NOTE — Progress Notes (Signed)
Established Patient Office Visit  Subjective:  Patient ID: Dean Robertson, male    DOB: 04-15-1961  Age: 59 y.o. MRN: [id number]  CC:  Chief Complaint  Patient presents with  . Follow-up    HPI Dean Robertson presents for follow up. He  has a past medical history of Anxiety, Arthritis, Coronary artery disease, Diabetes mellitus without complication ([place]), H/O heart artery stent, High cholesterol, and Hypertension.   Diabetes Mellitus Patient presents for follow up of diabetes. This my first time assessing his diabetes in office. I did speak with him over the phone concerning his diabetes for lab results. Current symptoms include: hyperglycemia and visual disturbances. Symptoms have stabilized. Patient denies foot ulcerations, hypoglycemia , increased appetite, nausea, polydipsia, polyuria, vomiting and weight loss. Evaluation to date has included: fasting blood sugar, fasting lipid panel, hemoglobin A1C and microalbuminuria.  Home sugars: BGs are high in the morning, BGs are high in the evening, BGs are high around dinner, BGs are high at bedtime. Current treatment: Continued insulin which has been not very effective, Continued metformin which has been not very effective, Continued statin which has been somewhat effective and Continued ACE inhibitor/ARB which has been somewhat effective. Last dilated eye exam: *unknown.  Hypertension Patient is here for follow-up of elevated blood pressure. He is not exercising and is adherent to a low-salt diet. Blood pressure is not monitored at home. Cardiac symptoms: none. Patient denies chest pain, dyspnea, exertional chest pressure/discomfort, fatigue, irregular heart beat, lower extremity edema, palpitations and syncope. Cardiovascular risk factors: advanced age (older than 76 for men, 79 for women), diabetes mellitus, dyslipidemia, hypertension, male gender and sedentary lifestyle. Use of agents associated with hypertension: none. History of target organ  damage: angina/ prior myocardial infarction. He continues to complain about back pain. He is being followed by ortho with no current resolution of symptoms.   Past Medical History:  Diagnosis Date  . Anxiety   . Arthritis    shoulders  . Coronary artery disease   . Diabetes mellitus without complication ([place])   . H/O heart artery stent    x1  . High cholesterol   . Hypertension     Past Surgical History:  Procedure Laterality Date  . COLONOSCOPY     2012- normal   . CORONARY ANGIOPLASTY WITH STENT PLACEMENT      Family History  Problem Relation Age of Onset  . Hypertension Mother   . Diabetes Father   . Colon polyps Neg Hx   . Colon cancer Neg Hx   . Esophageal cancer Neg Hx   . Rectal cancer Neg Hx   . Stomach cancer Neg Hx     Social History   Socioeconomic History  . Marital status: Legally Separated    Spouse name: Not on file  . Number of children: Not on file  . Years of education: Not on file  . Highest education level: Not on file  Occupational History  . Not on file  Tobacco Use  . Smoking status: Former Research scientist (life sciences)  . Smokeless tobacco: Never Used  Substance and Sexual Activity  . Alcohol use: Yes    Comment: occ  . Drug use: Not Currently  . Sexual activity: Not on file  Other Topics Concern  . Not on file  Social History Narrative  . Not on file   Social Determinants of Health   Financial Resource Strain: Not on file  Food Insecurity: Not on file  Transportation Needs: Not on file  Physical Activity:  Not on file  Stress: Not on file  Social Connections: Not on file  Intimate Partner Violence: Not on file    Outpatient Medications Prior to Visit  Medication Sig Dispense Refill  . acetaminophen (TYLENOL) 500 MG tablet Take 1 tablet (500 mg total) by mouth every 6 (six) hours as needed. 30 tablet 0  . aspirin EC 81 MG tablet Take 81 mg by mouth daily.    . diclofenac Sodium (VOLTAREN) 1 % GEL Apply 2 g topically 4 (four) times daily. 350 g 3   . gabapentin (NEURONTIN) 300 MG capsule TAKE 1 CAPSULE(300 MG) BY MOUTH THREE TIMES DAILY 270 capsule 3  . Lancets MISC 1 each 3 (three) times daily by Does not apply route. 100 each 11  . meloxicam (MOBIC) 15 MG tablet TAKE 1 TABLET(15 MG) BY MOUTH DAILY 90 tablet 4  . Menthol, Topical Analgesic, (BIOFREEZE) 4 % GEL Apply 1 application topically 4 (four) times daily as needed. 89 mL 3  . Multiple Vitamins-Minerals (VITRUM SENIOR) TABS Take by mouth.    . naproxen (NAPROSYN) 375 MG tablet Take 1 tablet (375 mg total) by mouth 2 (two) times daily with a meal. 20 tablet 0  . Blood Glucose Monitoring Suppl (TRUE METRIX AIR GLUCOSE METER) w/Device KIT 1 each 3 (three) times daily by Does not apply route. 1 kit 0  . busPIRone (BUSPAR) 10 MG tablet Take 1 tablet (10 mg total) by mouth 2 (two) times daily. 60 tablet 3  . glucose blood (TRUE METRIX BLOOD GLUCOSE TEST) test strip Use as instructed 100 each 12  . insulin glargine (LANTUS SOLOSTAR) 100 UNIT/ML Solostar Pen Inject 50 Units into the skin at bedtime. 15 mL 3  . lisinopril (ZESTRIL) 20 MG tablet TAKE 1 TABLET(20 MG) BY MOUTH DAILY 90 tablet 3  . meloxicam (MOBIC) 15 MG tablet Take 1 tablet (15 mg total) by mouth daily. 30 tablet 2  . metFORMIN (GLUCOPHAGE) 1000 MG tablet Take 1 tablet (1,000 mg total) by mouth 2 (two) times daily with a meal. 180 tablet 3  . propranolol (INDERAL) 40 MG tablet Take 1 tablet (40 mg total) by mouth 2 (two) times daily. As needed for anxiety 60 tablet 6  . simvastatin (ZOCOR) 20 MG tablet Take 1 tablet (20 mg total) by mouth daily at 6 PM. 90 tablet 3  . benzonatate (TESSALON) 100 MG capsule Take 1 capsule (100 mg total) by mouth every 8 (eight) hours. (Patient not taking: Reported on 10/27/2020) 21 capsule 0  . methocarbamol (ROBAXIN) 500 MG tablet Take 1 tablet (500 mg total) by mouth 2 (two) times daily as needed for muscle spasms. (Patient not taking: Reported on 10/27/2020) 60 tablet 3  . traMADol (ULTRAM) 50  MG tablet TAKE 1 TABLET(50 MG) BY MOUTH EVERY 8 HOURS FOR UP TO 5 DAYS AS NEEDED 10 tablet 0   Facility-Administered Medications Prior to Visit  Medication Dose Route Frequency Provider Last Rate Last Admin  . 0.9 %  sodium chloride infusion  500 mL Intravenous Once Cirigliano, Vito V, DO        Allergies  Allergen Reactions  . Tramadol Nausea And Vomiting    ROS Review of Systems  Respiratory: Positive for cough (resolving).   Musculoskeletal: Positive for back pain and myalgias.      Objective:    Physical Exam Constitutional:      Appearance: He is normal weight.  HENT:     Head: Normocephalic and atraumatic.     Nose:  Nose normal.     Mouth/Throat:     Mouth: Mucous membranes are moist.  Cardiovascular:     Rate and Rhythm: Normal rate and regular rhythm.     Pulses: Normal pulses.     Heart sounds: Normal heart sounds.  Pulmonary:     Effort: Pulmonary effort is normal.     Breath sounds: Normal breath sounds.  Abdominal:     General: Bowel sounds are normal.     Palpations: Abdomen is soft.  Musculoskeletal:        General: Tenderness present.     Cervical back: Normal range of motion.  Skin:    General: Skin is warm and dry.     Capillary Refill: Capillary refill takes less than 2 seconds.  Neurological:     General: No focal deficit present.     Mental Status: He is alert and oriented to person, place, and time.  Psychiatric:        Mood and Affect: Mood normal.        Behavior: Behavior normal.        Thought Content: Thought content normal.        Judgment: Judgment normal.     BP (!) 151/91 (BP Location: Left Arm, Patient Position: Sitting, Cuff Size: Normal) Comment: has not taken med this morning  Pulse 78   Temp (!) 97.2 F (36.2 C) (Temporal)   Resp 18   Ht 5' 7"  (1.702 m)   Wt 165 lb 6.4 oz (75 kg)   SpO2 100%   BMI 25.91 kg/m  Wt Readings from Last 3 Encounters:  10/27/20 165 lb 6.4 oz (75 kg)  09/26/20 180 lb 12.4 oz (82 kg)   09/19/20 163 lb (73.9 kg)     There are no preventive care reminders to display for this patient.  There are no preventive care reminders to display for this patient.  Lab Results  Component Value Date   TSH 0.605 05/06/2020   Lab Results  Component Value Date   WBC 3.3 (L) 09/26/2020   HGB 14.0 09/26/2020   HCT 42.6 09/26/2020   MCV 90.6 09/26/2020   PLT 258 09/26/2020   Lab Results  Component Value Date   NA 133 (L) 09/26/2020   K 4.1 09/26/2020   CO2 23 09/26/2020   GLUCOSE 314 (H) 09/26/2020   BUN 25 (H) 09/26/2020   CREATININE 1.07 09/26/2020   BILITOT 0.9 09/26/2020   ALKPHOS 54 09/26/2020   AST 20 09/26/2020   ALT 28 09/26/2020   PROT 6.0 (L) 09/26/2020   ALBUMIN 3.5 09/26/2020   CALCIUM 8.7 (L) 09/26/2020   ANIONGAP 10 09/26/2020   Lab Results  Component Value Date   CHOL 220 (H) 05/06/2020   Lab Results  Component Value Date   HDL 82 05/06/2020   Lab Results  Component Value Date   LDLCALC 123 (H) 05/06/2020   Lab Results  Component Value Date   TRIG 86 05/06/2020   Lab Results  Component Value Date   CHOLHDL 2.7 05/06/2020   Lab Results  Component Value Date   HGBA1C 11.7 (A) 10/27/2020      Assessment & Plan:   Problem List Items Addressed This Visit      Cardiovascular and Mediastinum   Essential hypertension Encouraged on going compliance with current medication regimen Encouraged using the propanolol Encouraged home monitoring and recording BP <130/80 Eating a heart-healthy diet with less salt Encouraged regular physical activity  Recommend Weight loss  Relevant Medications   propranolol (INDERAL) 40 MG tablet   simvastatin (ZOCOR) 20 MG tablet   lisinopril (ZESTRIL) 20 MG tablet     Other   Anxiety   Relevant Medications   busPIRone (BUSPAR) 10 MG tablet   Hyperlipidemia   Relevant Medications   propranolol (INDERAL) 40 MG tablet   simvastatin (ZOCOR) 20 MG tablet   lisinopril (ZESTRIL) 20 MG tablet    Other  Visit Diagnoses    Uncontrolled type 2 diabetes mellitus with hyperglycemia ([facility])    -  Primary Encourage compliance with current treatment regimen  Dose adjustment Increased Lantus on a tapering dose over the next 3 weeks Encourage regular CBG monitoring Encourage contacting office if excessive hyperglycemia and or hypoglycemia Lifestyle modification with healthy diet (fewer calories, more high fiber foods, whole grains and non-starchy vegetables, lower fat meat and fish, low-fat diary include healthy oils) regular exercise (physical activity) and weight loss Opthalmology exam discussed  Nutritional consult placed Regular dental visits encouraged Home BP monitoring also encouraged goal <130/80     Relevant Medications   Blood Glucose Monitoring Suppl (TRUE METRIX AIR GLUCOSE METER) w/Device KIT   glucose blood (TRUE METRIX BLOOD GLUCOSE TEST) test strip   insulin glargine (LANTUS SOLOSTAR) 100 UNIT/ML Solostar Pen   metFORMIN (GLUCOPHAGE) 1000 MG tablet   simvastatin (ZOCOR) 20 MG tablet   lisinopril (ZESTRIL) 20 MG tablet   Other Relevant Orders   HgB A1c (Completed)   Glucose (CBG), Fasting (Completed)   POCT URINALYSIS DIP (CLINITEK) (Completed)   Ambulatory referral to diabetic education   Flu vaccine need       Relevant Orders   Flu Vaccine QUAD 6+ mos PF IM (Fluarix Quad PF) (Completed)      Meds ordered this encounter  Medications  . Blood Glucose Monitoring Suppl (TRUE METRIX AIR GLUCOSE METER) w/Device KIT    Sig: 1 each by Does not apply route 3 (three) times daily.    Dispense:  1 kit    Refill:  0    Order Specific Question:   Supervising Provider    Answer:   Tresa Garter [id number]  . glucose blood (TRUE METRIX BLOOD GLUCOSE TEST) test strip    Sig: Use as instructed    Dispense:  100 each    Refill:  12    Order Specific Question:   Supervising Provider    Answer:   Tresa Garter [[id number]]  . insulin glargine (LANTUS SOLOSTAR) 100 UNIT/ML  Solostar Pen    Sig: Inject 30 Units into the skin 2 (two) times daily for 7 days, THEN 40 Units 2 (two) times daily for 7 days, THEN 50 Units 2 (two) times daily for 7 days.    Dispense:  16.8 mL    Refill:  0    Order Specific Question:   Supervising Provider    Answer:   Tresa Garter [id number]  . metFORMIN (GLUCOPHAGE) 1000 MG tablet    Sig: Take 1 tablet (1,000 mg total) by mouth 2 (two) times daily with a meal.    Dispense:  180 tablet    Refill:  3    Order Specific Question:   Supervising Provider    Answer:   Tresa Garter [id number]  . propranolol (INDERAL) 40 MG tablet    Sig: Take 1 tablet (40 mg total) by mouth 2 (two) times daily. As needed for anxiety    Dispense:  60 tablet    Refill:  6  Order Specific Question:   Supervising Provider    Answer:   Tresa Garter [id number]  . simvastatin (ZOCOR) 20 MG tablet    Sig: Take 1 tablet (20 mg total) by mouth daily at 6 PM.    Dispense:  90 tablet    Refill:  3    Order Specific Question:   Supervising Provider    Answer:   Tresa Garter [id number]  . lisinopril (ZESTRIL) 20 MG tablet    Sig: TAKE 1 TABLET(20 MG) BY MOUTH DAILY    Dispense:  90 tablet    Refill:  3    Order Specific Question:   Supervising Provider    Answer:   Tresa Garter [id number]  . busPIRone (BUSPAR) 10 MG tablet    Sig: Take 1 tablet (10 mg total) by mouth 2 (two) times daily.    Dispense:  60 tablet    Refill:  3    Order Specific Question:   Supervising Provider    Answer:   Tresa Garter [id number]  . cyclobenzaprine (FLEXERIL) 5 MG tablet    Sig: Take 1 tablet (5 mg total) by mouth 3 (three) times daily as needed for muscle spasms.    Dispense:  30 tablet    Refill:  1    Order Specific Question:   Supervising Provider    Answer:   Tresa Garter [id number]    Follow-up: Return in about 2 weeks (around 11/10/2020).    Vevelyn Francois, NP

## 2020-11-07 ENCOUNTER — Other Ambulatory Visit: Payer: Commercial Managed Care - PPO

## 2020-11-09 ENCOUNTER — Ambulatory Visit: Payer: Commercial Managed Care - PPO | Admitting: Dietician

## 2020-11-10 ENCOUNTER — Ambulatory Visit (INDEPENDENT_AMBULATORY_CARE_PROVIDER_SITE_OTHER): Payer: Commercial Managed Care - PPO | Admitting: Nurse Practitioner

## 2020-11-10 ENCOUNTER — Encounter: Payer: Self-pay | Admitting: Nurse Practitioner

## 2020-11-10 ENCOUNTER — Other Ambulatory Visit: Payer: Self-pay

## 2020-11-10 VITALS — BP 130/90 | HR 80 | Temp 97.7°F | Ht 67.0 in | Wt 165.8 lb

## 2020-11-10 DIAGNOSIS — N529 Male erectile dysfunction, unspecified: Secondary | ICD-10-CM

## 2020-11-10 DIAGNOSIS — E1165 Type 2 diabetes mellitus with hyperglycemia: Secondary | ICD-10-CM | POA: Diagnosis not present

## 2020-11-10 DIAGNOSIS — I10 Essential (primary) hypertension: Secondary | ICD-10-CM | POA: Diagnosis not present

## 2020-11-10 MED ORDER — SILDENAFIL CITRATE 25 MG PO TABS: 25.0000 mg | ORAL_TABLET | Freq: Every day | ORAL | 0 refills | Status: DC | PRN

## 2020-11-10 NOTE — Progress Notes (Signed)
[place], [place]  [address] Phone:  [phone]   Fax:  [phone]    Acute Office Visit  Subjective:    Patient ID: Dean Robertson, male    DOB: [birth year], 60 y.o.   MRN: [id number]  Chief Complaint  Patient presents with   Follow-up    Follow 2  week s for dm , pt said that  is doing well , and mange it his diabetes, having lower back and thigh. Taking pain medication sometimes help help w/ pain, started about 8 months ago.     HPI Patient is in today for follow up. He  has a past medical history of Anxiety, Arthritis, Coronary artery disease, Diabetes mellitus without complication ([place]), H/O heart artery stent, High cholesterol, and Hypertension.   Diabetes Mellitus Patient presents for follow up of diabetes. Current symptoms include: hyperglycemia. Symptoms have stabilized. Patient denies hypoglycemia , nausea, polydipsia, polyuria, visual disturbances, vomiting and weight loss. Evaluation to date has included: fasting blood sugar, fasting lipid panel and hemoglobin A1C.  Home sugars: BGs have been labile ranging between 97 and 210. Current treatment: more intensive attention to diet which has been somewhat effective, Continued insulin which has been somewhat effective, Continued statin which has been somewhat effective and Continued ACE inhibitor/ARB which has been somewhat effective. Last dilated eye exam: 2021.  He would also like for evaluation and treatment of erectile dysfunction. Onset of dysfunction was 2 months ago and onset was gradual. Patient states he has difficulty maintaining erection. Full erections occur with intercourse. Partial erections occur with intercourse. Libido is not affected. Risk factors for ED include beta blocker use and diabetes mellitus. Patient denies history of diabetes mellitus. Patient's expectations of sexual function normal. Patient describes relationship with partner has change.   Past Medical  History:  Diagnosis Date   Anxiety    Arthritis    shoulders   Coronary artery disease    Diabetes mellitus without complication ([place])    H/O heart artery stent    x1   High cholesterol    Hypertension     Past Surgical History:  Procedure Laterality Date   COLONOSCOPY     2012- normal    CORONARY ANGIOPLASTY WITH STENT PLACEMENT      Family History  Problem Relation Age of Onset   Hypertension Mother    Diabetes Father    Colon polyps Neg Hx    Colon cancer Neg Hx    Esophageal cancer Neg Hx    Rectal cancer Neg Hx    Stomach cancer Neg Hx     Social History   Socioeconomic History   Marital status: Legally Separated    Spouse name: Not on file   Number of children: Not on file   Years of education: Not on file   Highest education level: Not on file  Occupational History   Not on file  Tobacco Use   Smoking status: Former Smoker   Smokeless tobacco: Never Used  Substance and Sexual Activity   Alcohol use: Yes    Comment: occ   Drug use: Not Currently   Sexual activity: Not on file  Other Topics Concern   Not on file  Social History Narrative   Not on file   Social Determinants of Health   Financial Resource Strain: Not on file  Food Insecurity: Not on file  Transportation Needs: Not on file  Physical Activity: Not on file  Stress: Not  on file  Social Connections: Not on file  Intimate Partner Violence: Not on file    Outpatient Medications Prior to Visit  Medication Sig Dispense Refill   acetaminophen (TYLENOL) 500 MG tablet Take 1 tablet (500 mg total) by mouth every 6 (six) hours as needed. 30 tablet 0   aspirin EC 81 MG tablet Take 81 mg by mouth daily.     Blood Glucose Monitoring Suppl (TRUE METRIX AIR GLUCOSE METER) w/Device KIT 1 each by Does not apply route 3 (three) times daily. 1 kit 0   busPIRone (BUSPAR) 10 MG tablet Take 1 tablet (10 mg total) by mouth 2 (two) times daily. 60 tablet 3    cyclobenzaprine (FLEXERIL) 5 MG tablet Take 1 tablet (5 mg total) by mouth 3 (three) times daily as needed for muscle spasms. 30 tablet 1   diclofenac Sodium (VOLTAREN) 1 % GEL Apply 2 g topically 4 (four) times daily. 350 g 3   gabapentin (NEURONTIN) 300 MG capsule TAKE 1 CAPSULE(300 MG) BY MOUTH THREE TIMES DAILY 270 capsule 3   glucose blood (TRUE METRIX BLOOD GLUCOSE TEST) test strip Use as instructed 100 each 12   insulin glargine (LANTUS SOLOSTAR) 100 UNIT/ML Solostar Pen Inject 30 Units into the skin 2 (two) times daily for 7 days, THEN 40 Units 2 (two) times daily for 7 days, THEN 50 Units 2 (two) times daily for 7 days. 16.8 mL 0   Lancets MISC 1 each 3 (three) times daily by Does not apply route. 100 each 11   lisinopril (ZESTRIL) 20 MG tablet TAKE 1 TABLET(20 MG) BY MOUTH DAILY 90 tablet 3   meloxicam (MOBIC) 15 MG tablet TAKE 1 TABLET(15 MG) BY MOUTH DAILY 90 tablet 4   Menthol, Topical Analgesic, (BIOFREEZE) 4 % GEL Apply 1 application topically 4 (four) times daily as needed. 89 mL 3   metFORMIN (GLUCOPHAGE) 1000 MG tablet Take 1 tablet (1,000 mg total) by mouth 2 (two) times daily with a meal. 180 tablet 3   Multiple Vitamins-Minerals (VITRUM SENIOR) TABS Take by mouth.     naproxen (NAPROSYN) 375 MG tablet Take 1 tablet (375 mg total) by mouth 2 (two) times daily with a meal. 20 tablet 0   propranolol (INDERAL) 40 MG tablet Take 1 tablet (40 mg total) by mouth 2 (two) times daily. As needed for anxiety 60 tablet 6   simvastatin (ZOCOR) 20 MG tablet Take 1 tablet (20 mg total) by mouth daily at 6 PM. 90 tablet 3   methocarbamol (ROBAXIN) 500 MG tablet Take 500 mg by mouth 2 (two) times daily.     Facility-Administered Medications Prior to Visit  Medication Dose Route Frequency Provider Last Rate Last Admin   0.9 %  sodium chloride infusion  500 mL Intravenous Once Cirigliano, Vito V, DO        Allergies  Allergen Reactions   Tramadol Nausea And Vomiting     Review of Systems  Genitourinary:       Nocturia 2 times       Objective:    Physical Exam HENT:     Head: Normocephalic and atraumatic.     Nose: Nose normal.     Mouth/Throat:     Mouth: Mucous membranes are moist.  Cardiovascular:     Rate and Rhythm: Normal rate and regular rhythm.     Pulses: Normal pulses.     Heart sounds: Normal heart sounds.  Pulmonary:     Effort: Pulmonary effort is normal.  Breath sounds: Normal breath sounds.  Abdominal:     Palpations: Abdomen is soft.  Musculoskeletal:     Cervical back: Normal range of motion.  Skin:    General: Skin is warm.     Capillary Refill: Capillary refill takes less than 2 seconds.  Neurological:     General: No focal deficit present.     Mental Status: He is alert and oriented to person, place, and time.  Psychiatric:        Mood and Affect: Mood normal.        Behavior: Behavior normal.        Thought Content: Thought content normal.        Judgment: Judgment normal.     BP 130/90 (BP Location: Left Arm, Patient Position: Sitting, Cuff Size: Normal)    Pulse 80    Temp 97.7 F (36.5 C) (Temporal)    Ht 5' 7"  (1.702 m)    Wt 165 lb 12.8 oz (75.2 kg)    SpO2 97%    BMI 25.97 kg/m  Wt Readings from Last 3 Encounters:  11/10/20 165 lb 12.8 oz (75.2 kg)  10/27/20 165 lb 6.4 oz (75 kg)  09/26/20 180 lb 12.4 oz (82 kg)    There are no preventive care reminders to display for this patient.  There are no preventive care reminders to display for this patient.   Lab Results  Component Value Date   TSH 0.605 05/06/2020   Lab Results  Component Value Date   WBC 3.3 (L) 09/26/2020   HGB 14.0 09/26/2020   HCT 42.6 09/26/2020   MCV 90.6 09/26/2020   PLT 258 09/26/2020   Lab Results  Component Value Date   NA 133 (L) 09/26/2020   K 4.1 09/26/2020   CO2 23 09/26/2020   GLUCOSE 314 (H) 09/26/2020   BUN 25 (H) 09/26/2020   CREATININE 1.07 09/26/2020   BILITOT 0.9 09/26/2020   ALKPHOS 54  09/26/2020   AST 20 09/26/2020   ALT 28 09/26/2020   PROT 6.0 (L) 09/26/2020   ALBUMIN 3.5 09/26/2020   CALCIUM 8.7 (L) 09/26/2020   ANIONGAP 10 09/26/2020   Lab Results  Component Value Date   CHOL 220 (H) 05/06/2020   Lab Results  Component Value Date   HDL 82 05/06/2020   Lab Results  Component Value Date   LDLCALC 123 (H) 05/06/2020   Lab Results  Component Value Date   TRIG 86 05/06/2020   Lab Results  Component Value Date   CHOLHDL 2.7 05/06/2020   Lab Results  Component Value Date   HGBA1C 11.7 (A) 10/27/2020       Assessment & Plan:   Problem List Items Addressed This Visit      Cardiovascular and Mediastinum   Essential hypertension   Relevant Medications   sildenafil (VIAGRA) 25 MG tablet    Other Visit Diagnoses    Encourage compliance with current treatment regimen  Dose adjustment as previously ordered Encourage regular CBG monitoring Encourage contacting office if excessive hyperglycemia and or hypoglycemia Lifestyle modification with healthy diet (fewer calories, more high fiber foods, whole grains and non-starchy vegetables, lower fat meat and fish, low-fat diary include healthy oils) regular exercise (physical activity) and  Home BP monitoring also encouraged goal <130/80     Erectile dysfunction, unspecified erectile dysfunction type     Trial sidenafil   Relevant Medications   sildenafil (VIAGRA) 25 MG tablet       Meds ordered this encounter  Medications   sildenafil (VIAGRA) 25 MG tablet    Sig: Take 1 tablet (25 mg total) by mouth daily as needed for erectile dysfunction.    Dispense:  10 tablet    Refill:  0    Order Specific Question:   Supervising Provider    Answer:   Tresa Garter [[id number]]     Vevelyn Francois, NP

## 2020-11-10 NOTE — Patient Instructions (Signed)
Erectile Dysfunction Erectile dysfunction (ED) is the inability to get or keep an erection in order to have sexual intercourse. Erectile dysfunction may include:  Inability to get an erection.  Lack of enough hardness of the erection to allow penetration.  Loss of the erection before sex is finished. What are the causes? This condition may be caused by:  Certain medicines, such as: ? Pain relievers. ? Antihistamines. ? Antidepressants. ? Blood pressure medicines. ? Water pills (diuretics). ? Ulcer medicines. ? Muscle relaxants. ? Drugs.  Excessive drinking.  Psychological causes, such as: ? Anxiety. ? Depression. ? Sadness. ? Exhaustion. ? Performance fear. ? Stress.  Physical causes, such as: ? Artery problems. This may include diabetes, smoking, liver disease, or atherosclerosis. ? High blood pressure. ? Hormonal problems, such as low testosterone. ? Obesity. ? Nerve problems. This may include back or pelvic injuries, diabetes mellitus, multiple sclerosis, or Parkinson disease. What are the signs or symptoms? Symptoms of this condition include:  Inability to get an erection.  Lack of enough hardness of the erection to allow penetration.  Loss of the erection before sex is finished.  Normal erections at some times, but with frequent unsatisfactory episodes.  Low sexual satisfaction in either partner due to erection problems.  A curved penis occurring with erection. The curve may cause pain or the penis may be too curved to allow for intercourse.  Never having nighttime erections. How is this diagnosed? This condition is often diagnosed by:  Performing a physical exam to find other diseases or specific problems with the penis.  Asking you detailed questions about the problem.  Performing blood tests to check for diabetes mellitus or to measure hormone levels.  Performing other tests to check for underlying health conditions.  Performing an ultrasound  exam to check for scarring.  Performing a test to check blood flow to the penis.  Doing a sleep study at home to measure nighttime erections. How is this treated? This condition may be treated by:  Medicine taken by mouth to help you achieve an erection (oral medicine).  Hormone replacement therapy to replace low testosterone levels.  Medicine that is injected into the penis. Your health care provider may instruct you how to give yourself these injections at home.  Vacuum pump. This is a pump with a ring on it. The pump and ring are placed on the penis and used to create pressure that helps the penis become erect.  Penile implant surgery. In this procedure, you may receive: ? An inflatable implant. This consists of cylinders, a pump, and a reservoir. The cylinders can be inflated with a fluid that helps to create an erection, and they can be deflated after intercourse. ? A semi-rigid implant. This consists of two silicone rubber rods. The rods provide some rigidity. They are also flexible, so the penis can both curve downward in its normal position and become straight for sexual intercourse.  Blood vessel surgery, to improve blood flow to the penis. During this procedure, a blood vessel from a different part of the body is placed into the penis to allow blood to flow around (bypass) damaged or blocked blood vessels.  Lifestyle changes, such as exercising more, losing weight, and quitting smoking. Follow these instructions at home: Medicines   Take over-the-counter and prescription medicines only as told by your health care provider. Do not increase the dosage without first discussing it with your health care provider.  If you are using self-injections, perform injections as directed by your   health care provider. Make sure to avoid any veins that are on the surface of the penis. After giving an injection, apply pressure to the injection site for 5 minutes. General  instructions  Exercise regularly, as directed by your health care provider. Work with your health care provider to lose weight, if needed.  Do not use any products that contain nicotine or tobacco, such as cigarettes and e-cigarettes. If you need help quitting, ask your health care provider.  Before using a vacuum pump, read the instructions that come with the pump and discuss any questions with your health care provider.  Keep all follow-up visits as told by your health care provider. This is important. Contact a health care provider if:  You feel nauseous.  You vomit. Get help right away if:  You are taking oral or injectable medicines and you have an erection that lasts longer than 4 hours. If your health care provider is unavailable, go to the nearest emergency room for evaluation. An erection that lasts much longer than 4 hours can result in permanent damage to your penis.  You have severe pain in your groin or abdomen.  You develop redness or severe swelling of your penis.  You have redness spreading up into your groin or lower abdomen.  You are unable to urinate.  You experience chest pain or a rapid heart beat (palpitations) after taking oral medicines. Summary  Erectile dysfunction (ED) is the inability to get or keep an erection during sexual intercourse. This problem can usually be treated successfully.  This condition is diagnosed based on a physical exam, your symptoms, and tests to determine the cause. Treatment varies depending on the cause, and may include medicines, hormone therapy, surgery, or vacuum pump.  You may need follow-up visits to make sure that you are using your medicines or devices correctly.  Get help right away if you are taking or injecting medicines and you have an erection that lasts longer than 4 hours. This information is not intended to replace advice given to you by your health care provider. Make sure you discuss any questions you have with  your health care provider. Document Revised: 10/04/2017 Document Reviewed: 11/07/2016 Elsevier Patient Education  2020 [employer]. Diabetes Mellitus and Exercise Exercising regularly is important for your overall health, especially when you have diabetes (diabetes mellitus). Exercising is not only about losing weight. It has many other health benefits, such as increasing muscle strength and bone density and reducing body fat and stress. This leads to improved fitness, flexibility, and endurance, all of which result in better overall health. Exercise has additional benefits for people with diabetes, including:  Reducing appetite.  Helping to lower and control blood glucose.  Lowering blood pressure.  Helping to control amounts of fatty substances (lipids) in the blood, such as cholesterol and triglycerides.  Helping the body to respond better to insulin (improving insulin sensitivity).  Reducing how much insulin the body needs.  Decreasing the risk for heart disease by: ? Lowering cholesterol and triglyceride levels. ? Increasing the levels of good cholesterol. ? Lowering blood glucose levels. What is my activity plan? Your health care provider or certified diabetes educator can help you make a plan for the type and frequency of exercise (activity plan) that works for you. Make sure that you:  Do at least 150 minutes of moderate-intensity or vigorous-intensity exercise each week. This could be brisk walking, biking, or water aerobics. ? Do stretching and strength exercises, such as yoga or weightlifting,  at least 2 times a week. ? Spread out your activity over at least 3 days of the week.  Get some form of physical activity every day. ? Do not go more than 2 days in a row without some kind of physical activity. ? Avoid being inactive for more than 30 minutes at a time. Take frequent breaks to walk or stretch.  Choose a type of exercise or activity that you enjoy, and set realistic  goals.  Start slowly, and gradually increase the intensity of your exercise over time. What do I need to know about managing my diabetes?   Check your blood glucose before and after exercising. ? If your blood glucose is 240 mg/dL (13.3 mmol/L) or higher before you exercise, check your urine for ketones. If you have ketones in your urine, do not exercise until your blood glucose returns to normal. ? If your blood glucose is 100 mg/dL (5.6 mmol/L) or lower, eat a snack containing 15-20 grams of carbohydrate. Check your blood glucose 15 minutes after the snack to make sure that your level is above 100 mg/dL (5.6 mmol/L) before you start your exercise.  Know the symptoms of low blood glucose (hypoglycemia) and how to treat it. Your risk for hypoglycemia increases during and after exercise. Common symptoms of hypoglycemia can include: ? Hunger. ? Anxiety. ? Sweating and feeling clammy. ? Confusion. ? Dizziness or feeling light-headed. ? Increased heart rate or palpitations. ? Blurry vision. ? Tingling or numbness around the mouth, lips, or tongue. ? Tremors or shakes. ? Irritability.  Keep a rapid-acting carbohydrate snack available before, during, and after exercise to help prevent or treat hypoglycemia.  Avoid injecting insulin into areas of the body that are going to be exercised. For example, avoid injecting insulin into: ? The arms, when playing tennis. ? The legs, when jogging.  Keep records of your exercise habits. Doing this can help you and your health care provider adjust your diabetes management plan as needed. Write down: ? Food that you eat before and after you exercise. ? Blood glucose levels before and after you exercise. ? The type and amount of exercise you have done. ? When your insulin is expected to peak, if you use insulin. Avoid exercising at times when your insulin is peaking.  When you start a new exercise or activity, work with your health care provider to make  sure the activity is safe for you, and to adjust your insulin, medicines, or food intake as needed.  Drink plenty of water while you exercise to prevent dehydration or heat stroke. Drink enough fluid to keep your urine clear or pale yellow. Summary  Exercising regularly is important for your overall health, especially when you have diabetes (diabetes mellitus).  Exercising has many health benefits, such as increasing muscle strength and bone density and reducing body fat and stress.  Your health care provider or certified diabetes educator can help you make a plan for the type and frequency of exercise (activity plan) that works for you.  When you start a new exercise or activity, work with your health care provider to make sure the activity is safe for you, and to adjust your insulin, medicines, or food intake as needed. This information is not intended to replace advice given to you by your health care provider. Make sure you discuss any questions you have with your health care provider. Document Revised: 05/16/2017 Document Reviewed: 04/02/2016 Elsevier Patient Education  [place].

## 2020-11-11 ENCOUNTER — Other Ambulatory Visit: Payer: Commercial Managed Care - PPO

## 2020-11-12 ENCOUNTER — Encounter: Payer: Self-pay | Admitting: Nurse Practitioner

## 2020-11-14 ENCOUNTER — Ambulatory Visit: Payer: Commercial Managed Care - PPO

## 2020-11-18 ENCOUNTER — Ambulatory Visit: Payer: Commercial Managed Care - PPO

## 2020-11-24 ENCOUNTER — Other Ambulatory Visit: Payer: Commercial Managed Care - PPO

## 2020-11-24 DIAGNOSIS — Z20822 Contact with and (suspected) exposure to covid-19: Secondary | ICD-10-CM

## 2020-11-26 LAB — NOVEL CORONAVIRUS, NAA: SARS-CoV-2, NAA: NOT DETECTED

## 2020-11-26 LAB — SARS-COV-2, NAA 2 DAY TAT

## 2020-11-29 ENCOUNTER — Ambulatory Visit: Payer: Commercial Managed Care - PPO | Attending: Nurse Practitioner

## 2020-12-02 ENCOUNTER — Telehealth: Payer: Self-pay

## 2020-12-02 ENCOUNTER — Other Ambulatory Visit (HOSPITAL_BASED_OUTPATIENT_CLINIC_OR_DEPARTMENT_OTHER): Payer: Self-pay | Admitting: Internal Medicine

## 2020-12-02 ENCOUNTER — Ambulatory Visit: Payer: Commercial Managed Care - PPO | Attending: Internal Medicine

## 2020-12-02 ENCOUNTER — Encounter: Payer: Commercial Managed Care - PPO | Admitting: Physical Medicine and Rehabilitation

## 2020-12-02 DIAGNOSIS — Z23 Encounter for immunization: Secondary | ICD-10-CM

## 2020-12-02 MED FILL — MODERNA COVID-19 VACCINE 10: 100 | 28 days supply | Qty: 0 | Fill #0

## 2020-12-02 NOTE — Telephone Encounter (Signed)
Patient called he needs his appointment for today to be rescheduled due to having a conflicting appointment. Dean Robertson

## 2020-12-02 NOTE — Telephone Encounter (Signed)
Called pt and resch 3/4

## 2020-12-02 NOTE — Progress Notes (Signed)
   Covid-[facility] Vaccination [facility]  Name:  Arkel Cartwright    MRN: [id number] DOB: 1960/12/24  12/02/2020  Mr. Shenk was observed post Covid-19 immunization for 15 minutes without incident. He was provided with Vaccine Information Sheet and instruction to access the V-Safe system.   Mr. Mcqueen was instructed to call 911 with any severe reactions post vaccine: Marland Kitchen Difficulty breathing  . Swelling of face and throat  . A fast heartbeat  . A bad rash all over body  . Dizziness and weakness   Immunizations Administered    Name Date Dose VIS Date Route   Moderna Covid-19 Booster Vaccine 12/02/2020  9:36 AM 0.25 mL 08/24/2020 Intramuscular   Manufacturer: Levan Hurst   Lot: [id number]   [place]: [id number]

## 2020-12-13 ENCOUNTER — Ambulatory Visit (HOSPITAL_COMMUNITY)
Admission: EM | Admit: 2020-12-13 | Discharge: 2020-12-13 | Disposition: A | Discharge status: Home / Self Care | Payer: Commercial Managed Care - PPO | Attending: Student | Admitting: Student

## 2020-12-13 ENCOUNTER — Encounter (HOSPITAL_COMMUNITY): Payer: Self-pay

## 2020-12-13 ENCOUNTER — Other Ambulatory Visit: Payer: Self-pay

## 2020-12-13 DIAGNOSIS — I10 Essential (primary) hypertension: Secondary | ICD-10-CM

## 2020-12-13 DIAGNOSIS — Z794 Long term (current) use of insulin: Secondary | ICD-10-CM

## 2020-12-13 DIAGNOSIS — M5442 Lumbago with sciatica, left side: Secondary | ICD-10-CM

## 2020-12-13 DIAGNOSIS — G8929 Other chronic pain: Secondary | ICD-10-CM

## 2020-12-13 DIAGNOSIS — E119 Type 2 diabetes mellitus without complications: Secondary | ICD-10-CM

## 2020-12-13 DIAGNOSIS — M5441 Lumbago with sciatica, right side: Secondary | ICD-10-CM | POA: Diagnosis not present

## 2020-12-13 MED ORDER — TIZANIDINE HCL 4 MG PO TABS: 4.0000 mg | ORAL_TABLET | Freq: Four times a day (QID) | ORAL | 0 refills | Status: DC | PRN

## 2020-12-13 MED ORDER — NAPROXEN 500 MG PO TABS: 500.0000 mg | ORAL_TABLET | Freq: Two times a day (BID) | ORAL | 0 refills | Status: DC

## 2020-12-13 NOTE — ED Triage Notes (Signed)
Pt presents with bilateral lower back pain that radiates down both legs X 3 weeks.

## 2020-12-13 NOTE — ED Provider Notes (Signed)
[place]    CSN: [id number] Arrival date & time: 12/13/20  2751      History   Chief Complaint Chief Complaint  Patient presents with  . Sciatic Pain     HPI Dean Robertson is a 60 y.o. male presenting for acute onset of chronic back pain. Endorses years of lower back pain with bilateral sciatica. He has been followed by ortho for this in the past. Today he is presenting for 3 weeks of bilateral lumbar paraspinous muscle tenderness with radiation of pain down bilateral legs. Denies trauma. Deniesdenies numbness in arms/legs, denies weakness in arms/legs, denies saddle anesthesia, denies bowel/bladder incontinence. Not taking medications for this.  -patient states his hypertension is well controlled on lisinopril however he has not taken this yet today -states diabetes is moderately well controlled on insulin. Sugars ranging from 80s to 300s.   HPI  Past Medical History:  Diagnosis Date  . Anxiety   . Arthritis    shoulders  . Coronary artery disease   . Diabetes mellitus without complication ([place])   . H/O heart artery stent    x1  . High cholesterol   . Hypertension     Patient Active Problem List   Diagnosis Date Noted  . Tick bite 05/17/2020  . Anxiety 02/05/2019  . Essential hypertension 12/08/2017  . Type 2 diabetes mellitus without complication, with long-term current use of insulin ([place]) 12/08/2017  . Chest pain 10/04/2017  . ASHD (arteriosclerotic heart disease) 11/15/2015  . Hyperlipidemia 11/15/2015  . Neuropathy 11/15/2015  . Sprain/strain 11/27/2013    Past Surgical History:  Procedure Laterality Date  . COLONOSCOPY     2012- normal   . CORONARY ANGIOPLASTY WITH STENT PLACEMENT         Home Medications    Prior to Admission medications   Medication Sig Start Date End Date Taking? Authorizing Provider  naproxen (NAPROSYN) 500 MG tablet Take 1 tablet (500 mg total) by mouth 2 (two) times daily. 12/13/20  Yes Hazel Sams, PA-C   tiZANidine (ZANAFLEX) 4 MG tablet Take 1 tablet (4 mg total) by mouth every 6 (six) hours as needed for muscle spasms. 12/13/20  Yes Hazel Sams, PA-C  acetaminophen (TYLENOL) 500 MG tablet Take 1 tablet (500 mg total) by mouth every 6 (six) hours as needed. 12/08/17   Dorena Dew, FNP  aspirin EC 81 MG tablet Take 81 mg by mouth daily.    [provider]  Blood Glucose Monitoring Suppl (TRUE METRIX AIR GLUCOSE METER) w/Device KIT 1 each by Does not apply route 3 (three) times daily. 10/27/20   Vevelyn Francois, NP  busPIRone (BUSPAR) 10 MG tablet Take 1 tablet (10 mg total) by mouth 2 (two) times daily. 10/27/20   Vevelyn Francois, NP  cyclobenzaprine (FLEXERIL) 5 MG tablet Take 1 tablet (5 mg total) by mouth 3 (three) times daily as needed for muscle spasms. 10/27/20   Vevelyn Francois, NP  diclofenac Sodium (VOLTAREN) 1 % GEL Apply 2 g topically 4 (four) times daily. 08/24/20   Jessy Oto, MD  gabapentin (NEURONTIN) 300 MG capsule TAKE 1 CAPSULE(300 MG) BY MOUTH THREE TIMES DAILY 05/06/20   Azzie Glatter, FNP  glucose blood (TRUE METRIX BLOOD GLUCOSE TEST) test strip Use as instructed 10/27/20   Vevelyn Francois, NP  insulin glargine (LANTUS SOLOSTAR) 100 UNIT/ML Solostar Pen Inject 30 Units into the skin 2 (two) times daily for 7 days, THEN 40 Units 2 (two)  times daily for 7 days, THEN 50 Units 2 (two) times daily for 7 days. 10/27/20 11/17/20  Vevelyn Francois, NP  Lancets MISC 1 each 3 (three) times daily by Does not apply route. 09/19/17   Dorena Dew, FNP  lisinopril (ZESTRIL) 20 MG tablet TAKE 1 TABLET(20 MG) BY MOUTH DAILY 10/27/20   Vevelyn Francois, NP  meloxicam (MOBIC) 15 MG tablet TAKE 1 TABLET(15 MG) BY MOUTH DAILY 09/20/20   Jessy Oto, MD  Menthol, Topical Analgesic, (BIOFREEZE) 4 % GEL Apply 1 application topically 4 (four) times daily as needed. 05/17/20   Azzie Glatter, FNP  metFORMIN (GLUCOPHAGE) 1000 MG tablet Take 1 tablet (1,000 mg total) by mouth 2  (two) times daily with a meal. 10/27/20   Vevelyn Francois, NP  methocarbamol (ROBAXIN) 500 MG tablet Take 500 mg by mouth 2 (two) times daily. 11/09/20   [provider]  Multiple Vitamins-Minerals (VITRUM SENIOR) TABS Take by mouth.    [provider]  propranolol (INDERAL) 40 MG tablet Take 1 tablet (40 mg total) by mouth 2 (two) times daily. As needed for anxiety 10/27/20   Vevelyn Francois, NP  sildenafil (VIAGRA) 25 MG tablet Take 1 tablet (25 mg total) by mouth daily as needed for erectile dysfunction. 11/10/20   Vevelyn Francois, NP  simvastatin (ZOCOR) 20 MG tablet Take 1 tablet (20 mg total) by mouth daily at 6 PM. 10/27/20   Vevelyn Francois, NP    Family History Family History  Problem Relation Age of Onset  . Hypertension Mother   . Diabetes Father   . Colon polyps Neg Hx   . Colon cancer Neg Hx   . Esophageal cancer Neg Hx   . Rectal cancer Neg Hx   . Stomach cancer Neg Hx     Social History Social History   Tobacco Use  . Smoking status: Former Research scientist (life sciences)  . Smokeless tobacco: Never Used  Substance Use Topics  . Alcohol use: Yes    Comment: occ  . Drug use: Not Currently     Allergies   Tramadol   Review of Systems Review of Systems  Constitutional: Negative for chills, fever and unexpected weight change.  Respiratory: Negative for chest tightness and shortness of breath.   Cardiovascular: Negative for chest pain and palpitations.  Gastrointestinal: Negative for abdominal pain, diarrhea, nausea and vomiting.  Genitourinary: Negative for decreased urine volume, difficulty urinating and frequency.  Musculoskeletal: Positive for back pain. Negative for arthralgias, gait problem, joint swelling, myalgias, neck pain and neck stiffness.  Skin: Negative for wound.  Neurological: Negative for dizziness, tremors, seizures, syncope, facial asymmetry, speech difficulty, weakness, light-headedness, numbness and headaches.     Physical Exam Triage Vital  Signs ED Triage Vitals  Enc Vitals Group     BP 12/13/20 1102 (!) 142/102     Pulse Rate 12/13/20 1102 65     Resp 12/13/20 1102 17     Temp 12/13/20 1102 97.7 F (36.5 C)     Temp Source 12/13/20 1102 Oral     SpO2 12/13/20 1102 100 %     Weight --      Height --      Head Circumference --      Peak Flow --      Pain Score 12/13/20 1101 9     Pain Loc --      Pain Edu? --      Excl. in [place]? --  No data found.  Updated Vital Signs BP (!) 142/102 (BP Location: Left Arm)   Pulse 65   Temp 97.7 F (36.5 C) (Oral)   Resp 17   SpO2 100%   Visual Acuity Right Eye Distance:   Left Eye Distance:   Bilateral Distance:    Right Eye Near:   Left Eye Near:    Bilateral Near:     Physical Exam Vitals reviewed.  Constitutional:      General: He is not in acute distress.    Appearance: Normal appearance. He is not ill-appearing.  HENT:     Head: Normocephalic and atraumatic.  Cardiovascular:     Rate and Rhythm: Normal rate and regular rhythm.     Heart sounds: Normal heart sounds.  Pulmonary:     Effort: Pulmonary effort is normal.     Breath sounds: Normal breath sounds and air entry.  Abdominal:     Tenderness: There is no abdominal tenderness. There is no right CVA tenderness, left CVA tenderness, guarding or rebound.     Comments: No bowel or bladder incontinence.  Musculoskeletal:     Cervical back: Normal range of motion. No swelling, deformity, signs of trauma, rigidity, spasms, tenderness, bony tenderness or crepitus. No pain with movement.     Thoracic back: No swelling, deformity, signs of trauma, spasms, tenderness or bony tenderness. Normal range of motion. No scoliosis.     Lumbar back: Spasms and tenderness present. No swelling, deformity, signs of trauma or bony tenderness. Normal range of motion. Positive right straight leg raise test and positive left straight leg raise test. No scoliosis.     Comments: Strength 5/5 in UEs and LEs, sensation intact.  Bilateral lumbar paraspinous muscle tenderness to deep palpation. No spinous tenderness or deformity. Bilateral positive straight leg raise. Gait normal.  Neurological:     General: No focal deficit present.     Mental Status: He is alert.     Cranial Nerves: No cranial nerve deficit.     Comments: Strength 5/5 in UEs and LEs. Gait normal. Sensation intact in UEs and LEs.   Psychiatric:        Mood and Affect: Mood normal.        Behavior: Behavior normal.        Thought Content: Thought content normal.        Judgment: Judgment normal.      UC Treatments / Results  Labs (all labs ordered are listed, but only abnormal results are displayed) Labs Reviewed - No data to display  EKG   Radiology No results found.  Procedures Procedures (including critical care time)  Medications Ordered in UC Medications - No data to display  Initial Impression / Assessment and Plan / UC Course  I have reviewed the triage vital signs and the nursing notes.  Pertinent labs & imaging results that were available during my care of the patient were reviewed by me and considered in my medical decision making (see chart for details).     For acute exacerbation of chronic back pain, zanaflex and naproxen as below. Prednisone not sent as he is a poorly controlled diabetic. He already is followed by ortho for this, so f/u with them if symptoms worsen/persist. Denies red flag symptoms- denies numbness in arms/legs, denies weakness in arms/legs, denies saddle anesthesia, denies bowel/bladder incontinence.   Continue lisinopril as directed.  Spent over 40 minutes obtaining H&P, performing physical, discussing results, treatment plan and plan for follow-up with patient. Patient  agrees with plan.   This chart was dictated using voice recognition software, Dragon. Despite the best efforts of this provider to proofread and correct errors, errors may still occur which can change documentation  meaning.   Final Clinical Impressions(s) / UC Diagnoses   Final diagnoses:  Essential hypertension  Type 2 diabetes mellitus without complication, with long-term current use of insulin ([place])  Chronic bilateral low back pain with bilateral sciatica     Discharge Instructions     -For your back pain, start the muscle relaxer as directed.  This is Zanaflex (tizanidine).  You can take this 3 times a day.  It can make you drowsy, so take at night or when you have to drive.  -Also try Naproxen, up to twice daily with food.  -please follow-up with your orthopedist for further evaluation of your chronic back pain. -Continue your blood pressure medications as directed. Monitor your BP at home or the pharmacy. If this continues to be higher than 140/90, please follow-up with PCP for medication management. Head to the ED if left-sided chest pain, pain down left arm, dizziness, etc.  -Continue to monitor your sugars at home and use your insulin as directed.     ED Prescriptions    Medication Sig Dispense Auth. Provider   naproxen (NAPROSYN) 500 MG tablet Take 1 tablet (500 mg total) by mouth 2 (two) times daily. 30 tablet Hazel Sams, PA-C   tiZANidine (ZANAFLEX) 4 MG tablet Take 1 tablet (4 mg total) by mouth every 6 (six) hours as needed for muscle spasms. 30 tablet Hazel Sams, PA-C     I have reviewed the PDMP during this encounter.   Hazel Sams, PA-C 12/13/20 1319

## 2020-12-13 NOTE — Discharge Instructions (Addendum)
-  For your back pain, start the muscle relaxer as directed.  This is Zanaflex (tizanidine).  You can take this 3 times a day.  It can make you drowsy, so take at night or when you have to drive.  -Also try Naproxen, up to twice daily with food.  -please follow-up with your orthopedist for further evaluation of your chronic back pain. -Continue your blood pressure medications as directed. Monitor your BP at home or the pharmacy. If this continues to be higher than 140/90, please follow-up with PCP for medication management. Head to the ED if left-sided chest pain, pain down left arm, dizziness, etc.  -Continue to monitor your sugars at home and use your insulin as directed.

## 2020-12-19 ENCOUNTER — Ambulatory Visit (INDEPENDENT_AMBULATORY_CARE_PROVIDER_SITE_OTHER): Payer: Commercial Managed Care - PPO | Admitting: Nurse Practitioner

## 2020-12-19 ENCOUNTER — Encounter: Payer: Self-pay | Admitting: Nurse Practitioner

## 2020-12-19 ENCOUNTER — Other Ambulatory Visit: Payer: Self-pay

## 2020-12-19 VITALS — BP 139/84 | HR 67 | Temp 97.3°F | Ht 67.0 in | Wt 165.0 lb

## 2020-12-19 DIAGNOSIS — E7849 Other hyperlipidemia: Secondary | ICD-10-CM | POA: Diagnosis not present

## 2020-12-19 DIAGNOSIS — E119 Type 2 diabetes mellitus without complications: Secondary | ICD-10-CM | POA: Diagnosis not present

## 2020-12-19 DIAGNOSIS — I10 Essential (primary) hypertension: Secondary | ICD-10-CM | POA: Diagnosis not present

## 2020-12-19 DIAGNOSIS — M5442 Lumbago with sciatica, left side: Secondary | ICD-10-CM

## 2020-12-19 DIAGNOSIS — G8929 Other chronic pain: Secondary | ICD-10-CM

## 2020-12-19 DIAGNOSIS — Z794 Long term (current) use of insulin: Secondary | ICD-10-CM | POA: Diagnosis not present

## 2020-12-19 DIAGNOSIS — M5441 Lumbago with sciatica, right side: Secondary | ICD-10-CM

## 2020-12-19 DIAGNOSIS — M51369 Other intervertebral disc degeneration, lumbar region without mention of lumbar back pain or lower extremity pain: Secondary | ICD-10-CM

## 2020-12-19 DIAGNOSIS — M5136 Other intervertebral disc degeneration, lumbar region: Secondary | ICD-10-CM

## 2020-12-19 LAB — POCT URINALYSIS DIPSTICK
Bilirubin, UA: NEGATIVE
Blood, UA: NEGATIVE
Glucose, UA: POSITIVE — AB
Ketones, UA: NEGATIVE
Leukocytes, UA: NEGATIVE
Nitrite, UA: NEGATIVE
Protein, UA: NEGATIVE
Spec Grav, UA: 1.025 (ref 1.010–1.025)
Urobilinogen, UA: 0.2 E.U./dL
pH, UA: 5.5 (ref 5.0–8.0)

## 2020-12-19 LAB — POCT CBG (FASTING - GLUCOSE)-MANUAL ENTRY: Glucose Fasting, POC: 283 mg/dL — AB (ref 70–99)

## 2020-12-19 NOTE — Patient Instructions (Signed)
Diabetes Mellitus and Nutrition, Adult When you have diabetes, or diabetes mellitus, it is very important to have healthy eating habits because your blood sugar (glucose) levels are greatly affected by what you eat and drink. Eating healthy foods in the right amounts, at about the same times every day, can help you:  Control your blood glucose.  Lower your risk of heart disease.  Improve your blood pressure.  Reach or maintain a healthy weight. What can affect my meal plan? Every person with diabetes is different, and each person has different needs for a meal plan. Your health care provider may recommend that you work with a dietitian to make a meal plan that is best for you. Your meal plan may vary depending on factors such as:  The calories you need.  The medicines you take.  Your weight.  Your blood glucose, blood pressure, and cholesterol levels.  Your activity level.  Other health conditions you have, such as heart or kidney disease. How do carbohydrates affect me? Carbohydrates, also called carbs, affect your blood glucose level more than any other type of food. Eating carbs naturally raises the amount of glucose in your blood. Carb counting is a method for keeping track of how many carbs you eat. Counting carbs is important to keep your blood glucose at a healthy level, especially if you use insulin or take certain oral diabetes medicines. It is important to know how many carbs you can safely have in each meal. This is different for every person. Your dietitian can help you calculate how many carbs you should have at each meal and for each snack. How does alcohol affect me? Alcohol can cause a sudden decrease in blood glucose (hypoglycemia), especially if you use insulin or take certain oral diabetes medicines. Hypoglycemia can be a life-threatening condition. Symptoms of hypoglycemia, such as sleepiness, dizziness, and confusion, are similar to symptoms of having too much  alcohol.  Do not drink alcohol if: ? Your health care provider tells you not to drink. ? You are pregnant, may be pregnant, or are planning to become pregnant.  If you drink alcohol: ? Do not drink on an empty stomach. ? Limit how much you use to:  0-1 drink a day for women.  0-2 drinks a day for men. ? Be aware of how much alcohol is in your drink. In the [place] one drink equals one 12 oz bottle of beer (355 mL), one 5 oz glass of wine (148 mL), or one 1 oz glass of hard liquor (44 mL). ? Keep yourself hydrated with water, diet soda, or unsweetened iced tea.  Keep in mind that regular soda, juice, and other mixers may contain a lot of sugar and must be counted as carbs. What are tips for following this plan? Reading food labels  Start by checking the serving size on the "Nutrition Facts" label of packaged foods and drinks. The amount of calories, carbs, fats, and other nutrients listed on the label is based on one serving of the item. Many items contain more than one serving per package.  Check the total grams (g) of carbs in one serving. You can calculate the number of servings of carbs in one serving by dividing the total carbs by 15. For example, if a food has 30 g of total carbs per serving, it would be equal to 2 servings of carbs.  Check the number of grams (g) of saturated fats and trans fats in one serving. Choose foods that have   a low amount or none of these fats.  Check the number of milligrams (mg) of salt (sodium) in one serving. Most people should limit total sodium intake to less than 2,300 mg per day.  Always check the nutrition information of foods labeled as "low-fat" or "nonfat." These foods may be higher in added sugar or refined carbs and should be avoided.  Talk to your dietitian to identify your daily goals for nutrients listed on the label. Shopping  Avoid buying canned, pre-made, or processed foods. These foods tend to be high in fat, sodium, and added  sugar.  Shop around the outside edge of the grocery store. This is where you will most often find fresh fruits and vegetables, bulk grains, fresh meats, and fresh dairy. Cooking  Use low-heat cooking methods, such as baking, instead of high-heat cooking methods like deep frying.  Cook using healthy oils, such as olive, canola, or sunflower oil.  Avoid cooking with butter, cream, or high-fat meats. Meal planning  Eat meals and snacks regularly, preferably at the same times every day. Avoid going long periods of time without eating.  Eat foods that are high in fiber, such as fresh fruits, vegetables, beans, and whole grains. Talk with your dietitian about how many servings of carbs you can eat at each meal.  Eat 4-6 oz (112-168 g) of lean protein each day, such as lean meat, chicken, fish, eggs, or tofu. One ounce (oz) of lean protein is equal to: ? 1 oz (28 g) of meat, chicken, or fish. ? 1 egg. ?  cup (62 g) of tofu.  Eat some foods each day that contain healthy fats, such as avocado, nuts, seeds, and fish.   What foods should I eat? Fruits Berries. Apples. Oranges. Peaches. Apricots. Plums. Grapes. Mango. Papaya. Pomegranate. Kiwi. Cherries. Vegetables Lettuce. Spinach. Leafy greens, including kale, chard, collard greens, and mustard greens. Beets. Cauliflower. Cabbage. Broccoli. Carrots. Green beans. Tomatoes. Peppers. Onions. Cucumbers. Brussels sprouts. Grains Whole grains, such as whole-wheat or whole-grain bread, crackers, tortillas, cereal, and pasta. Unsweetened oatmeal. Quinoa. Brown or wild rice. Meats and other proteins Seafood. Poultry without skin. Lean cuts of poultry and beef. Tofu. Nuts. Seeds. Dairy Low-fat or fat-free dairy products such as milk, yogurt, and cheese. The items listed above may not be a complete list of foods and beverages you can eat. Contact a dietitian for more information. What foods should I avoid? Fruits Fruits canned with  syrup. Vegetables Canned vegetables. Frozen vegetables with butter or cream sauce. Grains Refined white flour and flour products such as bread, pasta, snack foods, and cereals. Avoid all processed foods. Meats and other proteins Fatty cuts of meat. Poultry with skin. Breaded or fried meats. Processed meat. Avoid saturated fats. Dairy Full-fat yogurt, cheese, or milk. Beverages Sweetened drinks, such as soda or iced tea. The items listed above may not be a complete list of foods and beverages you should avoid. Contact a dietitian for more information. Questions to ask a health care provider  Do I need to meet with a diabetes educator?  Do I need to meet with a dietitian?  What number can I call if I have questions?  When are the best times to check my blood glucose? Where to find more information:  American Diabetes Association: diabetes.org  Academy of Nutrition and Dietetics: www.eatright.org  National Institute of Diabetes and Digestive and Kidney Diseases: www.niddk.nih.gov  Association of Diabetes Care and Education Specialists: www.diabeteseducator.org Summary  It is important to have healthy eating   habits because your blood sugar (glucose) levels are greatly affected by what you eat and drink.  A healthy meal plan will help you control your blood glucose and maintain a healthy lifestyle.  Your health care provider may recommend that you work with a dietitian to make a meal plan that is best for you.  Keep in mind that carbohydrates (carbs) and alcohol have immediate effects on your blood glucose levels. It is important to count carbs and to use alcohol carefully. This information is not intended to replace advice given to you by your health care provider. Make sure you discuss any questions you have with your health care provider. Document Revised: 09/29/2019 Document Reviewed: 09/29/2019 Elsevier Patient Education  2021 [employer].   https://doi.org/10.23970/AHRQEPCCER227">  Chronic Back Pain When back pain lasts longer than 3 months, it is called chronic back pain. The cause of your back pain may not be known. Some common causes include:  Wear and tear (degenerative disease) of the bones, ligaments, or disks in your back.  Inflammation and stiffness in your back (arthritis). People who have chronic back pain often go through certain periods in which the pain is more intense (flare-ups). Many people can learn to manage the pain with home care. Follow these instructions at home: Pay attention to any changes in your symptoms. Take these actions to help with your pain: Managing pain and stiffness  If directed, apply ice to the painful area. Your health care provider may recommend applying ice during the first 24-48 hours after a flare-up begins. To do this: ? Put ice in a plastic bag. ? Place a towel between your skin and the bag. ? Leave the ice on for 20 minutes, 2-3 times per day.  If directed, apply heat to the affected area as often as told by your health care provider. Use the heat source that your health care provider recommends, such as a moist heat pack or a heating pad. ? Place a towel between your skin and the heat source. ? Leave the heat on for 20-30 minutes. ? Remove the heat if your skin turns bright red. This is especially important if you are unable to feel pain, heat, or cold. You may have a greater risk of getting burned.  Try soaking in a warm tub.      Activity  Avoid bending and other activities that make the problem worse.  Maintain a proper position when standing or sitting: ? When standing, keep your upper back and neck straight, with your shoulders pulled back. Avoid slouching. ? When sitting, keep your back straight and relax your shoulders. Do not round your shoulders or pull them backward.  Do not sit or stand in one place for long periods of time.  Take brief periods of rest  throughout the day. This will reduce your pain. Resting in a lying or standing position is usually better than sitting to rest.  When you are resting for longer periods, mix in some mild activity or stretching between periods of rest. This will help to prevent stiffness and pain.  Get regular exercise. Ask your health care provider what activities are safe for you.  Do not lift anything that is heavier than 10 lb (4.5 kg), or the limit that you are told, until your health care provider says that it is safe. Always use proper lifting technique, which includes: ? Bending your knees. ? Keeping the load close to your body. ? Avoiding twisting.  Sleep on a firm  mattress in a comfortable position. Try lying on your side with your knees slightly bent. If you lie on your back, put a pillow under your knees.   Medicines  Treatment may include medicines for pain and inflammation taken by mouth or applied to the skin, prescription pain medicine, or muscle relaxants. Take over-the-counter and prescription medicines only as told by your health care provider.  Ask your health care provider if the medicine prescribed to you: ? Requires you to avoid driving or using machinery. ? Can cause constipation. You may need to take these actions to prevent or treat constipation:  Drink enough fluid to keep your urine pale yellow.  Take over-the-counter or prescription medicines.  Eat foods that are high in fiber, such as beans, whole grains, and fresh fruits and vegetables.  Limit foods that are high in fat and processed sugars, such as fried or sweet foods. General instructions  Do not use any products that contain nicotine or tobacco, such as cigarettes, e-cigarettes, and chewing tobacco. If you need help quitting, ask your health care provider.  Keep all follow-up visits as told by your health care provider. This is important. Contact a health care provider if:  You have pain that is not relieved with  rest or medicine.  Your pain gets worse, or you have new pain.  You have a high fever.  You have rapid weight loss.  You have trouble doing your normal activities. Get help right away if:  You have weakness or numbness in one or both of your legs or feet.  You have trouble controlling your bladder or your bowels.  You have severe back pain and have any of the following: ? Nausea or vomiting. ? Pain in your abdomen. ? Shortness of breath or you faint. Summary  Chronic back pain is back pain that lasts longer than 3 months.  When a flare-up begins, apply ice to the painful area for the first 24-48 hours.  Apply a moist heat pad or use a heating pad on the painful area as directed by your health care provider.  When you are resting for longer periods, mix in some mild activity or stretching between periods of rest. This will help to prevent stiffness and pain. This information is not intended to replace advice given to you by your health care provider. Make sure you discuss any questions you have with your health care provider. Document Revised: 12/02/2019 Document Reviewed: 12/02/2019 Elsevier Patient Education  2021 [employer].

## 2020-12-19 NOTE — Progress Notes (Signed)
[place], [place]  [address] Phone:  [phone]   Fax:  [phone]   Established Patient Office Visit  Subjective:  Patient ID: Dean Robertson, male    DOB: 09/29/1961  Age: 60 y.o. MRN: [id number]  CC:  Chief Complaint  Patient presents with  . Follow-up    Follow up dm , having pain in groin area, pelvic area , had mri 3 months ago  , taking motrin for pain doesn't help     HPI Dean Robertson presents for follow up. He  has a past medical history of Anxiety, Arthritis, Coronary artery disease, Diabetes mellitus without complication ([place]), H/O heart artery stent, High cholesterol, and Hypertension.    Diabetes Mellitus Patient presents for follow up of diabetes. Current symptoms include: visual disturbances. Symptoms have progressed to a point and plateaued. Patient denies foot ulcerations, hypoglycemia , nausea, paresthesia of the feet, vomiting and weight loss. Evaluation to date has included: fasting blood sugar, fasting lipid panel and hemoglobin A1C.  Home sugars: BGs have been labile ranging between 92 and 190. Current treatment: Continued insulin which has been not very effective, Continued statin which has been somewhat effective and Continued ACE inhibitor/ARB which has been effective. Last dilated eye exam: 2020  Back Pain Patient presents for evaluation of low back problems. Symptoms have been present for several months and include pain in lower back (shooting and stabbing in character; 10.5/10 in severity) and stiffness in lower back and buttocks. Initial inciting event: none. Symptoms are worse varies with specific activites.. Alleviating factors identifiable by the patient are none. Aggravating factors identifiable by the patient are increased intrathoracic pressure (cough, sneeze, etc.). Treatments initiated by the patient: Tylenol, ibuprofen previous lower back problems: None, pain has been going on for greater than 2 years and  is gradually worsening. Previous work up: X-rays. Previous treatments: Cyclobenzaprine, gabapentin, methocarbamol, tizanidine, diclofenac gel.  Past Medical History:  Diagnosis Date  . Anxiety   . Arthritis    shoulders  . Coronary artery disease   . Diabetes mellitus without complication ([place])   . H/O heart artery stent    x1  . High cholesterol   . Hypertension     Past Surgical History:  Procedure Laterality Date  . COLONOSCOPY     2012- normal   . CORONARY ANGIOPLASTY WITH STENT PLACEMENT      Family History  Problem Relation Age of Onset  . Hypertension Mother   . Diabetes Father   . Colon polyps Neg Hx   . Colon cancer Neg Hx   . Esophageal cancer Neg Hx   . Rectal cancer Neg Hx   . Stomach cancer Neg Hx     Social History   Socioeconomic History  . Marital status: Legally Separated    Spouse name: Not on file  . Number of children: Not on file  . Years of education: Not on file  . Highest education level: Not on file  Occupational History  . Not on file  Tobacco Use  . Smoking status: Former Research scientist (life sciences)  . Smokeless tobacco: Never Used  Substance and Sexual Activity  . Alcohol use: Yes    Comment: occ  . Drug use: Not Currently  . Sexual activity: Not on file  Other Topics Concern  . Not on file  Social History Narrative  . Not on file   Social Determinants of Health   Financial Resource Strain: Not on file  Food Insecurity:  Not on file  Transportation Needs: Not on file  Physical Activity: Not on file  Stress: Not on file  Social Connections: Not on file  Intimate Partner Violence: Not on file    Outpatient Medications Prior to Visit  Medication Sig Dispense Refill  . acetaminophen (TYLENOL) 500 MG tablet Take 1 tablet (500 mg total) by mouth every 6 (six) hours as needed. 30 tablet 0  . aspirin EC 81 MG tablet Take 81 mg by mouth daily.    . Blood Glucose Monitoring Suppl (TRUE METRIX AIR GLUCOSE METER) w/Device KIT 1 each by Does not apply  route 3 (three) times daily. 1 kit 0  . busPIRone (BUSPAR) 10 MG tablet Take 1 tablet (10 mg total) by mouth 2 (two) times daily. 60 tablet 3  . cyclobenzaprine (FLEXERIL) 5 MG tablet Take 1 tablet (5 mg total) by mouth 3 (three) times daily as needed for muscle spasms. 30 tablet 1  . diclofenac Sodium (VOLTAREN) 1 % GEL Apply 2 g topically 4 (four) times daily. 350 g 3  . gabapentin (NEURONTIN) 300 MG capsule TAKE 1 CAPSULE(300 MG) BY MOUTH THREE TIMES DAILY 270 capsule 3  . glucose blood (TRUE METRIX BLOOD GLUCOSE TEST) test strip Use as instructed 100 each 12  . Lancets MISC 1 each 3 (three) times daily by Does not apply route. 100 each 11  . lisinopril (ZESTRIL) 20 MG tablet TAKE 1 TABLET(20 MG) BY MOUTH DAILY 90 tablet 3  . meloxicam (MOBIC) 15 MG tablet TAKE 1 TABLET(15 MG) BY MOUTH DAILY 90 tablet 4  . Menthol, Topical Analgesic, (BIOFREEZE) 4 % GEL Apply 1 application topically 4 (four) times daily as needed. 89 mL 3  . metFORMIN (GLUCOPHAGE) 1000 MG tablet Take 1 tablet (1,000 mg total) by mouth 2 (two) times daily with a meal. 180 tablet 3  . methocarbamol (ROBAXIN) 500 MG tablet Take 500 mg by mouth 2 (two) times daily.    . Multiple Vitamins-Minerals (VITRUM SENIOR) TABS Take by mouth.    . naproxen (NAPROSYN) 500 MG tablet Take 1 tablet (500 mg total) by mouth 2 (two) times daily. 30 tablet 0  . propranolol (INDERAL) 40 MG tablet Take 1 tablet (40 mg total) by mouth 2 (two) times daily. As needed for anxiety 60 tablet 6  . sildenafil (VIAGRA) 25 MG tablet Take 1 tablet (25 mg total) by mouth daily as needed for erectile dysfunction. 10 tablet 0  . simvastatin (ZOCOR) 20 MG tablet Take 1 tablet (20 mg total) by mouth daily at 6 PM. 90 tablet 3  . tiZANidine (ZANAFLEX) 4 MG tablet Take 1 tablet (4 mg total) by mouth every 6 (six) hours as needed for muscle spasms. 30 tablet 0  . insulin glargine (LANTUS SOLOSTAR) 100 UNIT/ML Solostar Pen Inject 30 Units into the skin 2 (two) times daily  for 7 days, THEN 40 Units 2 (two) times daily for 7 days, THEN 50 Units 2 (two) times daily for 7 days. 16.8 mL 0   Facility-Administered Medications Prior to Visit  Medication Dose Route Frequency Provider Last Rate Last Admin  . 0.9 %  sodium chloride infusion  500 mL Intravenous Once Cirigliano, Vito V, DO        Allergies  Allergen Reactions  . Tramadol Nausea And Vomiting    ROS Review of Systems  Gastrointestinal: Negative for blood in stool, constipation, nausea and vomiting.  Genitourinary: Positive for testicular pain (with gap reflex during oral hygiene). Negative for hematuria, penile discharge,  penile pain, penile swelling and scrotal swelling.  Musculoskeletal: Positive for back pain and myalgias.       Stiffness in buttock and muscle tightness  Neurological: Positive for weakness. Negative for numbness.       No falls or recent injuries       Objective:    Physical Exam HENT:     Head: Normocephalic and atraumatic.     Nose: Nose normal.     Mouth/Throat:     Mouth: Mucous membranes are moist.  Cardiovascular:     Rate and Rhythm: Normal rate and regular rhythm.     Heart sounds: Murmur (Apical) heard.    Pulmonary:     Effort: Pulmonary effort is normal.     Breath sounds: Normal breath sounds.  Abdominal:     General: Bowel sounds are normal.     Palpations: Abdomen is soft.  Musculoskeletal:     Cervical back: Normal range of motion.     Lumbar back: Spasms and bony tenderness present. No swelling, edema or signs of trauma. Positive right straight leg raise test and positive left straight leg raise test. No scoliosis.     Right lower leg: No edema.     Left lower leg: No edema.  Skin:    General: Skin is warm and dry.     Capillary Refill: Capillary refill takes less than 2 seconds.  Neurological:     General: No focal deficit present.     Mental Status: He is alert and oriented to person, place, and time.  Psychiatric:        Mood and Affect:  Mood normal.        Behavior: Behavior normal.        Thought Content: Thought content normal.        Judgment: Judgment normal.     BP 139/84 (BP Location: Left Arm, Patient Position: Sitting, Cuff Size: Normal)   Pulse 67   Temp (!) 97.3 F (36.3 C) (Temporal)   Ht _0  (1.702 m)   Wt 165 lb (74.8 kg)   SpO2 97%   BMI 25.84 kg/m  Wt Readings from Last 3 Encounters:  12/19/20 165 lb (74.8 kg)  11/10/20 165 lb 12.8 oz (75.2 kg)  10/27/20 165 lb 6.4 oz (75 kg)     There are no preventive care reminders to display for this patient.  There are no preventive care reminders to display for this patient.  Lab Results  Component Value Date   TSH 0.605 05/06/2020   Lab Results  Component Value Date   WBC 3.3 (L) 09/26/2020   HGB 14.0 09/26/2020   HCT 42.6 09/26/2020   MCV 90.6 09/26/2020   PLT 258 09/26/2020   Lab Results  Component Value Date   NA 133 (L) 09/26/2020   K 4.1 09/26/2020   CO2 23 09/26/2020   GLUCOSE 314 (H) 09/26/2020   BUN 25 (H) 09/26/2020   CREATININE 1.07 09/26/2020   BILITOT 0.9 09/26/2020   ALKPHOS 54 09/26/2020   AST 20 09/26/2020   ALT 28 09/26/2020   PROT 6.0 (L) 09/26/2020   ALBUMIN 3.5 09/26/2020   CALCIUM 8.7 (L) 09/26/2020   ANIONGAP 10 09/26/2020   Lab Results  Component Value Date   CHOL 220 (H) 05/06/2020   Lab Results  Component Value Date   HDL 82 05/06/2020   Lab Results  Component Value Date   LDLCALC 123 (H) 05/06/2020   Lab Results  Component Value Date  TRIG 86 05/06/2020   Lab Results  Component Value Date   CHOLHDL 2.7 05/06/2020   Lab Results  Component Value Date   HGBA1C 11.7 (A) 10/27/2020      Assessment & Plan:   Problem List Items Addressed This Visit      Cardiovascular and Mediastinum   Essential hypertension Encouraged on going compliance with current medication regimen Encouraged home monitoring and recording BP <130/80 Eating a heart-healthy diet with less salt Encouraged regular  physical activity      Endocrine   Type 2 diabetes mellitus without complication, with long-term current use of insulin (HCC) - Primary Persistent no current adjustments in regimen due to patient's report of CBG. Will repeat A1c patient's physical examination Encourage compliance with current treatment regimen  Encourage regular CBG monitoring Encourage contacting office if excessive hyperglycemia and or hypoglycemia Lifestyle modification with healthy diet (fewer calories, more high fiber foods, whole grains and non-starchy vegetables, lower fat meat and fish, low-fat diary include healthy oils) regular exercise (physical activity) Opthalmology exam discussed  Home BP monitoring also encouraged goal <130/80     Relevant Orders   Comp. Metabolic Panel (12)   Microalbumin, urine   POCT urinalysis dipstick (Completed)   Glucose (CBG), Fasting (Completed)     Other   Hyperlipidemia   Relevant Orders   Lipid panel    Other Visit Diagnoses    Chronic bilateral low back pain with bilateral sciatica     Ongoing no real improvement gradually worsening with increased pressure activities adamant about needing an MRI   Other intervertebral disc degeneration, lumbar region       Relevant Orders   MR LUMBAR SPINE W WO CONTRAST      No orders of the defined types were placed in this encounter.   Follow-up: Return for Physcial VISIT,EST,40-64 [[id number]].    Vevelyn Francois, NP

## 2020-12-20 LAB — COMP. METABOLIC PANEL (12)
AST: 15 IU/L (ref 0–40)
Albumin/Globulin Ratio: 1.8 (ref 1.2–2.2)
Albumin: 4.4 g/dL (ref 3.8–4.9)
Alkaline Phosphatase: 78 IU/L (ref 44–121)
BUN/Creatinine Ratio: 18 (ref 10–24)
BUN: 21 mg/dL (ref 8–27)
Bilirubin Total: <0.2 mg/dL (ref 0.0–1.2)
Calcium: 9 mg/dL (ref 8.6–10.2)
Chloride: 99 mmol/L (ref 96–106)
Creatinine, Ser: 1.2 mg/dL (ref 0.76–1.27)
GFR calc Af Amer: 76 mL/min/{1.73_m2} (ref >59)
GFR calc non Af Amer: 65 mL/min/{1.73_m2} (ref >59)
Globulin, Total: 2.4 g/dL (ref 1.5–4.5)
Glucose: 292 mg/dL — ABNORMAL HIGH (ref 65–99)
Potassium: 4.7 mmol/L (ref 3.5–5.2)
Sodium: 136 mmol/L (ref 134–144)
Total Protein: 6.8 g/dL (ref 6.0–8.5)

## 2020-12-20 LAB — LIPID PANEL
Chol/HDL Ratio: 4.3 ratio (ref 0.0–5.0)
Cholesterol, Total: 231 mg/dL — ABNORMAL HIGH (ref 100–199)
HDL: 54 mg/dL (ref >39)
LDL Chol Calc (NIH): 131 mg/dL — ABNORMAL HIGH (ref 0–99)
Triglycerides: 259 mg/dL — ABNORMAL HIGH (ref 0–149)
VLDL Cholesterol Cal: 46 mg/dL — ABNORMAL HIGH (ref 5–40)

## 2020-12-20 LAB — MICROALBUMIN, URINE: Microalbumin, Urine: <3 ug/mL

## 2020-12-22 ENCOUNTER — Encounter (HOSPITAL_COMMUNITY): Payer: Self-pay | Admitting: Emergency Medicine

## 2020-12-22 ENCOUNTER — Inpatient Hospital Stay (HOSPITAL_COMMUNITY)
Admission: EM | Admit: 2020-12-22 | Discharge: 2020-12-25 | DRG: 247 | Disposition: A | Discharge status: Home / Self Care | Payer: Commercial Managed Care - PPO | Attending: Cardiology | Admitting: Cardiology

## 2020-12-22 ENCOUNTER — Other Ambulatory Visit: Payer: Self-pay

## 2020-12-22 ENCOUNTER — Emergency Department (HOSPITAL_COMMUNITY): Payer: Commercial Managed Care - PPO

## 2020-12-22 DIAGNOSIS — Z7982 Long term (current) use of aspirin: Secondary | ICD-10-CM

## 2020-12-22 DIAGNOSIS — I214 Non-ST elevation (NSTEMI) myocardial infarction: Principal | ICD-10-CM

## 2020-12-22 DIAGNOSIS — E7800 Pure hypercholesterolemia, unspecified: Secondary | ICD-10-CM | POA: Diagnosis present

## 2020-12-22 DIAGNOSIS — Z87891 Personal history of nicotine dependence: Secondary | ICD-10-CM

## 2020-12-22 DIAGNOSIS — Z955 Presence of coronary angioplasty implant and graft: Principal | ICD-10-CM

## 2020-12-22 DIAGNOSIS — Z8616 Personal history of COVID-19: Secondary | ICD-10-CM

## 2020-12-22 DIAGNOSIS — M1990 Unspecified osteoarthritis, unspecified site: Secondary | ICD-10-CM | POA: Diagnosis present

## 2020-12-22 DIAGNOSIS — R778 Other specified abnormalities of plasma proteins: Secondary | ICD-10-CM

## 2020-12-22 DIAGNOSIS — R079 Chest pain, unspecified: Secondary | ICD-10-CM

## 2020-12-22 DIAGNOSIS — Z79899 Other long term (current) drug therapy: Secondary | ICD-10-CM

## 2020-12-22 DIAGNOSIS — Z20822 Contact with and (suspected) exposure to covid-19: Secondary | ICD-10-CM | POA: Diagnosis present

## 2020-12-22 DIAGNOSIS — Z8249 Family history of ischemic heart disease and other diseases of the circulatory system: Secondary | ICD-10-CM

## 2020-12-22 DIAGNOSIS — I2510 Atherosclerotic heart disease of native coronary artery without angina pectoris: Secondary | ICD-10-CM | POA: Diagnosis present

## 2020-12-22 DIAGNOSIS — Z885 Allergy status to narcotic agent status: Secondary | ICD-10-CM

## 2020-12-22 DIAGNOSIS — Z833 Family history of diabetes mellitus: Secondary | ICD-10-CM

## 2020-12-22 DIAGNOSIS — Z794 Long term (current) use of insulin: Secondary | ICD-10-CM

## 2020-12-22 DIAGNOSIS — I10 Essential (primary) hypertension: Secondary | ICD-10-CM | POA: Diagnosis present

## 2020-12-22 DIAGNOSIS — E1165 Type 2 diabetes mellitus with hyperglycemia: Secondary | ICD-10-CM | POA: Diagnosis present

## 2020-12-22 DIAGNOSIS — I249 Acute ischemic heart disease, unspecified: Secondary | ICD-10-CM | POA: Diagnosis present

## 2020-12-22 DIAGNOSIS — Z791 Long term (current) use of non-steroidal anti-inflammatories (NSAID): Secondary | ICD-10-CM

## 2020-12-22 DIAGNOSIS — E785 Hyperlipidemia, unspecified: Secondary | ICD-10-CM | POA: Diagnosis present

## 2020-12-22 LAB — CBC
HCT: 44.1 % (ref 39.0–52.0)
Hemoglobin: 14.1 g/dL (ref 13.0–17.0)
MCH: 29.1 pg (ref 26.0–34.0)
MCHC: 32 g/dL (ref 30.0–36.0)
MCV: 91.1 fL (ref 80.0–100.0)
Platelets: 274 10*3/uL (ref 150–400)
RBC: 4.84 MIL/uL (ref 4.22–5.81)
RDW: 12.1 % (ref 11.5–15.5)
WBC: 4.7 10*3/uL (ref 4.0–10.5)
nRBC: 0 % (ref 0.0–0.2)

## 2020-12-22 LAB — TROPONIN I (HIGH SENSITIVITY)
Troponin I (High Sensitivity): 22 ng/L — ABNORMAL HIGH (ref <18)
Troponin I (High Sensitivity): 52 ng/L — ABNORMAL HIGH (ref <18)
Troponin I (High Sensitivity): 111 ng/L (ref <18)
Troponin I (High Sensitivity): 130 ng/L (ref <18)

## 2020-12-22 LAB — RESP PANEL BY RT-PCR (FLU A&B, COVID) ARPGX2
Influenza A by PCR: NEGATIVE
Influenza B by PCR: NEGATIVE
SARS Coronavirus 2 by RT PCR: NEGATIVE

## 2020-12-22 LAB — HIV ANTIBODY (ROUTINE TESTING W REFLEX): HIV Screen 4th Generation wRfx: NONREACTIVE

## 2020-12-22 LAB — CBG MONITORING, ED
Glucose-Capillary: 68 mg/dL — ABNORMAL LOW (ref 70–99)
Glucose-Capillary: 261 mg/dL — ABNORMAL HIGH (ref 70–99)

## 2020-12-22 LAB — GLUCOSE, CAPILLARY: Glucose-Capillary: 212 mg/dL — ABNORMAL HIGH (ref 70–99)

## 2020-12-22 LAB — BASIC METABOLIC PANEL
Anion gap: 10 (ref 5–15)
BUN: 17 mg/dL (ref 6–20)
CO2: 27 mmol/L (ref 22–32)
Calcium: 9.6 mg/dL (ref 8.9–10.3)
Chloride: 103 mmol/L (ref 98–111)
Creatinine, Ser: 1.22 mg/dL (ref 0.61–1.24)
GFR, Estimated: >60 mL/min (ref >60)
Glucose, Bld: 168 mg/dL — ABNORMAL HIGH (ref 70–99)
Potassium: 3.9 mmol/L (ref 3.5–5.1)
Sodium: 140 mmol/L (ref 135–145)

## 2020-12-22 LAB — HEMOGLOBIN A1C
Hgb A1c MFr Bld: 9.8 % — ABNORMAL HIGH (ref 4.8–5.6)
Hgb A1c MFr Bld: 10 % — ABNORMAL HIGH (ref 4.8–5.6)
Mean Plasma Glucose: 234.56 mg/dL
Mean Plasma Glucose: 240.3 mg/dL

## 2020-12-22 LAB — HEPARIN LEVEL (UNFRACTIONATED): Heparin Unfractionated: 0.37 IU/mL (ref 0.30–0.70)

## 2020-12-22 LAB — SURGICAL PCR SCREEN
MRSA, PCR: NEGATIVE
Staphylococcus aureus: NEGATIVE

## 2020-12-22 IMAGING — DX DG CHEST 2V
2 series · 2 of 2 positions shown · non-contrast
Comparison: [DATE]

CLINICAL DATA: Chest pain

EXAM:
CHEST - 2 VIEW

[w chest pa]
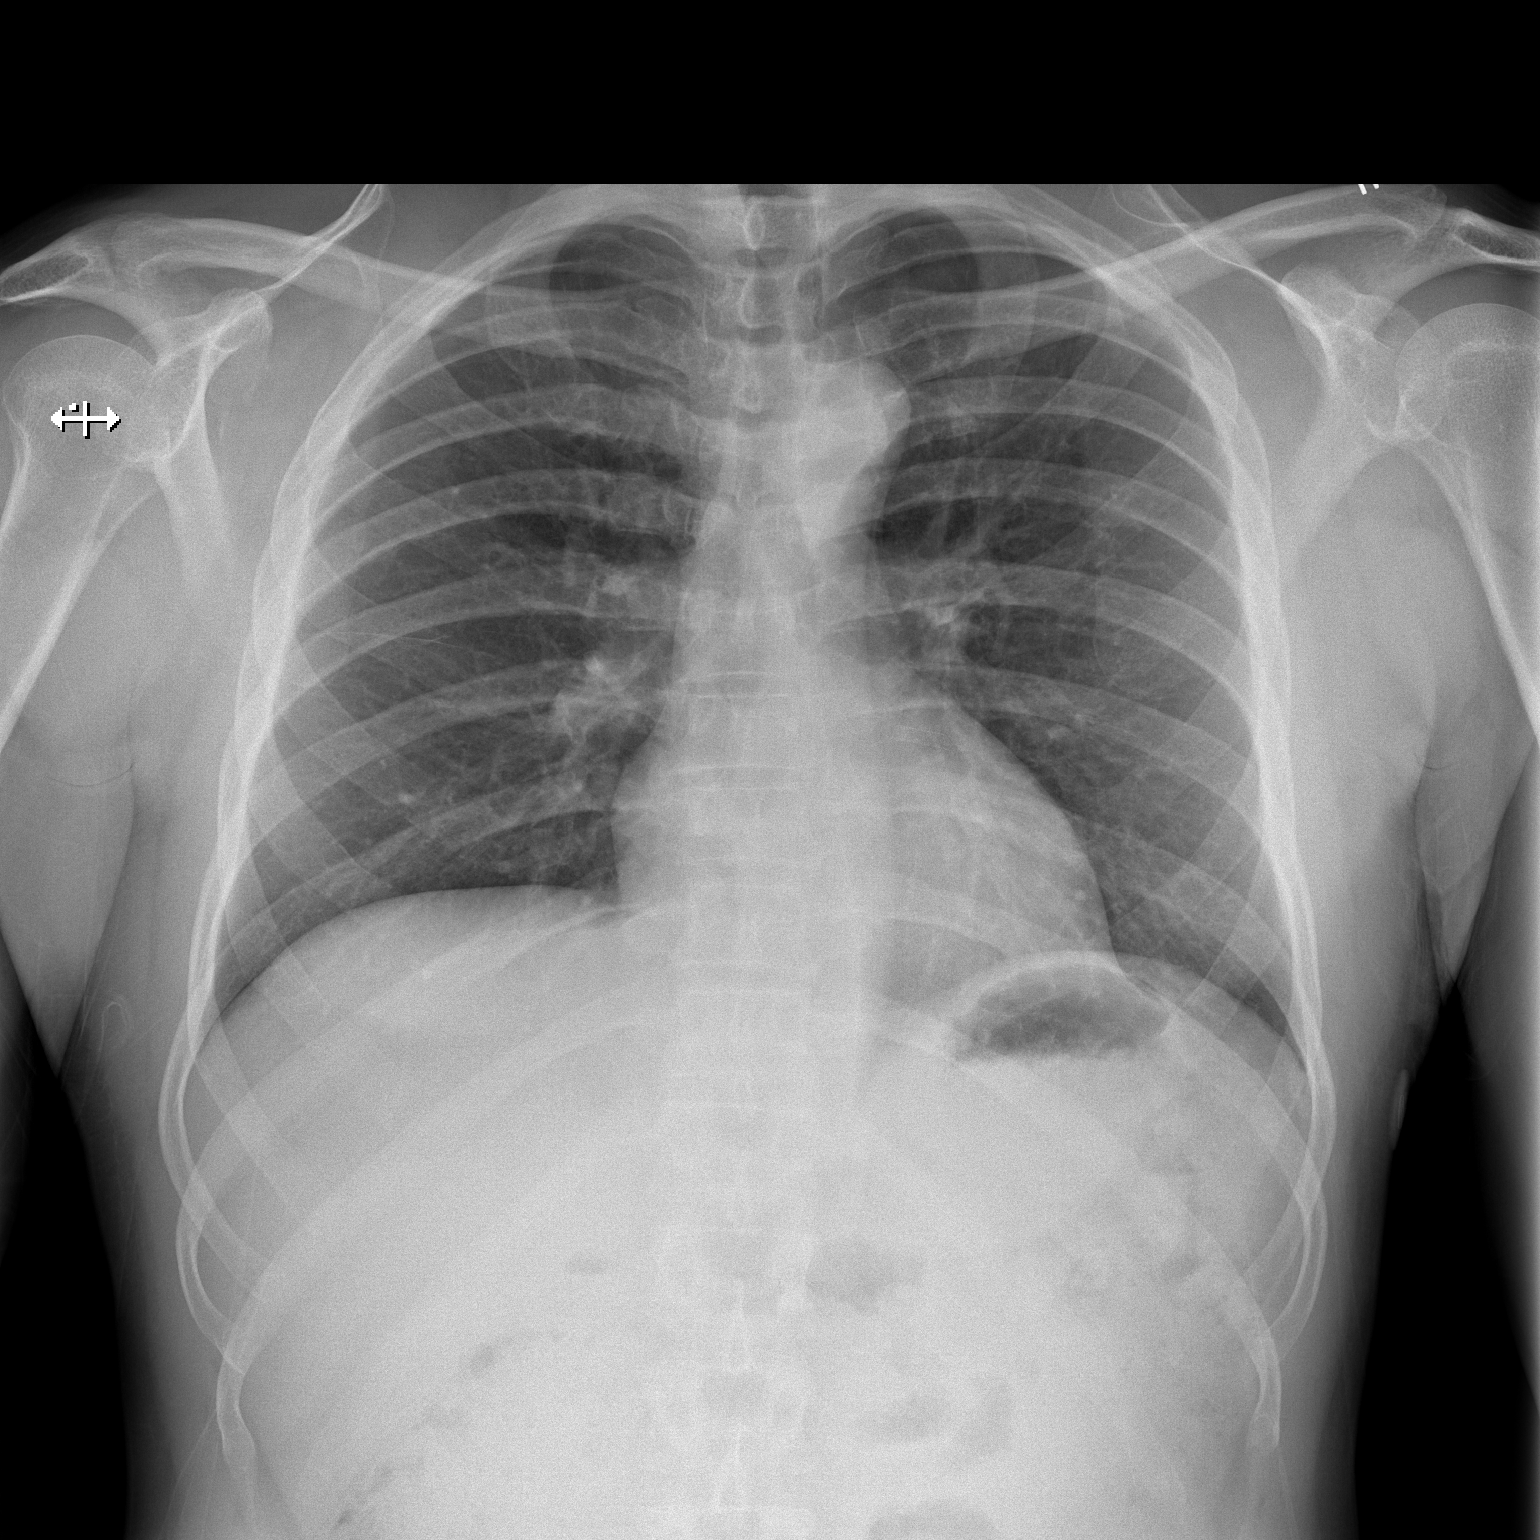

[w chest lat]
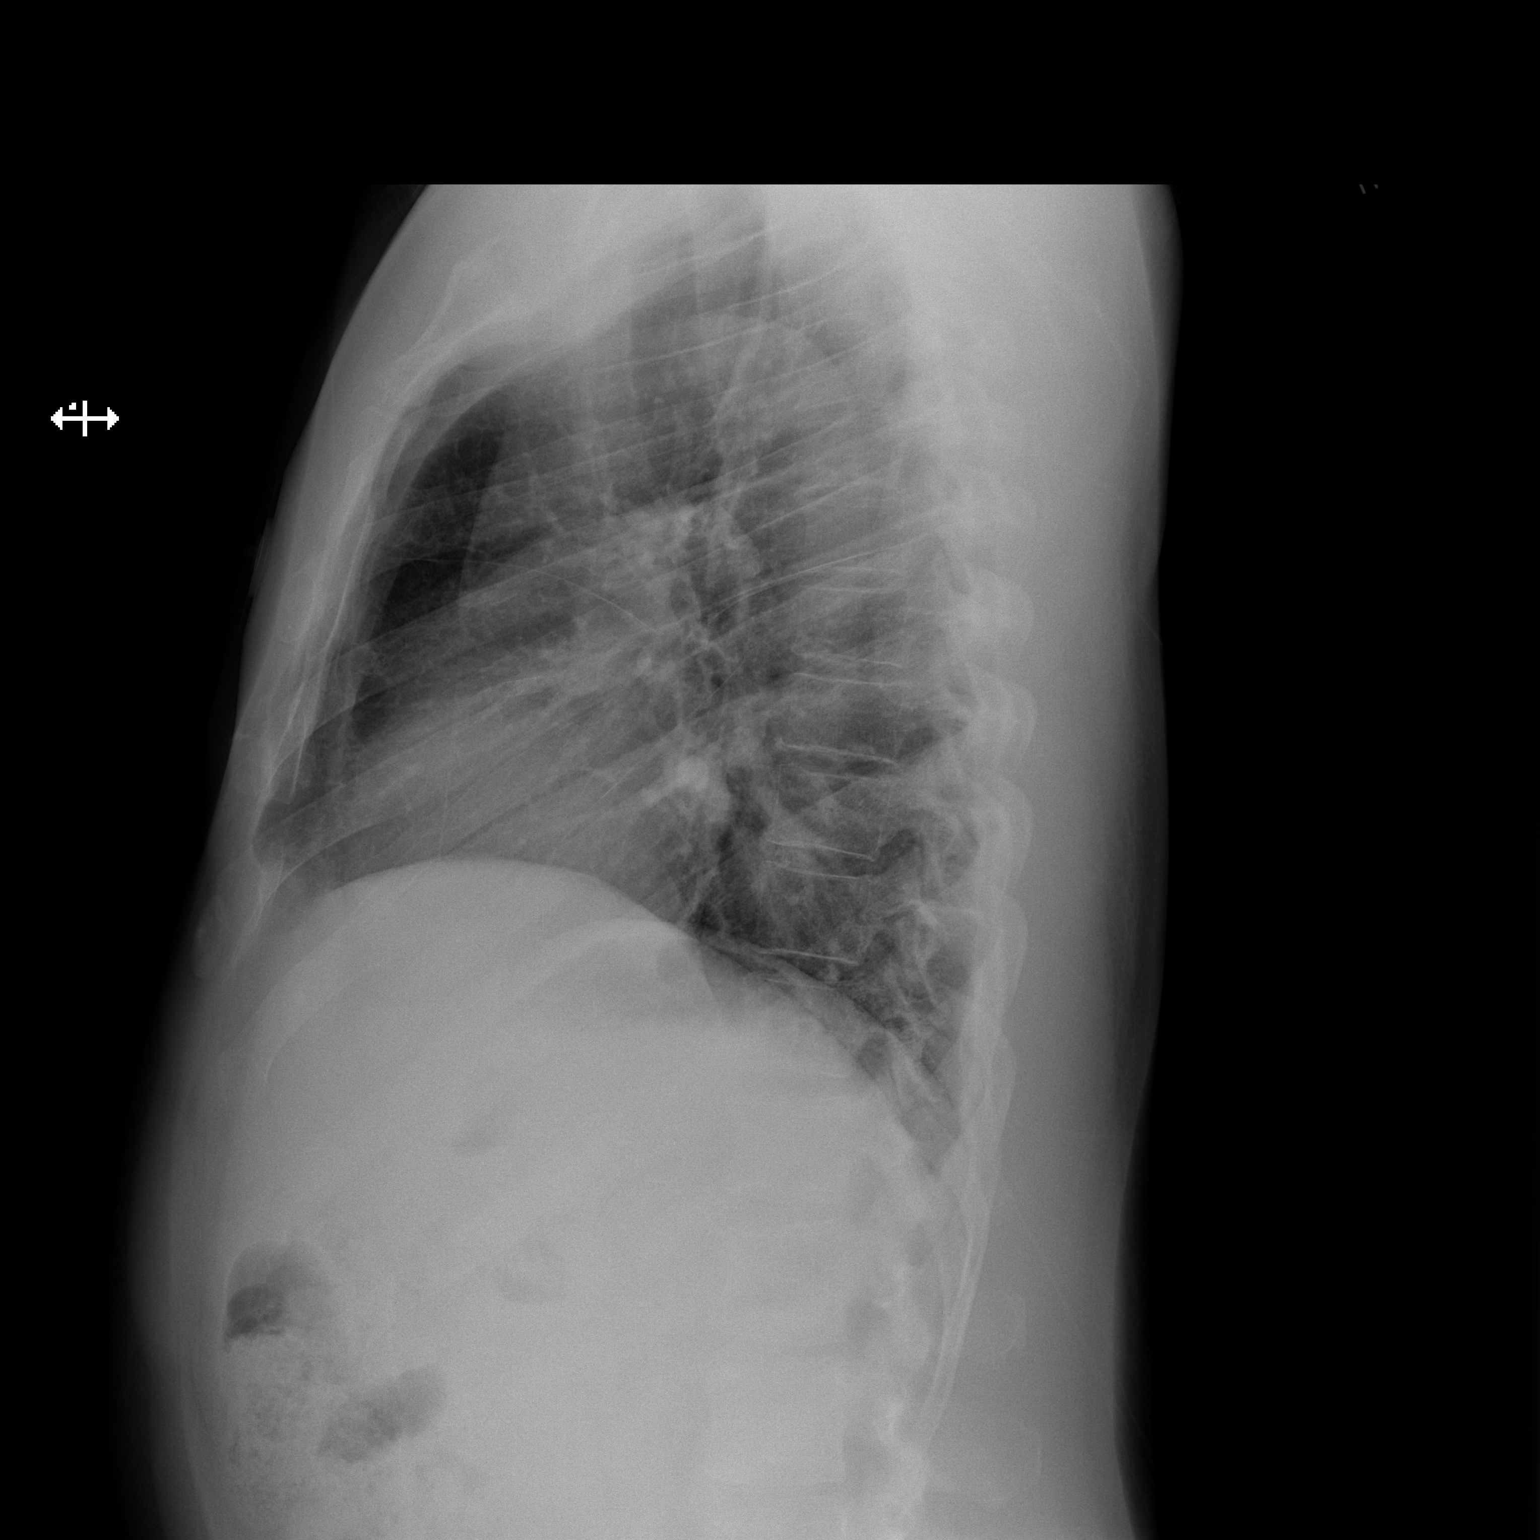

[2 of 2 positions shown; findings below may reference images not displayed]

FINDINGS: The heart size and mediastinal contours are within normal limits.
Both lungs are clear. No pleural effusion or pneumothorax. The
visualized skeletal structures are unremarkable.
IMPRESSION: No acute process in the chest.

## 2020-12-22 MED ORDER — ATORVASTATIN CALCIUM 80 MG PO TABS
80.0000 mg | ORAL_TABLET | Freq: Every day | ORAL | Status: DC
  Administered 2020-12-22 – 2020-12-25 (×4): 80 mg via ORAL
  Filled 2020-12-22 (×4): qty 1

## 2020-12-22 MED ORDER — NITROGLYCERIN 0.4 MG SL SUBL: 0.4000 mg | SUBLINGUAL_TABLET | SUBLINGUAL | Status: DC | PRN

## 2020-12-22 MED ORDER — HEPARIN (PORCINE) 25000 UT/250ML-% IV SOLN
900.0000 [IU]/h | INTRAVENOUS | Status: DC
  Administered 2020-12-22 – 2020-12-23 (×2): 900 [IU]/h via INTRAVENOUS
  Filled 2020-12-22 (×2): qty 250

## 2020-12-22 MED ORDER — SODIUM CHLORIDE 0.9% FLUSH
3.0000 mL | Freq: Two times a day (BID) | INTRAVENOUS | Status: DC
  Administered 2020-12-22 – 2020-12-24 (×3): 3 mL via INTRAVENOUS

## 2020-12-22 MED ORDER — ASPIRIN 81 MG PO CHEW
324.0000 mg | CHEWABLE_TABLET | Freq: Once | ORAL | Status: AC
  Administered 2020-12-22: 324 mg via ORAL
  Filled 2020-12-22: qty 4

## 2020-12-22 MED ORDER — ONDANSETRON HCL 4 MG/2ML IJ SOLN: 4.0000 mg | Freq: Four times a day (QID) | INTRAMUSCULAR | Status: DC | PRN

## 2020-12-22 MED ORDER — SODIUM CHLORIDE 0.9 % WEIGHT BASED INFUSION: 1.0000 mL/kg/h | INTRAVENOUS | Status: DC

## 2020-12-22 MED ORDER — CLOPIDOGREL BISULFATE 300 MG PO TABS
300.0000 mg | ORAL_TABLET | Freq: Once | ORAL | Status: AC
  Administered 2020-12-23: 300 mg via ORAL
  Filled 2020-12-22: qty 1

## 2020-12-22 MED ORDER — ASPIRIN 300 MG RE SUPP: 300.0000 mg | RECTAL | Status: AC

## 2020-12-22 MED ORDER — SODIUM CHLORIDE 0.9 % IV SOLN: 250.0000 mL | INTRAVENOUS | Status: DC | PRN

## 2020-12-22 MED ORDER — ASPIRIN EC 81 MG PO TBEC
81.0000 mg | DELAYED_RELEASE_TABLET | Freq: Every day | ORAL | Status: DC
  Administered 2020-12-24 – 2020-12-25 (×2): 81 mg via ORAL
  Filled 2020-12-22 (×2): qty 1

## 2020-12-22 MED ORDER — INSULIN ASPART 100 UNIT/ML ~~LOC~~ SOLN
0.0000 [IU] | Freq: Three times a day (TID) | SUBCUTANEOUS | Status: DC
  Administered 2020-12-23: 2 [IU] via SUBCUTANEOUS
  Administered 2020-12-24: 1 [IU] via SUBCUTANEOUS
  Administered 2020-12-24: 3 [IU] via SUBCUTANEOUS
  Administered 2020-12-24: 2 [IU] via SUBCUTANEOUS
  Administered 2020-12-25: 3 [IU] via SUBCUTANEOUS

## 2020-12-22 MED ORDER — SODIUM CHLORIDE 0.9% FLUSH: 3.0000 mL | INTRAVENOUS | Status: DC | PRN

## 2020-12-22 MED ORDER — CLOPIDOGREL BISULFATE 75 MG PO TABS: 75.0000 mg | ORAL_TABLET | Freq: Every day | ORAL | Status: DC

## 2020-12-22 MED ORDER — ACETAMINOPHEN 325 MG PO TABS
650.0000 mg | ORAL_TABLET | ORAL | Status: DC | PRN
  Administered 2020-12-23 – 2020-12-24 (×3): 650 mg via ORAL
  Filled 2020-12-22 (×3): qty 2

## 2020-12-22 MED ORDER — ASPIRIN 81 MG PO CHEW
81.0000 mg | CHEWABLE_TABLET | ORAL | Status: AC
  Administered 2020-12-23: 81 mg via ORAL
  Filled 2020-12-22: qty 1

## 2020-12-22 MED ORDER — METOPROLOL TARTRATE 12.5 MG HALF TABLET
12.5000 mg | ORAL_TABLET | Freq: Two times a day (BID) | ORAL | Status: DC
  Administered 2020-12-22 – 2020-12-25 (×6): 12.5 mg via ORAL
  Filled 2020-12-22 (×7): qty 1

## 2020-12-22 MED ORDER — ASPIRIN 81 MG PO CHEW
324.0000 mg | CHEWABLE_TABLET | ORAL | Status: AC
  Administered 2020-12-22: 324 mg via ORAL
  Filled 2020-12-22: qty 4

## 2020-12-22 MED ORDER — ASPIRIN EC 81 MG PO TBEC: 81.0000 mg | DELAYED_RELEASE_TABLET | Freq: Every day | ORAL | Status: DC

## 2020-12-22 MED ORDER — CLOPIDOGREL BISULFATE 75 MG PO TABS
75.0000 mg | ORAL_TABLET | Freq: Every day | ORAL | Status: DC
  Administered 2020-12-24 – 2020-12-25 (×2): 75 mg via ORAL
  Filled 2020-12-22 (×2): qty 1

## 2020-12-22 MED ORDER — NITROGLYCERIN IN D5W 200-5 MCG/ML-% IV SOLN
0.0000 ug/min | INTRAVENOUS | Status: DC
  Administered 2020-12-22: 5 ug/min via INTRAVENOUS
  Filled 2020-12-22: qty 250

## 2020-12-22 MED ORDER — SODIUM CHLORIDE 0.9 % IV SOLN: INTRAVENOUS | Status: DC

## 2020-12-22 MED ORDER — GABAPENTIN 300 MG PO CAPS
300.0000 mg | ORAL_CAPSULE | Freq: Three times a day (TID) | ORAL | Status: DC
Start: 2020-12-22 — End: 2020-12-25
  Administered 2020-12-22 – 2020-12-25 (×9): 300 mg via ORAL
  Filled 2020-12-22 (×9): qty 1

## 2020-12-22 MED ORDER — INSULIN GLARGINE 100 UNIT/ML ~~LOC~~ SOLN
30.0000 [IU] | Freq: Every day | SUBCUTANEOUS | Status: DC
  Administered 2020-12-22 – 2020-12-24 (×3): 30 [IU] via SUBCUTANEOUS
  Filled 2020-12-22 (×4): qty 0.3

## 2020-12-22 MED ORDER — HEPARIN BOLUS VIA INFUSION
4000.0000 [IU] | Freq: Once | INTRAVENOUS | Status: AC
  Administered 2020-12-22: 4000 [IU] via INTRAVENOUS
  Filled 2020-12-22: qty 4000

## 2020-12-22 NOTE — Progress Notes (Signed)
ANTICOAGULATION CONSULT NOTE - Follow Up Consult  Pharmacy Consult for Heparin Indication: chest pain/ACS  Allergies  Allergen Reactions  . Tramadol Nausea And Vomiting    Patient Measurements: Height: 5\' 9"  (175.3 cm) Weight: 76.8 kg (169 lb 5 oz) IBW/kg (Calculated) : 70.7 Heparin Dosing Weight: 74.8 kg  Vital Signs: Temp: 98 F (36.7 C) (02/17 2042) Temp Source: Oral (02/17 2042) BP: 121/86 (02/17 2042) Pulse Rate: 70 (02/17 2042)  Labs: Recent Labs    12/22/20 0730 12/22/20 0942 12/22/20 1430 12/22/20 1721 12/22/20 2118  HGB 14.1  --   --   --   --   HCT 44.1  --   --   --   --   PLT 274  --   --   --   --   HEPARINUNFRC  --   --   --   --  0.37  CREATININE 1.22  --   --   --   --   TROPONINIHS 22* 52* 111* 130*  --     Estimated Creatinine Clearance: 64.4 mL/min (by C-G formula based on SCr of 1.22 mg/dL).   Medical History: Past Medical History:  Diagnosis Date  . Anxiety   . Arthritis    shoulders  . Coronary artery disease   . Diabetes mellitus without complication ([place])   . H/O heart artery stent    x1  . High cholesterol   . Hypertension     Assessment: 60 yo M presents with CP. Patient is currently on IV heparin at 900 units/hr. Initial HL is therapeutic.   Goal of Therapy:  Heparin level 0.3-0.7 units/ml Monitor platelets by anticoagulation protocol: Yes   Plan:  Continue heparin infusion at 900 units/hr F/u AM level  Monitor daily HL, CBC, s/sx bleeding LHC scheduled for tomorrow   Albertina Parr, PharmD., BCPS, BCCCP Clinical Pharmacist Please refer to [facility] for unit-specific pharmacist

## 2020-12-22 NOTE — ED Provider Notes (Signed)
[facility] EMERGENCY DEPARTMENT Provider Note   CSN: [id number] Arrival date & time: 12/22/20  6219     History Chief Complaint  Patient presents with  . Chest Pain    Dean Robertson is a 60 y.o. male.  HPI Patient presents with chest and neck pain.  Began on Saturday or _0 12- normal   . CORONARY ANGIOPLASTY WITH STENT PLACEMENT         Family History  Problem Relation Age of Onset  . Hypertension Mother   . Diabetes Father   . Colon polyps Neg Hx   . Colon cancer Neg Hx   . Esophageal cancer Neg Hx   . Rectal cancer Neg Hx   . Stomach cancer Neg Hx     Social History   Tobacco Use  . Smoking status: Former Research scientist (life sciences)  . Smokeless tobacco: Never Used  Substance Use Topics  .  Alcohol use: Yes    Comment: occ  . Drug use: Not Currently    Home Medications Prior to Admission medications   Medication Sig Start Date End Date Taking? Authorizing Provider  acetaminophen (TYLENOL) 500 MG tablet Take 1 tablet (500 mg total) by mouth every 6 (six) hours as needed. 12/08/17   Dorena Dew, FNP  aspirin EC 81 MG tablet Take 81 mg by mouth daily.    [provider]  Blood Glucose Monitoring Suppl (TRUE METRIX AIR GLUCOSE METER) w/Device KIT 1 each by Does not apply route 3 (three) times daily. 10/27/20   Vevelyn Francois, NP  busPIRone (BUSPAR) 10 MG tablet Take 1 tablet (10 mg total) by mouth 2 (two) times daily. 10/27/20   Vevelyn Francois, NP  cyclobenzaprine (FLEXERIL) 5 MG tablet Take 1 tablet (5 mg total) by mouth 3 (three) times daily as needed for muscle spasms. 10/27/20   Vevelyn Francois, NP  diclofenac Sodium (VOLTAREN) 1 % GEL Apply 2 g topically 4 (four) times daily. 08/24/20   Jessy Oto, MD  gabapentin (NEURONTIN) 300 MG capsule TAKE 1 CAPSULE(300 MG) BY MOUTH THREE TIMES DAILY 05/06/20   Azzie Glatter,  FNP  glucose blood (TRUE METRIX BLOOD GLUCOSE TEST) test strip Use as instructed 10/27/20   Vevelyn Francois, NP  insulin glargine (LANTUS SOLOSTAR) 100 UNIT/ML Solostar Pen Inject 30 Units into the skin 2 (two) times daily for 7 days, THEN 40 Units 2 (two) times daily for 7 days, THEN 50 Units 2 (two) times daily for 7 days. 10/27/20 11/17/20  Vevelyn Francois, NP  Lancets MISC 1 each 3 (three) times daily by Does not apply route. 09/19/17   Dorena Dew, FNP  lisinopril (ZESTRIL) 20 MG tablet TAKE 1 TABLET(20 MG) BY MOUTH DAILY 10/27/20   Vevelyn Francois, NP  meloxicam (MOBIC) 15 MG tablet TAKE 1 TABLET(15 MG) BY MOUTH DAILY 09/20/20   Jessy Oto, MD  Menthol, Topical Analgesic, (BIOFREEZE) 4 % GEL Apply 1 application topically 4 (four) times daily as needed. 05/17/20   Azzie Glatter, FNP  metFORMIN (GLUCOPHAGE) 1000 MG tablet Take 1  tablet (1,000 mg total) by mouth 2 (two) times daily with a meal. 10/27/20   Vevelyn Francois, NP  methocarbamol (ROBAXIN) 500 MG tablet Take 500 mg by mouth 2 (two) times daily. 11/09/20   [provider]  Multiple Vitamins-Minerals (VITRUM SENIOR) TABS Take by mouth.    [provider]  naproxen (NAPROSYN) 500 MG tablet Take 1 tablet (500 mg total) by mouth 2 (two) times daily. 12/13/20   Hazel Sams, PA-C  propranolol (INDERAL) 40 MG tablet Take 1 tablet (40 mg total) by mouth 2 (two) times daily. As needed for anxiety 10/27/20   Vevelyn Francois, NP  sildenafil (VIAGRA) 25 MG tablet Take 1 tablet (25 mg total) by mouth daily as needed for erectile dysfunction. 11/10/20   Vevelyn Francois, NP  simvastatin (ZOCOR) 20 MG tablet Take 1 tablet (20 mg total) by mouth daily at 6 PM. 10/27/20   Vevelyn Francois, NP  tiZANidine (ZANAFLEX) 4 MG tablet Take 1 tablet (4 mg total) by mouth every 6 (six) hours as needed for muscle spasms. 12/13/20   Hazel Sams, PA-C    Allergies    Tramadol  Review of Systems   Review of Systems  Constitutional: Negative for appetite change.  HENT: Positive for sore throat.   Respiratory: Positive for cough and shortness of breath.   Cardiovascular: Positive for chest pain.  Gastrointestinal: Negative for abdominal pain and nausea.  Genitourinary: Negative for flank pain.  Musculoskeletal: Negative for back pain.  Skin: Negative for rash.  Neurological: Negative for weakness.  Psychiatric/Behavioral: Negative for confusion.    Physical Exam Updated Vital Signs BP 128/84   Pulse (!) 54   Temp 97.7 F (36.5 C) (Oral)   Resp 15   Ht _0  (1.702 m)   Wt 74.8 kg   SpO2 100%   BMI 25.83 kg/m   Physical Exam Vitals and nursing note reviewed.  Constitutional:      Appearance: He is well-developed.  HENT:     Head: Normocephalic.     Comments: Posterior pharynx no erythema or exudate. Cardiovascular:     Rate and Rhythm: Normal rate and  regular rhythm.  Pulmonary:     Breath sounds: No wheezing or rhonchi.  Chest:     Chest wall: No tenderness.  Abdominal:     Tenderness: There is no abdominal tenderness.  Musculoskeletal:     Cervical back: Neck supple.     Right lower leg: No edema.     Left lower leg: No  edema.  Skin:    Capillary Refill: Capillary refill takes less than 2 seconds.  Neurological:     Mental Status: He is alert and oriented to person, place, and time.     ED Results / Procedures / Treatments   Labs (all labs ordered are listed, but only abnormal results are displayed) Labs Reviewed  BASIC METABOLIC PANEL - Abnormal; Notable for the following components:      Result Value   Glucose, Bld 168 (*)    All other components within normal limits  TROPONIN I (HIGH SENSITIVITY) - Abnormal; Notable for the following components:   Troponin I (High Sensitivity) 22 (*)    All other components within normal limits  TROPONIN I (HIGH SENSITIVITY) - Abnormal; Notable for the following components:   Troponin I (High Sensitivity) 52 (*)    All other components within normal limits  RESP PANEL BY RT-PCR (FLU A&B, COVID) [id number]  CBC    EKG EKG Interpretation  Date/Time:  Thursday December 22 2020 07:12:33 EST Ventricular Rate:  59 PR Interval:  130 QRS Duration: 86 QT Interval:  406 QTC Calculation: 401 R Axis:   65 Text Interpretation: Sinus bradycardia T wave abnormality, consider inferior ischemia Abnormal ECG No significant change since last tracing Confirmed by Davonna Belling [phone]) on 12/22/2020 9:14:44 AM   Radiology DG Chest 2 View  Result Date: 12/22/2020 CLINICAL DATA:  Chest pain EXAM: CHEST - 2 VIEW COMPARISON:  09/17/2019 FINDINGS: The heart size and mediastinal contours are within normal limits. Both lungs are clear. No pleural effusion or pneumothorax. The visualized skeletal structures are unremarkable. IMPRESSION: No acute process in the chest. Electronically Signed   By: Macy Mis M.D.   On: 12/22/2020 08:06    Procedures Procedures   Medications Ordered in ED Medications  nitroGLYCERIN 50 mg in dextrose 5 % 250 mL (0.2 mg/mL) infusion (has no administration in time range)  aspirin chewable tablet 324 mg (324 mg Oral Given 12/22/20 1203)    ED Course  I have reviewed the triage vital signs and the nursing notes.  Pertinent labs & imaging results that were available during my care of the patient were reviewed by me and considered in my medical decision making (see chart for details).    MDM Rules/Calculators/A&P                          Patient with chest pain.  Has pain in his neck that goes to his chest.  Comes and goes.  Worse in the mornings.  Not associated with exertion.  EKG stable with some changes.  Has had previous stent.  However troponin is elevating.  Initially 20 then up to 50.  Pain-free now.  Has had aspirin.  Chest x-ray reassuring.  Negative Covid.  Has previous stent but that was decade ago and has not seen a cardiologist here.  Discussed with Dr. Terrence Dupont, who will see patient.  Admit to hospital.  Started on heparin nitro.  CRITICAL CARE Performed by: Davonna Belling Total critical care time: 30 minutes Critical care time was exclusive of separately billable procedures and treating other patients. Critical care was necessary to treat or prevent imminent or life-threatening deterioration. Critical care was time spent personally by me on the following activities: development of treatment plan with patient and/or surrogate as well as nursing, discussions with consultants, evaluation of patient's response to treatment, examination of patient, obtaining history from patient or surrogate, ordering and  performing treatments and interventions, ordering and review of laboratory studies, ordering and review of radiographic studies, pulse oximetry and re-evaluation of patient's condition.  Final Clinical Impression(s) / ED Diagnoses Final  diagnoses:  Chest pain, unspecified type  Elevated troponin    Rx / DC Orders ED Discharge Orders    None       Davonna Belling, MD 12/22/20 1254

## 2020-12-22 NOTE — ED Notes (Addendum)
Happy meal with cheese, crackers, pb and graham crackers provided.  Heart healthy diet ordered.  Overdue meds to be given after meal.

## 2020-12-22 NOTE — Progress Notes (Signed)
ANTICOAGULATION CONSULT NOTE - Initial Consult  Pharmacy Consult for Heparin Indication: chest pain/ACS  Allergies  Allergen Reactions  . Tramadol Nausea And Vomiting    Patient Measurements: Height: 5\' 7"  (170.2 cm) Weight: 74.8 kg (164 lb 14.5 oz) IBW/kg (Calculated) : 66.1 Heparin Dosing Weight: 74.8 kg  Vital Signs: Temp: 97.7 F (36.5 C) (02/17 0932) Temp Source: Oral (02/17 0932) BP: 128/84 (02/17 1230) Pulse Rate: 54 (02/17 1230)  Labs: Recent Labs    12/22/20 0730 12/22/20 0942  HGB 14.1  --   HCT 44.1  --   PLT 274  --   CREATININE 1.22  --   TROPONINIHS 22* 52*    Estimated Creatinine Clearance: 60.2 mL/min (by C-G formula based on SCr of 1.22 mg/dL).   Medical History: Past Medical History:  Diagnosis Date  . Anxiety   . Arthritis    shoulders  . Coronary artery disease   . Diabetes mellitus without complication ([place])   . H/O heart artery stent    x1  . High cholesterol   . Hypertension     Assessment: 60 yo M presents with CP. Pharmacy asked to start IV heparin. No AC noted PTA. CBC wnl with Hgb 14.1, pltc 274.   Goal of Therapy:  Heparin level 0.3-0.7 units/ml Monitor platelets by anticoagulation protocol: Yes   Plan:  Give IV heparin bolus 400 units Start heparin infusion at 900 units/hr Check 6-hr HL Monitor daily HL, CBC, s/sx bleeding  Richardine Service, PharmD, BCPS PGY2 Cardiology Pharmacy Resident Phone: [phone] 12/22/2020  12:56 PM  Please check [email or web address] for unit-specific pharmacy phone numbers.

## 2020-12-22 NOTE — ED Triage Notes (Signed)
Patient complains of chest tightness 6/10 that started yesterday, reports some dyspnea, denies nausea and vomiting. States nothing makes pain feel better or worse.

## 2020-12-22 NOTE — H&P (Signed)
Dean Robertson is an 60 y.o. male.   Chief Complaint:chest pain OYD:Dean Robertson is 60 year old male with past medical history significant for coronary artery disease, history of PTCA stenting in 2011 at [facility], hypertension, insulin-requiring diabetes mellitus, hyperlipidemia, remote tobacco use, came to ER complaining of recurrent retrosternal chest pain described as burning for the last 2 days, radiating to throat last night woke up with chest pain described as burning, great, 6/10, relieved on its own.  Patient denies any exertional chest pain.  States has not used any nitroglycerin in last 10+ years and has not followed up with his cardiologist since his PCI.  Patient EKG done in the ED showed minor ST-T wave changes in inferior and anterior leads.  Patient was noted to have minimally elevated troponin I.  Presently patient denies any chest pain.  Past Medical History:  Diagnosis Date  . Anxiety   . Arthritis    shoulders  . Coronary artery disease   . Diabetes mellitus without complication ([place])   . H/O heart artery stent    x1  . High cholesterol   . Hypertension     Past Surgical History:  Procedure Laterality Date  . COLONOSCOPY     2012- normal   . CORONARY ANGIOPLASTY WITH STENT PLACEMENT      Family History  Problem Relation Age of Onset  . Hypertension Mother   . Diabetes Father   . Colon polyps Neg Hx   . Colon cancer Neg Hx   . Esophageal cancer Neg Hx   . Rectal cancer Neg Hx   . Stomach cancer Neg Hx    Social History:  reports that he has quit smoking. He has never used smokeless tobacco. He reports current alcohol use. He reports previous drug use.  Allergies:  Allergies  Allergen Reactions  . Tramadol Nausea And Vomiting    (Not in a hospital admission)   Results for orders placed or performed during the hospital encounter of 12/22/20 (from the past 48 hour(s))  Basic metabolic panel     Status: Abnormal   Collection Time: 12/22/20   7:30 AM  Result Value Ref Range   Sodium 140 135 - 145 mmol/L   Potassium 3.9 3.5 - 5.1 mmol/L   Chloride 103 98 - 111 mmol/L   CO2 27 22 - 32 mmol/L   Glucose, Bld 168 (H) 70 - 99 mg/dL    Comment: Glucose reference range applies only to samples taken after fasting for at least 8 hours.   BUN 17 6 - 20 mg/dL   Creatinine, Ser 1.22 0.61 - 1.24 mg/dL   Calcium 9.6 8.9 - 10.3 mg/dL   GFR, Estimated >60 >60 mL/min    Comment: (NOTE) Calculated using the CKD-EPI Creatinine Equation (2021)    Anion gap 10 5 - 15    Comment: Performed at [facility] [address] [place], [place] [address]  CBC     Status: None   Collection Time: 12/22/20  7:30 AM  Result Value Ref Range   WBC 4.7 4.0 - 10.5 K/uL   RBC 4.84 4.22 - 5.81 MIL/uL   Hemoglobin 14.1 13.0 - 17.0 g/dL   HCT 44.1 39.0 - 52.0 %   MCV 91.1 80.0 - 100.0 fL   MCH 29.1 26.0 - 34.0 pg   MCHC 32.0 30.0 - 36.0 g/dL   RDW 12.1 11.5 - 15.5 %   Platelets 274 150 - 400 K/uL   nRBC 0.0 0.0 -  0.2 %    Comment: Performed at [facility], [place] [address], [place], [place] [address]  Troponin I (High Sensitivity)     Status: Abnormal   Collection Time: 12/22/20  7:30 AM  Result Value Ref Range   Troponin I (High Sensitivity) 22 (H) <18 ng/L    Comment: (NOTE) Elevated high sensitivity troponin I (hsTnI) values and significant  changes across serial measurements may suggest ACS but many other  chronic and acute conditions are known to elevate hsTnI results.  Refer to the "Links" section for chest pain algorithms and additional  guidance. Performed at [facility], [place] [address], [place], [place] [address]   Troponin I (High Sensitivity)     Status: Abnormal   Collection Time: 12/22/20  9:42 AM  Result Value Ref Range   Troponin I (High Sensitivity) 52 (H) <18 ng/L    Comment: RESULT CALLED TO, READ BACK BY AND VERIFIED WITH: BANKS,E RN @1111  ON [id number] BY FLEMINGS (NOTE) Elevated high sensitivity troponin I  (hsTnI) values and significant  changes across serial measurements may suggest ACS but many other  chronic and acute conditions are known to elevate hsTnI results.  Refer to the Links section for chest pain algorithms and additional  guidance. Performed at [facility], [place] [address] [place], [place] [address]   Resp Panel by RT-PCR (Flu A&B, Covid) Nasopharyngeal Swab     Status: None   Collection Time: 12/22/20  9:47 AM   Specimen: Nasopharyngeal Swab; Nasopharyngeal(NP) swabs in vial transport medium  Result Value Ref Range   SARS Coronavirus 2 by RT PCR NEGATIVE NEGATIVE    Comment: (NOTE) SARS-CoV-2 target nucleic acids are NOT DETECTED.  The SARS-CoV-2 RNA is generally detectable in upper respiratory specimens during the acute phase of infection. The lowest concentration of SARS-CoV-2 viral copies this assay can detect is 138 copies/mL. A negative result does not preclude SARS-Cov-2 infection and should not be used as the sole basis for treatment or other patient management decisions. A negative result may occur with  improper specimen collection/handling, submission of specimen other than nasopharyngeal swab, presence of viral mutation(s) within the areas targeted by this assay, and inadequate number of viral copies(<138 copies/mL). A negative result must be combined with clinical observations, patient history, and epidemiological information. The expected result is Negative.  Fact Sheet for Patients:  EntrepreneurPulse.com.au  Fact Sheet for Healthcare Providers:  IncredibleEmployment.be  This test is no t yet approved or cleared by the [place] FDA and  has been authorized for detection and/or diagnosis of SARS-CoV-2 by FDA under an Emergency Use Authorization (EUA). This EUA will remain  in effect (meaning this test can be used) for the duration of the COVID-19 declaration under Section 564(b)(1) of the Act,  21 U.S.C.section 360bbb-3(b)(1), unless the authorization is terminated  or revoked sooner.       Influenza A by PCR NEGATIVE NEGATIVE   Influenza B by PCR NEGATIVE NEGATIVE    Comment: (NOTE) The Xpert Xpress SARS-CoV-2/FLU/RSV plus assay is intended as an aid in the diagnosis of influenza from Nasopharyngeal swab specimens and should not be used as a sole basis for treatment. Nasal washings and aspirates are unacceptable for Xpert Xpress SARS-CoV-2/FLU/RSV testing.  Fact Sheet for Patients: EntrepreneurPulse.com.au  Fact Sheet for Healthcare Providers: IncredibleEmployment.be  This test is not yet approved or cleared by the [place] FDA and has been authorized for detection and/or diagnosis of SARS-CoV-2 by FDA under an Emergency  Use Authorization (EUA). This EUA will remain in effect (meaning this test can be used) for the duration of the COVID-19 declaration under Section 564(b)(1) of the Act, 21 U.S.C. section 360bbb-3(b)(1), unless the authorization is terminated or revoked.  Performed at [facility], [place] [address], [place], [place] [address]    DG Chest 2 View  Result Date: 12/22/2020 CLINICAL DATA:  Chest pain EXAM: CHEST - 2 VIEW COMPARISON:  09/17/2019 FINDINGS: The heart size and mediastinal contours are within normal limits. Both lungs are clear. No pleural effusion or pneumothorax. The visualized skeletal structures are unremarkable. IMPRESSION: No acute process in the chest. Electronically Signed   By: Macy Mis M.D.   On: 12/22/2020 08:06    Review of Systems  Constitutional: Negative for diaphoresis and fatigue.  HENT: Negative for sore throat.   Eyes: Negative for discharge.  Respiratory: Negative for shortness of breath.   Cardiovascular: Positive for chest pain. Negative for palpitations.  Gastrointestinal: Negative for abdominal distention and abdominal pain.  Genitourinary: Negative for difficulty  urinating.  Neurological: Negative for dizziness.    Blood pressure 137/89, pulse (!) 57, temperature 97.7 F (36.5 C), temperature source Oral, resp. rate 15, height 5\' 7"  (1.702 m), weight 74.8 kg, SpO2 100 %. Physical Exam Constitutional:      Appearance: He is well-developed.  HENT:     Head: Normocephalic and atraumatic.  Eyes:     Extraocular Movements: Extraocular movements intact.  Neck:     Thyroid: No thyromegaly.     Vascular: No JVD.  Cardiovascular:     Rate and Rhythm: Normal rate and regular rhythm.     Heart sounds: Murmur heard.   Systolic (soft systolic murmur noted.  No S3 gallop) murmur is present.   Pulmonary:     Breath sounds: Normal breath sounds.  Abdominal:     General: Bowel sounds are normal.     Palpations: Abdomen is soft.  Musculoskeletal:        General: Normal range of motion.     Cervical back: Normal range of motion and neck supple.     Right lower leg: No tenderness. No edema.     Left lower leg: No tenderness. No edema.  Lymphadenopathy:     Cervical: No cervical adenopathy.  Skin:    General: Skin is warm and dry.  Neurological:     General: No focal deficit present.     Mental Status: He is oriented to person, place, and time.      Assessment/Plan Acute  Coronary syndrome. Coronary artery disease, status post PCI in remote past. Hypertension. Type 2 diabetes mellitus. Hyperlipidemia. Remote tobacco abuse. History of covid 19 infection in the past Plan As per orders. Discussed with patient regarding Minor EKG changes and minimally elevated high-sensitivity  Troponin I and various options of treatment, I.e., noninvasive stress testing versus left cardiac catheterization, possible PTCA stenting.it's risk and benefits, wants to proceed with noninvasive stress testing first. Charolette Forward, MD 12/22/2020, 1:35 PM

## 2020-12-22 NOTE — ED Notes (Signed)
Dr. Terrence Dupont notified by phone of elevated Troponin of 111.  We also discussed tele vs. Progressive in light of nitro drip.  He states if pt is CP free and nitro does not need to be titrated, he can go to tele floor.

## 2020-12-23 ENCOUNTER — Encounter (HOSPITAL_COMMUNITY)
Admission: EM | Disposition: A | Discharge status: Home / Self Care | Payer: Self-pay | Source: Home / Self Care | Attending: Cardiology

## 2020-12-23 ENCOUNTER — Encounter (HOSPITAL_COMMUNITY): Payer: Self-pay | Admitting: Cardiology

## 2020-12-23 DIAGNOSIS — I214 Non-ST elevation (NSTEMI) myocardial infarction: Principal | ICD-10-CM

## 2020-12-23 DIAGNOSIS — R079 Chest pain, unspecified: Secondary | ICD-10-CM | POA: Diagnosis present

## 2020-12-23 DIAGNOSIS — E1165 Type 2 diabetes mellitus with hyperglycemia: Secondary | ICD-10-CM | POA: Diagnosis not present

## 2020-12-23 DIAGNOSIS — Z20822 Contact with and (suspected) exposure to covid-19: Secondary | ICD-10-CM | POA: Diagnosis not present

## 2020-12-23 DIAGNOSIS — E785 Hyperlipidemia, unspecified: Secondary | ICD-10-CM | POA: Diagnosis not present

## 2020-12-23 DIAGNOSIS — I2510 Atherosclerotic heart disease of native coronary artery without angina pectoris: Secondary | ICD-10-CM | POA: Diagnosis not present

## 2020-12-23 DIAGNOSIS — Z791 Long term (current) use of non-steroidal anti-inflammatories (NSAID): Secondary | ICD-10-CM | POA: Diagnosis not present

## 2020-12-23 DIAGNOSIS — Z885 Allergy status to narcotic agent status: Secondary | ICD-10-CM | POA: Diagnosis not present

## 2020-12-23 DIAGNOSIS — Z833 Family history of diabetes mellitus: Secondary | ICD-10-CM | POA: Diagnosis not present

## 2020-12-23 DIAGNOSIS — Z794 Long term (current) use of insulin: Secondary | ICD-10-CM | POA: Diagnosis not present

## 2020-12-23 DIAGNOSIS — Z79899 Other long term (current) drug therapy: Secondary | ICD-10-CM | POA: Diagnosis not present

## 2020-12-23 DIAGNOSIS — Z8616 Personal history of COVID-19: Secondary | ICD-10-CM | POA: Diagnosis not present

## 2020-12-23 DIAGNOSIS — E7800 Pure hypercholesterolemia, unspecified: Secondary | ICD-10-CM | POA: Diagnosis not present

## 2020-12-23 DIAGNOSIS — I10 Essential (primary) hypertension: Secondary | ICD-10-CM | POA: Diagnosis not present

## 2020-12-23 DIAGNOSIS — Z955 Presence of coronary angioplasty implant and graft: Secondary | ICD-10-CM | POA: Diagnosis not present

## 2020-12-23 DIAGNOSIS — Z87891 Personal history of nicotine dependence: Secondary | ICD-10-CM | POA: Diagnosis not present

## 2020-12-23 DIAGNOSIS — M1990 Unspecified osteoarthritis, unspecified site: Secondary | ICD-10-CM | POA: Diagnosis not present

## 2020-12-23 DIAGNOSIS — Z8249 Family history of ischemic heart disease and other diseases of the circulatory system: Secondary | ICD-10-CM | POA: Diagnosis not present

## 2020-12-23 DIAGNOSIS — Z7982 Long term (current) use of aspirin: Secondary | ICD-10-CM | POA: Diagnosis not present

## 2020-12-23 HISTORY — PX: CORONARY STENT INTERVENTION: CATH118234

## 2020-12-23 HISTORY — PX: LEFT HEART CATH AND CORONARY ANGIOGRAPHY: CATH118249

## 2020-12-23 LAB — GLUCOSE, CAPILLARY
Glucose-Capillary: 66 mg/dL — ABNORMAL LOW (ref 70–99)
Glucose-Capillary: 129 mg/dL — ABNORMAL HIGH (ref 70–99)
Glucose-Capillary: 69 mg/dL — ABNORMAL LOW (ref 70–99)
Glucose-Capillary: 174 mg/dL — ABNORMAL HIGH (ref 70–99)
Glucose-Capillary: 152 mg/dL — ABNORMAL HIGH (ref 70–99)
Glucose-Capillary: 190 mg/dL — ABNORMAL HIGH (ref 70–99)

## 2020-12-23 LAB — BASIC METABOLIC PANEL
Anion gap: 9 (ref 5–15)
BUN: 18 mg/dL (ref 6–20)
CO2: 26 mmol/L (ref 22–32)
Calcium: 9.1 mg/dL (ref 8.9–10.3)
Chloride: 103 mmol/L (ref 98–111)
Creatinine, Ser: 1.05 mg/dL (ref 0.61–1.24)
GFR, Estimated: >60 mL/min (ref >60)
Glucose, Bld: 133 mg/dL — ABNORMAL HIGH (ref 70–99)
Potassium: 4.2 mmol/L (ref 3.5–5.1)
Sodium: 138 mmol/L (ref 135–145)

## 2020-12-23 LAB — CBC
HCT: 39.6 % (ref 39.0–52.0)
Hemoglobin: 13.2 g/dL (ref 13.0–17.0)
MCH: 29.7 pg (ref 26.0–34.0)
MCHC: 33.3 g/dL (ref 30.0–36.0)
MCV: 89 fL (ref 80.0–100.0)
Platelets: 238 10*3/uL (ref 150–400)
RBC: 4.45 MIL/uL (ref 4.22–5.81)
RDW: 12 % (ref 11.5–15.5)
WBC: 5.6 10*3/uL (ref 4.0–10.5)
nRBC: 0 % (ref 0.0–0.2)

## 2020-12-23 LAB — HEPARIN LEVEL (UNFRACTIONATED): Heparin Unfractionated: 0.42 IU/mL (ref 0.30–0.70)

## 2020-12-23 LAB — LIPID PANEL
Cholesterol: 217 mg/dL — ABNORMAL HIGH (ref 0–200)
HDL: 57 mg/dL (ref >40)
LDL Cholesterol: 106 mg/dL — ABNORMAL HIGH (ref 0–99)
Total CHOL/HDL Ratio: 3.8 RATIO
Triglycerides: 270 mg/dL — ABNORMAL HIGH (ref <150)
VLDL: 54 mg/dL — ABNORMAL HIGH (ref 0–40)

## 2020-12-23 SURGERY — LEFT HEART CATH AND CORONARY ANGIOGRAPHY
Anesthesia: LOCAL

## 2020-12-23 MED ORDER — LIDOCAINE HCL (PF) 1 % IJ SOLN
INTRAMUSCULAR | Status: DC | PRN
  Administered 2020-12-23: 15 mL

## 2020-12-23 MED ORDER — IOHEXOL 350 MG/ML SOLN
INTRAVENOUS | Status: AC
  Filled 2020-12-23: qty 1

## 2020-12-23 MED ORDER — LABETALOL HCL 5 MG/ML IV SOLN: 10.0000 mg | INTRAVENOUS | Status: AC | PRN

## 2020-12-23 MED ORDER — HYDRALAZINE HCL 20 MG/ML IJ SOLN: 10.0000 mg | INTRAMUSCULAR | Status: AC | PRN

## 2020-12-23 MED ORDER — DEXTROSE 50 % IV SOLN
INTRAVENOUS | Status: AC
  Filled 2020-12-23: qty 50

## 2020-12-23 MED ORDER — SODIUM CHLORIDE 0.9 % IV SOLN
INTRAVENOUS | Status: DC | PRN
  Administered 2020-12-23: 1.75 mg/kg/h via INTRAVENOUS

## 2020-12-23 MED ORDER — FENTANYL CITRATE (PF) 100 MCG/2ML IJ SOLN
INTRAMUSCULAR | Status: AC
  Filled 2020-12-23: qty 2

## 2020-12-23 MED ORDER — SODIUM CHLORIDE 0.9% FLUSH: 3.0000 mL | INTRAVENOUS | Status: DC | PRN

## 2020-12-23 MED ORDER — HEPARIN (PORCINE) IN NACL 1000-0.9 UT/500ML-% IV SOLN
INTRAVENOUS | Status: DC | PRN
  Administered 2020-12-23 (×2): 500 mL

## 2020-12-23 MED ORDER — BIVALIRUDIN BOLUS VIA INFUSION - CUPID
INTRAVENOUS | Status: DC | PRN
  Administered 2020-12-23: 57.6 mg via INTRAVENOUS

## 2020-12-23 MED ORDER — SODIUM CHLORIDE 0.9% FLUSH
3.0000 mL | Freq: Two times a day (BID) | INTRAVENOUS | Status: DC
  Administered 2020-12-24 – 2020-12-25 (×4): 3 mL via INTRAVENOUS

## 2020-12-23 MED ORDER — DEXTROSE 50 % IV SOLN
INTRAVENOUS | Status: DC | PRN
  Administered 2020-12-23: 1 via INTRAVENOUS

## 2020-12-23 MED ORDER — IOHEXOL 350 MG/ML SOLN
INTRAVENOUS | Status: DC | PRN
  Administered 2020-12-23: 80 mL via INTRA_ARTERIAL

## 2020-12-23 MED ORDER — SODIUM CHLORIDE 0.9 % IV SOLN
250.0000 mL | INTRAVENOUS | Status: DC | PRN
Start: 2020-12-24 — End: 2020-12-25

## 2020-12-23 MED ORDER — MIDAZOLAM HCL 2 MG/2ML IJ SOLN
INTRAMUSCULAR | Status: DC | PRN
  Administered 2020-12-23: 1 mg via INTRAVENOUS

## 2020-12-23 MED ORDER — HEPARIN (PORCINE) IN NACL 1000-0.9 UT/500ML-% IV SOLN
INTRAVENOUS | Status: AC
  Filled 2020-12-23: qty 1000

## 2020-12-23 MED ORDER — NITROGLYCERIN 1 MG/10 ML FOR IR/CATH LAB
INTRA_ARTERIAL | Status: AC
  Filled 2020-12-23: qty 10

## 2020-12-23 MED ORDER — BIVALIRUDIN TRIFLUOROACETATE 250 MG IV SOLR
INTRAVENOUS | Status: AC
  Filled 2020-12-23: qty 250

## 2020-12-23 MED ORDER — SODIUM CHLORIDE 0.9 % WEIGHT BASED INFUSION: 1.0000 mL/kg/h | INTRAVENOUS | Status: AC

## 2020-12-23 MED ORDER — MIDAZOLAM HCL 2 MG/2ML IJ SOLN
INTRAMUSCULAR | Status: AC
  Filled 2020-12-23: qty 2

## 2020-12-23 MED ORDER — IOHEXOL 350 MG/ML SOLN
INTRAVENOUS | Status: DC | PRN
  Administered 2020-12-23: 75 mL

## 2020-12-23 MED ORDER — LIDOCAINE HCL (PF) 1 % IJ SOLN
INTRAMUSCULAR | Status: AC
  Filled 2020-12-23: qty 30

## 2020-12-23 MED ORDER — FENTANYL CITRATE (PF) 100 MCG/2ML IJ SOLN
INTRAMUSCULAR | Status: DC | PRN
  Administered 2020-12-23: 25 ug via INTRAVENOUS

## 2020-12-23 MED ORDER — NITROGLYCERIN 1 MG/10 ML FOR IR/CATH LAB
INTRA_ARTERIAL | Status: DC | PRN
  Administered 2020-12-23 (×3): 150 ug via INTRACORONARY

## 2020-12-23 MED ORDER — DEXTROSE 50 % IV SOLN
25.0000 mL | Freq: Once | INTRAVENOUS | Status: AC
  Administered 2020-12-23: 25 mL via INTRAVENOUS

## 2020-12-23 SURGICAL SUPPLY — 23 items
BALLN SAPPHIRE 2.0X15 (BALLOONS) ×2
BALLN SAPPHIRE 2.5X12 (BALLOONS) ×2
BALLN SAPPHIRE ~~LOC~~ 3.0X10 (BALLOONS) ×2 IMPLANT
BALLN ~~LOC~~ EMERGE MR 2.5X8 (BALLOONS) ×2
BALLOON SAPPHIRE 2.0X15 (BALLOONS) ×1 IMPLANT
BALLOON SAPPHIRE 2.5X12 (BALLOONS) ×1 IMPLANT
BALLOON ~~LOC~~ EMERGE MR 2.5X8 (BALLOONS) ×1 IMPLANT
CATH INFINITI 5 FR 3DRC (CATHETERS) ×2 IMPLANT
CATH INFINITI 5FR MULTPACK ANG (CATHETERS) ×2 IMPLANT
CATH VISTA GUIDE 6FR XBLAD3.5 (CATHETERS) ×2 IMPLANT
CLOSURE PERCLOSE PROSTYLE (VASCULAR PRODUCTS) ×2 IMPLANT
KIT ENCORE 26 ADVANTAGE (KITS) ×2 IMPLANT
KIT HEART LEFT (KITS) ×2 IMPLANT
PACK CARDIAC CATHETERIZATION (CUSTOM PROCEDURE TRAY) ×2 IMPLANT
SHEATH PINNACLE 5F 10CM (SHEATH) ×2 IMPLANT
SHEATH PINNACLE 6F 10CM (SHEATH) ×2 IMPLANT
STENT RESOLUTE ONYX 2.25X15 (Permanent Stent) ×2 IMPLANT
STENT RESOLUTE ONYX 2.75X18 (Permanent Stent) ×2 IMPLANT
SYR MEDRAD MARK 7 150ML (SYRINGE) ×2 IMPLANT
TRANSDUCER W/STOPCOCK (MISCELLANEOUS) ×2 IMPLANT
WIRE COUGAR XT STRL 190CM (WIRE) ×2 IMPLANT
WIRE EMERALD 3MM-J .035X150CM (WIRE) ×2 IMPLANT
WIRE HI TORQ BMW 190CM (WIRE) ×2 IMPLANT

## 2020-12-23 NOTE — Plan of Care (Signed)

## 2020-12-23 NOTE — Plan of Care (Signed)
  Problem: Elimination: Goal: Will not experience complications related to bowel motility 12/23/2020 1306 by Shanon Ace, RN Outcome: Progressing 12/23/2020 1304 by Shanon Ace, RN Outcome: Progressing Goal: Will not experience complications related to urinary retention 12/23/2020 1306 by Shanon Ace, RN Outcome: Progressing 12/23/2020 1304 by Shanon Ace, RN Outcome: Progressing   Problem: Pain Managment: Goal: General experience of comfort will improve 12/23/2020 1306 by Shanon Ace, RN Outcome: Progressing 12/23/2020 1304 by Shanon Ace, RN Outcome: Progressing   Problem: Safety: Goal: Ability to remain free from injury will improve 12/23/2020 1306 by Shanon Ace, RN Outcome: Progressing 12/23/2020 1304 by Shanon Ace, RN Outcome: Progressing   Problem: Skin Integrity: Goal: Risk for impaired skin integrity will decrease 12/23/2020 1306 by Shanon Ace, RN Outcome: Progressing 12/23/2020 1304 by Shanon Ace, RN Outcome: Progressing   Problem: Education: Goal: Understanding of CV disease, CV risk reduction, and recovery process will improve Outcome: Progressing Goal: Individualized Educational Video(s) Outcome: Progressing   Problem: Activity: Goal: Ability to return to baseline activity level will improve Outcome: Progressing   Problem: Cardiovascular: Goal: Ability to achieve and maintain adequate cardiovascular perfusion will improve Outcome: Progressing Goal: Vascular access site(s) Level 0-1 will be maintained Outcome: Progressing   Problem: Health Behavior/Discharge Planning: Goal: Ability to safely manage health-related needs after discharge will improve Outcome: Progressing

## 2020-12-23 NOTE — Progress Notes (Signed)
Hypoglycemic Event  CBG: 66 (06:24 AM)  Treatment: D50 25 mL (12.5 gm)  Symptoms: Shaky  Follow-up CBG: Time:06:52AM CBG Result:129  Possible Reasons for Event: Unknown

## 2020-12-23 NOTE — Progress Notes (Signed)
Subjective:  Patient denies any chest pain or shortness of breath discussed with patient yesterday and again today regarding minimally aggressive increasing high-sensitivity troponin high and left cardiac catheterization possible PTCA stenting its risk and benefits and consents for PCI  Objective:  Vital Signs in the last 24 hours: Temp:  [97.6 F (36.4 C)-98 F (36.7 C)] 97.7 F (36.5 C) (02/18 0830) Pulse Rate:  [54-77] 55 (02/18 0834) Resp:  [12-20] 12 (02/18 0353) BP: (114-142)/(71-94) 115/71 (02/18 0834) SpO2:  [96 %-100 %] 97 % (02/18 0353) Weight:  [74.8 kg-76.8 kg] 76.8 kg (02/17 2042)  Intake/Output from previous day: 02/17 0701 - 02/18 0700 In: 737.6 [I.V.:737.6] Out: 825 [Urine:825] Intake/Output from this shift: No intake/output data recorded.  Physical Exam: Exam unchanged  Lab Results: Recent Labs    12/22/20 0730 12/23/20 0019  WBC 4.7 5.6  HGB 14.1 13.2  PLT 274 238   Recent Labs    12/22/20 0730 12/23/20 0019  NA 140 138  K 3.9 4.2  CL 103 103  CO2 27 26  GLUCOSE 168* 133*  BUN 17 18  CREATININE 1.22 1.05   No results for input(s): TROPONINI in the last 72 hours.  Invalid input(s): CK, MB Hepatic Function Panel No results for input(s): PROT, ALBUMIN, AST, ALT, ALKPHOS, BILITOT, BILIDIR, IBILI in the last 72 hours. Recent Labs    12/23/20 0019  CHOL 217*   No results for input(s): PROTIME in the last 72 hours.  Imaging: Imaging results have been reviewed and DG Chest 2 View  Result Date: 12/22/2020 CLINICAL DATA:  Chest pain EXAM: CHEST - 2 VIEW COMPARISON:  09/17/2019 FINDINGS: The heart size and mediastinal contours are within normal limits. Both lungs are clear. No pleural effusion or pneumothorax. The visualized skeletal structures are unremarkable. IMPRESSION: No acute process in the chest. Electronically Signed   By: Macy Mis M.D.   On: 12/22/2020 08:06    Cardiac SAcute  Coronary syndrome. Coronary artery disease, status  post PCI in remote past. Hypertension. Type 2 diabetes mellitus. Hyperlipidemia. Remote tobacco abuse. History of covid 19 infection in the past Plan  Discussed with patient regarding left cardiac catheterization possible PTCA stenting its risk and benefits i.e. death MI stroke need for emergency CABG local vascular complications, risk of restenosis radial versus femoral approach its risk and benefits and consents for PCI    LOS: 0 days    Charolette Forward 12/23/2020, 11:35 AM

## 2020-12-23 NOTE — Progress Notes (Signed)
Inpatient Diabetes Program Recommendations  AACE/ADA: New Consensus Statement on Inpatient Glycemic Control (2015)  Target Ranges:  Prepandial:   less than 140 mg/dL      Peak postprandial:   less than 180 mg/dL (1-2 hours)      Critically ill patients:  140 - 180 mg/dL   Lab Results  Component Value Date   GLUCAP 174 (H) 12/23/2020   HGBA1C 10.0 (H) 12/22/2020    Review of Glycemic Control Results for KINSER, FELLMAN (MRN [id number]) as of 12/23/2020 14:42  Ref. Range 12/22/2020 17:18 12/22/2020 19:26 12/22/2020 21:12 12/23/2020 06:24 12/23/2020 06:52 12/23/2020 12:12 12/23/2020 12:41  Glucose-Capillary Latest Ref Range: 70 - 99 mg/dL 68 (L) 261 (H) 212 (H) 66 (L) 129 (H) 69 (L) 174 (H)   Diabetes history: DM 2 Outpatient Diabetes medications:  Lantus 30 units bid Metformin 1000 mg daily Current orders for Inpatient glycemic control:  Lantus 30 units q HS Novolog sensitive tid with meals Inpatient Diabetes Program Recommendations:   Please reduce Lantus 20 units q HS.   Thanks,  Adah Perl, RN, BC-ADM Inpatient Diabetes Coordinator Pager [phone] (8a-5p)

## 2020-12-24 LAB — CBC
HCT: 38.7 % — ABNORMAL LOW (ref 39.0–52.0)
Hemoglobin: 13.6 g/dL (ref 13.0–17.0)
MCH: 30.7 pg (ref 26.0–34.0)
MCHC: 35.1 g/dL (ref 30.0–36.0)
MCV: 87.4 fL (ref 80.0–100.0)
Platelets: 266 10*3/uL (ref 150–400)
RBC: 4.43 MIL/uL (ref 4.22–5.81)
RDW: 12.2 % (ref 11.5–15.5)
WBC: 4.7 10*3/uL (ref 4.0–10.5)
nRBC: 0 % (ref 0.0–0.2)

## 2020-12-24 LAB — BASIC METABOLIC PANEL
Anion gap: 12 (ref 5–15)
BUN: 16 mg/dL (ref 6–20)
CO2: 22 mmol/L (ref 22–32)
Calcium: 8.3 mg/dL — ABNORMAL LOW (ref 8.9–10.3)
Chloride: 101 mmol/L (ref 98–111)
Creatinine, Ser: 1.13 mg/dL (ref 0.61–1.24)
GFR, Estimated: >60 mL/min (ref >60)
Glucose, Bld: 252 mg/dL — ABNORMAL HIGH (ref 70–99)
Potassium: 4.3 mmol/L (ref 3.5–5.1)
Sodium: 135 mmol/L (ref 135–145)

## 2020-12-24 LAB — GLUCOSE, CAPILLARY
Glucose-Capillary: 202 mg/dL — ABNORMAL HIGH (ref 70–99)
Glucose-Capillary: 178 mg/dL — ABNORMAL HIGH (ref 70–99)
Glucose-Capillary: 128 mg/dL — ABNORMAL HIGH (ref 70–99)
Glucose-Capillary: 264 mg/dL — ABNORMAL HIGH (ref 70–99)

## 2020-12-24 MED ORDER — LOSARTAN POTASSIUM 25 MG PO TABS
25.0000 mg | ORAL_TABLET | Freq: Every day | ORAL | Status: DC
  Administered 2020-12-24 – 2020-12-25 (×2): 25 mg via ORAL
  Filled 2020-12-24 (×2): qty 1

## 2020-12-24 NOTE — Progress Notes (Signed)
CARDIAC REHAB PHASE I   PRE:  Rate/Rhythm: 68 SR  BP:  Supine:   Sitting: 144/117     SaO2: 97% RA  MODE:  Ambulation: 300 ft   POST:  Rate/Rhythm: 88 SR  BP:  Supine:   Sitting: 144/106    SaO2: 98 SR  Pt was complaining of shoulder pain that is chronic. Pt had also just been given tylenol and BP medication. Pt's BP was elevated but was asymptomatic. Pt ambulated 351ft on RA and tolerated well. He did c/o of chronic back pain and stiffness in lower body. Pt did not have any chest pain or dizziness. Returned pt to EOB. Gave pt stent card, discussed diabetic and heart healthy diet, NTG use, ASA and Plavix, MI booklet, exercise guidelines, and restrictions. Pt was receptive and verbalized understanding. Will send CRP II referral to [facility].  [phone]  Rick Duff, MS, ACSM-CEP

## 2020-12-24 NOTE — Progress Notes (Signed)
Ref: Vevelyn Francois, NP   Subjective:  Awake. No chest pain.  Elevated sugar levels with decreased lantus dose. Hgb A1C is 10 % VS stable. S/P LAD and diagonal stents. Possible RCA, PL stents in near future. Patient c/o lower leg pain with ambulation prior to hospital admission.  Objective:  Vital Signs in the last 24 hours: Temp:  [97.4 F (36.3 C)-98.1 F (36.7 C)] 98 F (36.7 C) (02/19 0709) Pulse Rate:  [53-85] 73 (02/19 0709) Cardiac Rhythm: Normal sinus rhythm (02/19 0719) Resp:  [8-18] 18 (02/19 0709) BP: (106-143)/(75-109) 134/83 (02/19 0709) SpO2:  [96 %-100 %] 97 % (02/19 0709)  Physical Exam: BP Readings from Last 1 Encounters:  12/24/20 134/83     Wt Readings from Last 1 Encounters:  12/22/20 76.8 kg    Weight change:  Body mass index is 25 kg/m. HEENT: [place]/AT, Eyes-Brown, Conjunctiva-Pink, Sclera-Non-icteric Neck: No JVD, No bruit, Trachea midline. Lungs:  Clear, Bilateral. Cardiac:  Regular rhythm, normal S1 and S2, no S3. II/VI systolic murmur. Abdomen:  Soft, non-tender. BS present. Extremities:  No edema present. No cyanosis. No clubbing. Right groin cath site is stable. CNS: AxOx3, Cranial nerves grossly intact, moves all 4 extremities.  Skin: Warm and dry.   Intake/Output from previous day: 02/18 0701 - 02/19 0700 In: 2079.7 [I.V.:2079.7] Out: 2375 [Urine:2375]    Lab Results: BMET    Component Value Date/Time   NA 135 12/24/2020 0039   NA 138 12/23/2020 0019   NA 140 12/22/2020 0730   NA 136 12/19/2020 1121   NA 138 07/22/2020 1231   NA 137 11/06/2018 1546   K 4.3 12/24/2020 0039   K 4.2 12/23/2020 0019   K 3.9 12/22/2020 0730   CL 101 12/24/2020 0039   CL 103 12/23/2020 0019   CL 103 12/22/2020 0730   CO2 22 12/24/2020 0039   CO2 26 12/23/2020 0019   CO2 27 12/22/2020 0730   GLUCOSE 252 (H) 12/24/2020 0039   GLUCOSE 133 (H) 12/23/2020 0019   GLUCOSE 168 (H) 12/22/2020 0730   BUN 16 12/24/2020 0039   BUN 18 12/23/2020 0019    BUN 17 12/22/2020 0730   BUN 21 12/19/2020 1121   BUN 21 07/22/2020 1231   BUN 17 11/06/2018 1546   CREATININE 1.13 12/24/2020 0039   CREATININE 1.05 12/23/2020 0019   CREATININE 1.22 12/22/2020 0730   CREATININE 1.14 09/19/2017 1036   CALCIUM 8.3 (L) 12/24/2020 0039   CALCIUM 9.1 12/23/2020 0019   CALCIUM 9.6 12/22/2020 0730   GFRNONAA >60 12/24/2020 0039   GFRNONAA >60 12/23/2020 0019   GFRNONAA >60 12/22/2020 0730   GFRNONAA 71 09/19/2017 1036   GFRAA 76 12/19/2020 1121   GFRAA 80 07/22/2020 1231   GFRAA 82 11/06/2018 1546   GFRAA 82 09/19/2017 1036   CBC    Component Value Date/Time   WBC 4.7 12/24/2020 0039   RBC 4.43 12/24/2020 0039   HGB 13.6 12/24/2020 0039   HGB 14.9 05/06/2020 1520   HCT 38.7 (L) 12/24/2020 0039   HCT 42.1 05/06/2020 1520   PLT 266 12/24/2020 0039   PLT 283 05/06/2020 1520   MCV 87.4 12/24/2020 0039   MCV 86 05/06/2020 1520   MCH 30.7 12/24/2020 0039   MCHC 35.1 12/24/2020 0039   RDW 12.2 12/24/2020 0039   RDW 12.0 05/06/2020 1520   LYMPHSABS 1.7 09/26/2020 2128   LYMPHSABS 1.4 05/06/2020 1520   MONOABS 0.3 09/26/2020 2128   EOSABS 0.1 09/26/2020 2128  EOSABS 0.1 05/06/2020 1520   BASOSABS 0.1 09/26/2020 2128   BASOSABS 0.0 05/06/2020 1520   HEPATIC Function Panel Recent Labs    07/22/20 1231 09/26/20 2128 12/19/20 1121  PROT 6.7 6.0* 6.8   HEMOGLOBIN A1C No components found for: HGA1C,  MPG CARDIAC ENZYMES No results found for: CKTOTAL, CKMB, CKMBINDEX, TROPONINI BNP No results for input(s): PROBNP in the last 8760 hours. TSH Recent Labs    05/06/20 1520  TSH 0.605   CHOLESTEROL Recent Labs    05/06/20 1520 12/19/20 1121 12/23/20 0019  CHOL 220* 231* 217*    Scheduled Meds: . aspirin EC  81 mg Oral Daily  . atorvastatin  80 mg Oral Daily  . clopidogrel  75 mg Oral Q breakfast  . gabapentin  300 mg Oral TID  . insulin aspart  0-9 Units Subcutaneous TID WC  . insulin glargine  30 Units Subcutaneous QHS  .  metoprolol tartrate  12.5 mg Oral BID  . sodium chloride flush  3 mL Intravenous Q12H  . sodium chloride flush  3 mL Intravenous Q12H   Continuous Infusions: . sodium chloride Stopped (12/23/20 0720)  . sodium chloride    . nitroGLYCERIN Stopped (12/23/20 1154)   PRN Meds:.sodium chloride, acetaminophen, nitroGLYCERIN, ondansetron (ZOFRAN) IV, sodium chloride flush  Assessment/Plan: Acute coronary syndrome CAD S/P recent LAD and diagonal stents S/P old LAD stent Type 2 DM with hyperglycemia Hyperlipidemia H/O COVID 19 infection PAD  Plan: Increase activity. Monitor sugar levels. Lower extremities vascular study/ABI in near future. Possible RCA/PL stents in near future.   LOS: 1 day   Time spent including chart review, lab review, examination, discussion with patient/Nurse :  30 min   Dixie Dials  MD  12/24/2020, 10:59 AM

## 2020-12-25 LAB — CBC
HCT: 41.6 % (ref 39.0–52.0)
Hemoglobin: 14.5 g/dL (ref 13.0–17.0)
MCH: 30.2 pg (ref 26.0–34.0)
MCHC: 34.9 g/dL (ref 30.0–36.0)
MCV: 86.7 fL (ref 80.0–100.0)
Platelets: 250 10*3/uL (ref 150–400)
RBC: 4.8 MIL/uL (ref 4.22–5.81)
RDW: 12.1 % (ref 11.5–15.5)
WBC: 5 10*3/uL (ref 4.0–10.5)
nRBC: 0 % (ref 0.0–0.2)

## 2020-12-25 LAB — BASIC METABOLIC PANEL
Anion gap: 11 (ref 5–15)
BUN: 14 mg/dL (ref 6–20)
CO2: 25 mmol/L (ref 22–32)
Calcium: 9.3 mg/dL (ref 8.9–10.3)
Chloride: 102 mmol/L (ref 98–111)
Creatinine, Ser: 1.11 mg/dL (ref 0.61–1.24)
GFR, Estimated: >60 mL/min (ref >60)
Glucose, Bld: 181 mg/dL — ABNORMAL HIGH (ref 70–99)
Potassium: 4.4 mmol/L (ref 3.5–5.1)
Sodium: 138 mmol/L (ref 135–145)

## 2020-12-25 LAB — GLUCOSE, CAPILLARY
Glucose-Capillary: 108 mg/dL — ABNORMAL HIGH (ref 70–99)
Glucose-Capillary: 234 mg/dL — ABNORMAL HIGH (ref 70–99)

## 2020-12-25 MED ORDER — NITROGLYCERIN 0.4 MG SL SUBL: 0.4000 mg | SUBLINGUAL_TABLET | SUBLINGUAL | 1 refills | Status: AC | PRN

## 2020-12-25 MED ORDER — CLOPIDOGREL BISULFATE 75 MG PO TABS: 75.0000 mg | ORAL_TABLET | Freq: Every day | ORAL | 3 refills | Status: AC

## 2020-12-25 MED ORDER — ATORVASTATIN CALCIUM 80 MG PO TABS: 80.0000 mg | ORAL_TABLET | Freq: Every day | ORAL | 3 refills | Status: AC

## 2020-12-25 MED ORDER — INSULIN GLARGINE 100 UNIT/ML ~~LOC~~ SOLN: 30.0000 [IU] | Freq: Every day | SUBCUTANEOUS | Status: DC

## 2020-12-25 MED ORDER — METFORMIN HCL 1000 MG PO TABS: 1000.0000 mg | ORAL_TABLET | Freq: Two times a day (BID) | ORAL | 3 refills | Status: AC

## 2020-12-25 NOTE — Discharge Summary (Signed)
Physician Discharge Summary  Patient ID: Dean Robertson MRN: [id number] DOB/AGE: June 29, 1961 61 y.o.  Admit date: 12/22/2020 Discharge date: 12/25/2020  Admission Diagnoses: Acute coronary syndrome Coronary artery disease, status post PCI in the remote past Hypertension Type 2 diabetes mellitus Hyperlipidemia Remote tobacco abuse History of COVID-19 infection in the past  Discharge Diagnoses:  Principle Problem: Non-ST elevation myocardial infarction Active Problems: Multivessel, native vessel, coronary artery disease Stenting of mid LAD and proximal portion of first diagonal artery Hypertension Type 2 diabetes mellitus with hyperglycemia Hyperlipidemia Remote tobacco abuse History of COVID-19 infection  Discharged Condition: good  Hospital Course: 60 year old black male with past medical history for coronary artery disease s/p proximal LAD stent placement in 2011, hypertension, insulin requiring type 2 diabetes mellitus, hyperlipidemia, remote tobacco use disorder, presented to the emergency room with recurrent retrosternal chest pain described as a burning pain for the last 2 days radiating to his throat.  Patient's EKG showed minor ST-T wave changes in inferior and anterior leads and had minimally elevated troponin I levels. He underwent cardiac catheterization showing significant multivessel coronary artery disease.  He underwent drug-eluting stent placement in the mid LAD and proximal diagonal artery with good result and TIMI grade III flow.  He may undergo stenting of his posterior descending coronary artery and posterolateral branch in near future.   Postprocedure patient ambulated well, his right groin cath site was unremarkable, he had diabetic teaching for his hyperglycemia and he will take aspirin Plavix for preferably 1 to 2 years. He agrees to reduce bread intake and follow heart healthy diet. He was discharged home in stable condition with follow-up by Dr. Terrence Dupont in 1 week  and by primary care physician in 2 weeks  Consults: cardiology  Significant Diagnostic Studies: labs: CBC was unremarkable, BMP that was unremarkable except for hyperglycemia.  Total cholesterol was high at 217 mg triglycerides was 270 mg HDL 57 mg VLDL 54 mg and LDL cholesterol was 106 mg. Hemoglobin A1c was 10.0% with average of glucose of 240 mg. Troponin I level was 112 daily and 111 ng.  EKG showed sinus rhythm with inferolateral ischemia.  It remains unchanged post cardiac cath + angioplasty procedure.  Chest x-ray was essentially unremarkable.  Cardiac catheterization showed multivessel coronary artery disease.  Mid LAD and proximal diagonal vessels were stented with a drug-eluting stent.  Posterior descending coronary artery and posterolateral branch stenting was deferred for now.  Treatments: cardiac meds: Aspirin and Plavix, lisinopril (generic), propranolol and Atorvastatin. Metformin and Insulin for type 2 DM.  Discharge Exam: Blood pressure 131/87, pulse 68, temperature 98.2 F (36.8 C), temperature source Oral, resp. rate 15, height 5' 9" (1.753 m), weight 76.8 kg, SpO2 95 %. General appearance: alert, cooperative and appears stated age. Head: Normocephalic, atraumatic. Eyes: Brown eyes, pink conjunctiva, corneas clear.   Neck: No adenopathy, no carotid bruit, no JVD, supple, symmetrical, trachea midline and thyroid not enlarged. Resp: Clear to auscultation bilaterally. Cardio: Regular rate and rhythm, S1, S2 normal, II/VI systolic murmur, no click, rub or gallop. GI: Soft, non-tender; bowel sounds normal; no organomegaly. Extremities: No edema, cyanosis or clubbing. No hematoma, bruize or discharge from right groin cath site. Skin: Warm and dry.  Neurologic: Alert and oriented X 3, normal strength and tone. Normal coordination and gait.  Disposition: Discharge disposition: 01-Home or Self Care       Discharge Instructions    Amb Referral to Cardiac Rehabilitation    Complete by: As directed    Letter sent to  [facility] for pt to participate in program   Diagnosis:  NSTEMI Coronary Stents     After initial evaluation and assessments completed: Virtual Based Care may be provided alone or in conjunction with Phase 2 Cardiac Rehab based on patient barriers.: Yes     Allergies as of 12/25/2020      Reactions   Tramadol Nausea And Vomiting      Medication List    STOP taking these medications   Lantus SoloStar 100 UNIT/ML Solostar Pen Generic drug: insulin glargine Replaced by: insulin glargine 100 UNIT/ML injection   meloxicam 15 MG tablet Commonly known as: MOBIC   methocarbamol 500 MG tablet Commonly known as: ROBAXIN   naproxen 500 MG tablet Commonly known as: NAPROSYN   sildenafil 25 MG tablet Commonly known as: Viagra   simvastatin 20 MG tablet Commonly known as: ZOCOR   tiZANidine 4 MG tablet Commonly known as: Zanaflex     TAKE these medications   acetaminophen 500 MG tablet Commonly known as: TYLENOL Take 1 tablet (500 mg total) by mouth every 6 (six) hours as needed. What changed: reasons to take this   aspirin EC 81 MG tablet Take 81 mg by mouth daily.   atorvastatin 80 MG tablet Commonly known as: LIPITOR Take 1 tablet (80 mg total) by mouth daily. Start taking on: December 26, 2020   Biofreeze 4 % Gel Generic drug: Menthol (Topical Analgesic) Apply 1 application topically 4 (four) times daily as needed. What changed: reasons to take this   busPIRone 10 MG tablet Commonly known as: BUSPAR Take 1 tablet (10 mg total) by mouth 2 (two) times daily.   clopidogrel 75 MG tablet Commonly known as: PLAVIX Take 1 tablet (75 mg total) by mouth daily with breakfast. Start taking on: December 26, 2020   cyclobenzaprine 5 MG tablet Commonly known as: FLEXERIL Take 1 tablet (5 mg total) by mouth 3 (three) times daily as needed for muscle spasms.   diclofenac Sodium 1 % Gel Commonly known as:  Voltaren Apply 2 g topically 4 (four) times daily. What changed:   when to take this  reasons to take this   gabapentin 300 MG capsule Commonly known as: NEURONTIN TAKE 1 CAPSULE(300 MG) BY MOUTH THREE TIMES DAILY What changed:   how much to take  how to take this  when to take this  reasons to take this  additional instructions   insulin glargine 100 UNIT/ML injection Commonly known as: LANTUS Inject 0.3 mLs (30 Units total) into the skin at bedtime. Replaces: Lantus SoloStar 100 UNIT/ML Solostar Pen   Lancets Misc 1 each 3 (three) times daily by Does not apply route.   lisinopril 20 MG tablet Commonly known as: ZESTRIL TAKE 1 TABLET(20 MG) BY MOUTH DAILY What changed:   how much to take  how to take this  when to take this   metFORMIN 1000 MG tablet Commonly known as: GLUCOPHAGE Take 1 tablet (1,000 mg total) by mouth 2 (two) times daily with a meal. Starting Monday. What changed: additional instructions   nitroGLYCERIN 0.4 MG SL tablet Commonly known as: NITROSTAT Place 1 tablet (0.4 mg total) under the tongue every 5 (five) minutes x 3 doses as needed for chest pain.   propranolol 40 MG tablet Commonly known as: INDERAL Take 1 tablet (40 mg total) by mouth 2 (two) times daily. As needed for anxiety What changed:   when to take this  reasons to take this   True  Metrix Air Glucose Meter w/Device Kit 1 each by Does not apply route 3 (three) times daily.   True Metrix Blood Glucose Test test strip Generic drug: glucose blood Use as instructed   Vitrum Senior Tabs Take 1 tablet by mouth daily.       Follow-up Information    Vevelyn Francois, NP. Schedule an appointment as soon as possible for a visit in 2 week(s).   Specialty: Adult Health Nurse Practitioner Contact information: [address] [place] [address] [phone]        Charolette Forward, MD. Schedule an appointment as soon as possible for a visit in 1 week(s).    Specialty: Cardiology Contact information: [address] [place] [address] [phone]               Time spent: Review of old chart, current chart, lab, x-ray, cardiac tests and discussion with patient/Nurse/Doctor over 60 minutes.  Signed: Birdie Riddle 12/25/2020, 9:52 AM

## 2020-12-25 NOTE — Plan of Care (Signed)
  Problem: Education: Goal: Knowledge of General Education information will improve Description: Including pain rating scale, medication(s)/side effects and non-pharmacologic comfort measures Outcome: Completed/Met   Problem: Clinical Measurements: Goal: Ability to maintain clinical measurements within normal limits will improve Outcome: Completed/Met Goal: Will remain free from infection Outcome: Completed/Met Goal: Diagnostic test results will improve Outcome: Completed/Met Goal: Respiratory complications will improve Outcome: Completed/Met Goal: Cardiovascular complication will be avoided Outcome: Completed/Met   Problem: Activity: Goal: Risk for activity intolerance will decrease Outcome: Completed/Met   Problem: Nutrition: Goal: Adequate nutrition will be maintained Outcome: Completed/Met   Problem: Coping: Goal: Level of anxiety will decrease Outcome: Completed/Met   Problem: Elimination: Goal: Will not experience complications related to bowel motility Outcome: Completed/Met Goal: Will not experience complications related to urinary retention Outcome: Completed/Met   Problem: Pain Managment: Goal: General experience of comfort will improve Outcome: Completed/Met   Problem: Safety: Goal: Ability to remain free from injury will improve Outcome: Completed/Met   Problem: Skin Integrity: Goal: Risk for impaired skin integrity will decrease Outcome: Completed/Met   Problem: Education: Goal: Understanding of CV disease, CV risk reduction, and recovery process will improve Outcome: Completed/Met Goal: Individualized Educational Video(s) Outcome: Completed/Met   Problem: Activity: Goal: Ability to return to baseline activity level will improve Outcome: Completed/Met   Problem: Cardiovascular: Goal: Ability to achieve and maintain adequate cardiovascular perfusion will improve Outcome: Completed/Met Goal: Vascular access site(s) Level 0-1 will be  maintained Outcome: Completed/Met   Problem: Health Behavior/Discharge Planning: Goal: Ability to safely manage health-related needs after discharge will improve Outcome: Completed/Met

## 2020-12-25 NOTE — Progress Notes (Signed)
Pt got discharged to home, discharge instructions provided and patient showed understanding to it, IV taken out,Telemonitor DC,pt left unit in wheelchair with all of the belongings accompanied with a family member (Friend)  Ramesses Crampton, RN 

## 2020-12-26 LAB — POCT ACTIVATED CLOTTING TIME
Activated Clotting Time: 154 seconds
Activated Clotting Time: 470 seconds

## 2020-12-29 ENCOUNTER — Telehealth (HOSPITAL_COMMUNITY): Payer: Self-pay

## 2020-12-29 NOTE — Telephone Encounter (Signed)
Cardiac rehab referral for Ph.II faxed to [place].

## 2021-01-02 ENCOUNTER — Ambulatory Visit: Payer: Commercial Managed Care - PPO | Admitting: Nurse Practitioner

## 2021-01-02 ENCOUNTER — Other Ambulatory Visit: Payer: Self-pay

## 2021-01-02 ENCOUNTER — Telehealth: Payer: Self-pay

## 2021-01-02 ENCOUNTER — Ambulatory Visit (HOSPITAL_COMMUNITY)
Admission: RE | Admit: 2021-01-02 | Discharge: 2021-01-02 | Disposition: A | Discharge status: Home / Self Care | Payer: Commercial Managed Care - PPO | Source: Ambulatory Visit | Attending: Nurse Practitioner | Admitting: Nurse Practitioner

## 2021-01-02 DIAGNOSIS — M5136 Other intervertebral disc degeneration, lumbar region: Secondary | ICD-10-CM | POA: Insufficient documentation

## 2021-01-02 IMAGING — MR MR LUMBAR SPINE WO/W CM
7 of 8 series · 26 of 48 positions shown · IV contrast (with contrast)
Comparison: Radiographs from [DATE]

CLINICAL DATA: Continued low back pain for greater than 6 weeks.
Osteoarthritis.

EXAM:
MRI LUMBAR SPINE WITHOUT AND WITH CONTRAST
TECHNIQUE: Multiplanar and multiecho pulse sequences of the lumbar spine were
obtained without and with intravenous contrast.
CONTRAST:  7mL GADAVIST GADOBUTROL 1 MMOL/ML IV SOLN

[Series 5: T1 · sagittal · 4.0mm · 0.81mm/px · 3 of 16 slices shown (1 of 2)]
[im 1/16]
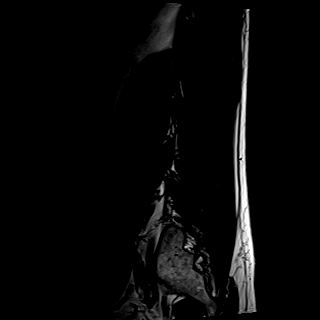
[im 8/16]
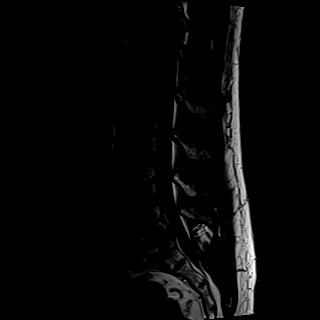
[im 16/16]
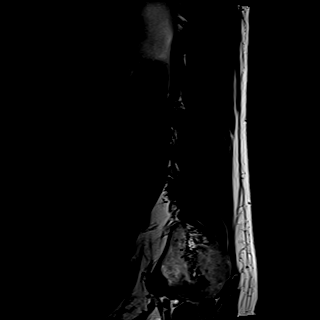

[Series 6: STIR · sagittal · 4.0mm · 0.51mm/px · 3 of 16 slices shown]
[im 1/16]
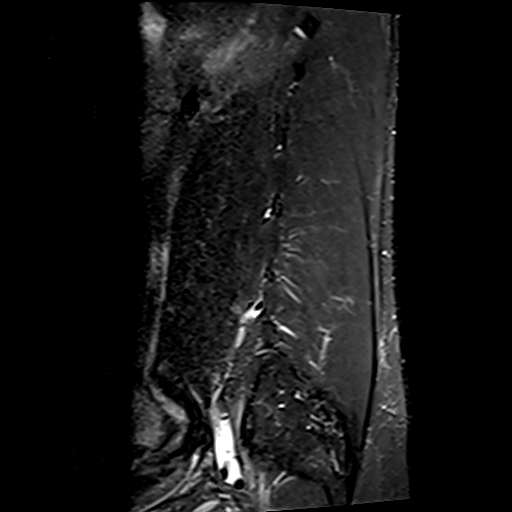
[im 8/16]
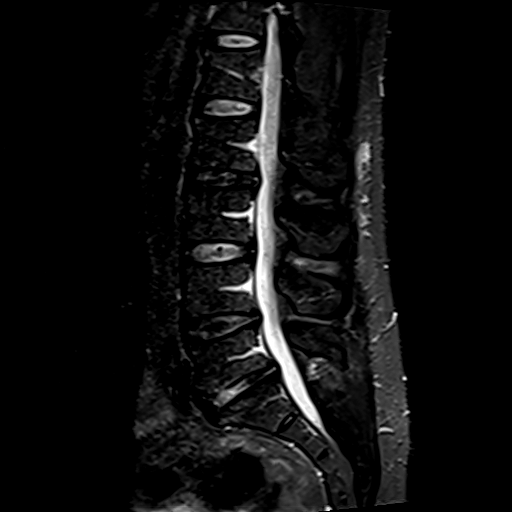
[im 16/16]
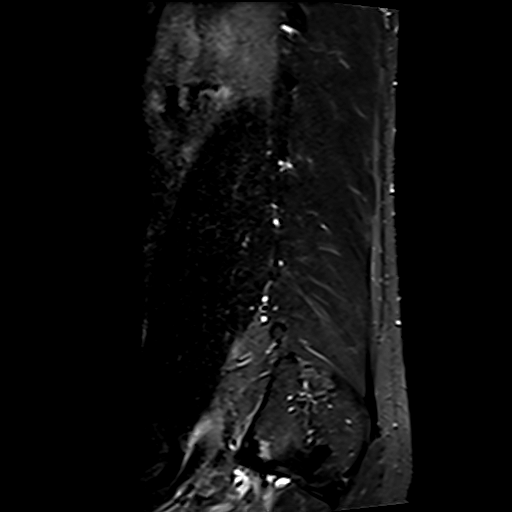

[Series 7: T2 · axial · 4.0mm · 0.62mm/px · z∈[-148,+93]mm · 6 of 43 slices shown]
[im 1/43]
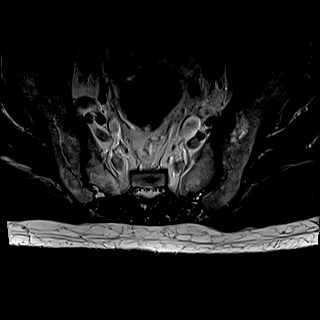
[im 9/43]
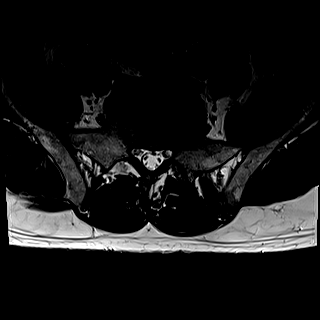
[im 17/43]
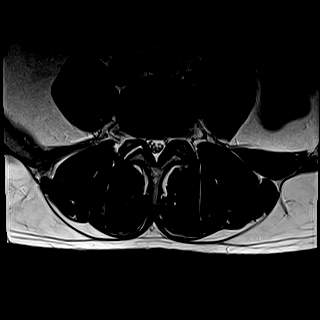
[im 26/43]
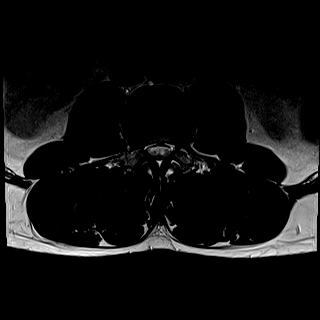
[im 34/43]
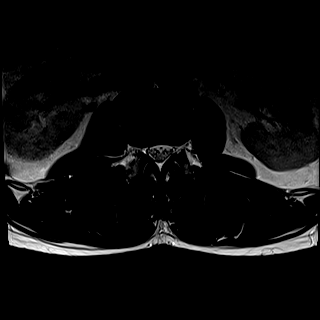
[im 43/43]
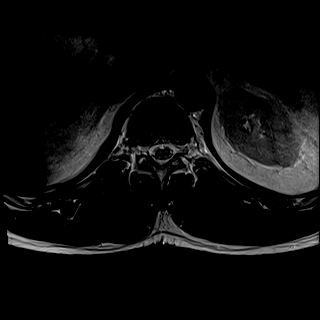

[Series 8: T1 · axial · 4.0mm · 0.39mm/px · z∈[-148,+93]mm · 6 of 43 slices shown (2 of 2)]
[im 1/43]
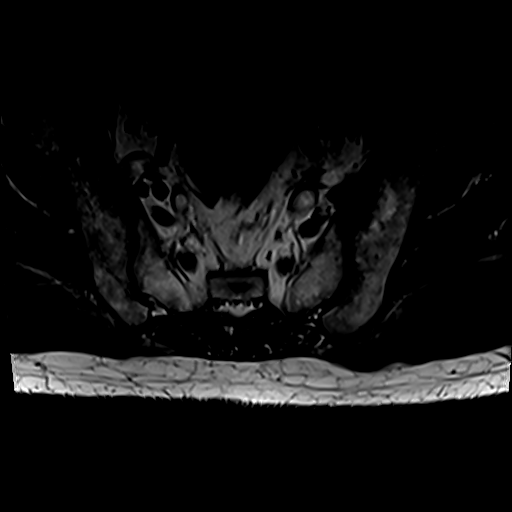
[im 9/43]
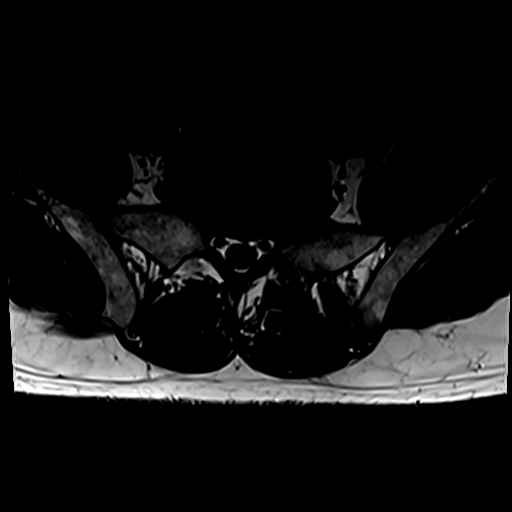
[im 17/43]
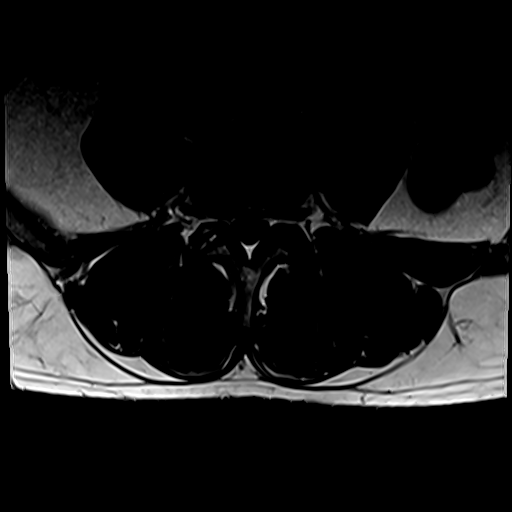
[im 26/43]
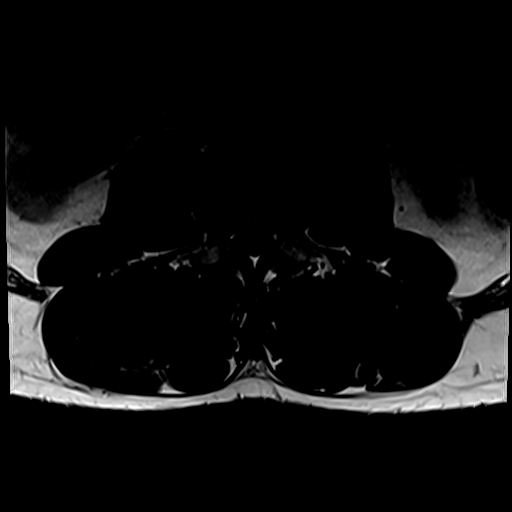
[im 34/43]
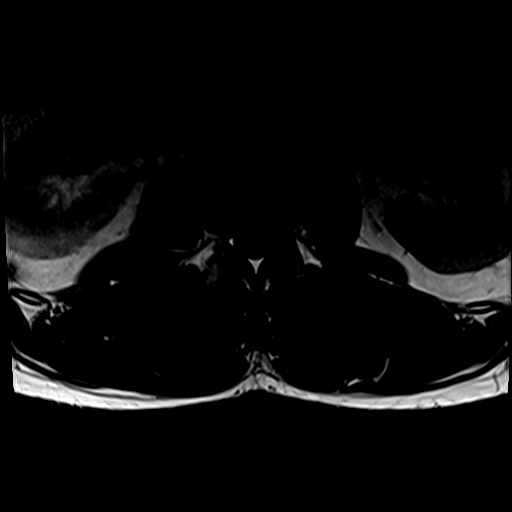
[im 43/43]
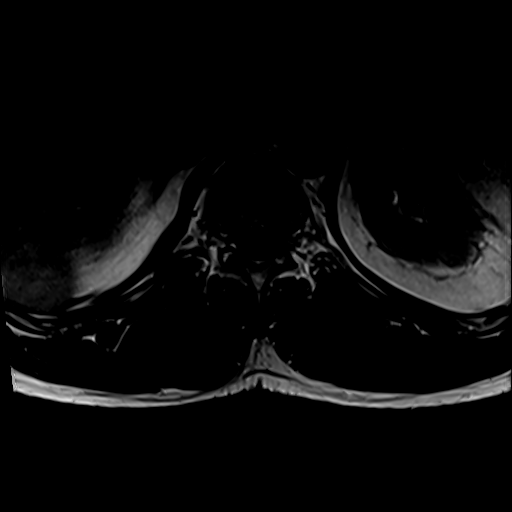

[Series 9: T2 post-contrast · sagittal · 4.0mm · 0.81mm/px · 2 of 16 slices shown]
[im 1/16]
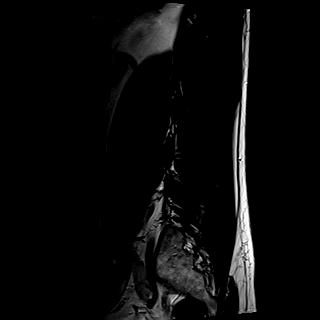
[im 16/16]
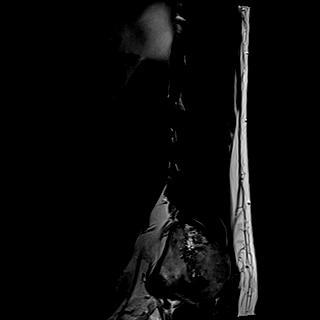

[Series 10: T1 fat-sat post-contrast · sagittal · 4.0mm · 0.81mm/px · 2 of 16 slices shown]
[im 1/16]
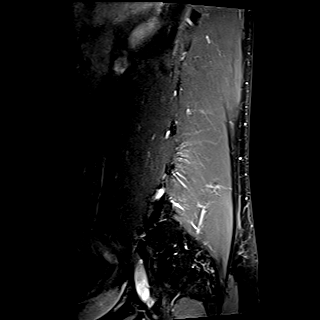
[im 16/16]
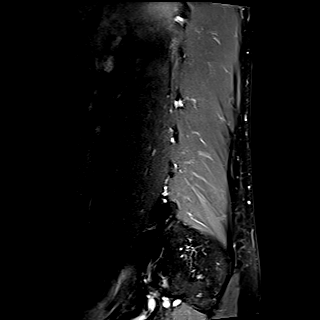

[Series 11: T1 post-contrast · axial · 4.0mm · 0.43mm/px · z∈[-149,+8]mm · 4 of 43 slices shown]
[im 1/43]
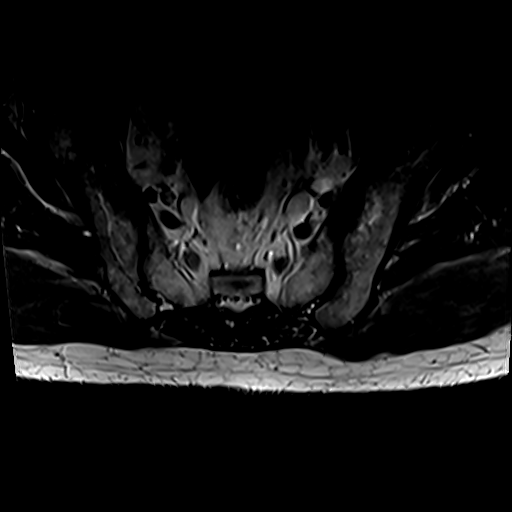
[im 9/43]
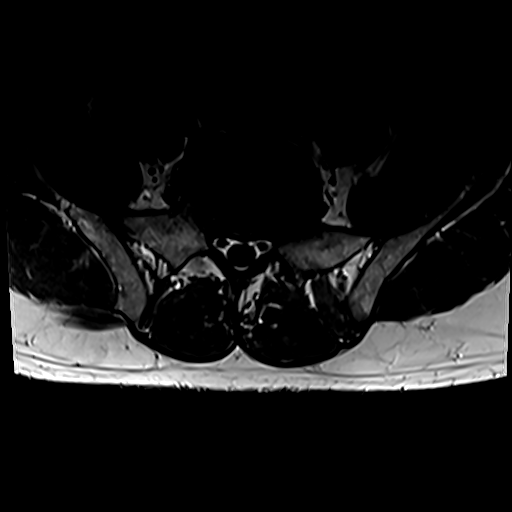
[im 17/43]
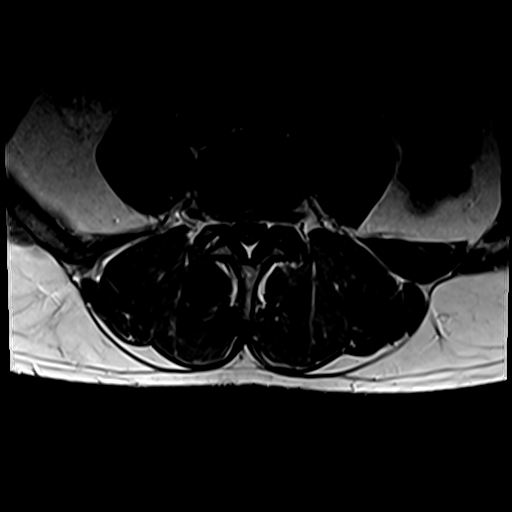
[im 26/43]
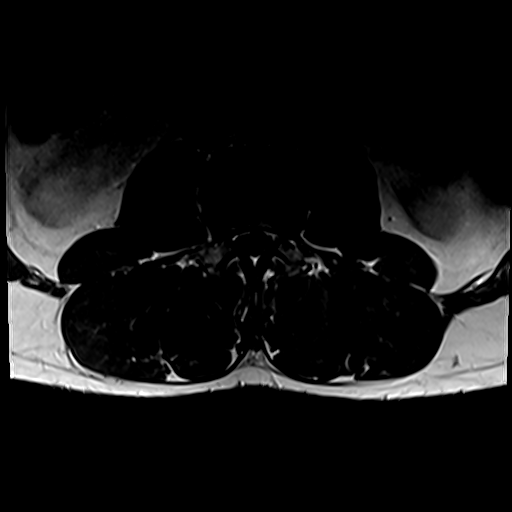

[26 of 48 positions shown; findings below may reference images not displayed]

FINDINGS: Segmentation: The lowest lumbar type non-rib-bearing vertebra is
labeled as L5.

Alignment:  No vertebral subluxation is observed.

Vertebrae: Congenitally short pedicles. Disc desiccation at L2-3,
L4-5, and L5-S1 with mild loss of disc height at L2-3 and L5-S1.
Type 2 degenerative endplate findings at L5-S1. No significant
vertebral marrow edema is identified.

Conus medullaris and cauda equina: Conus extends to the L1 level.
Conus and cauda equina appear normal.

Paraspinal and other soft tissues: Mild sclerosis along the SI
joints may reflect low-grade bilateral sacroiliitis.

Disc levels:

L1-2: Unremarkable.

L2-3: Moderate central narrowing of the thecal sac and mild to
moderate left subarticular lateral recess stenosis due to short
pedicles, disc bulge, and left lateral recess disc protrusion. AP
diameter of the thecal sac is narrowed to 0.8 cm.

L3-4: Mild central narrowing of the thecal sac due to short
pedicles, disc bulge, and left paracentral disc protrusion. The AP
diameter of the thecal sac is narrowed to 0.9 cm.

L4-5: Moderate central narrowing of the thecal sac with mild
bilateral subarticular lateral recess stenosis and mild left
foraminal stenosis due to diffuse disc bulge, left facet
arthropathy, and congenitally short pedicles. The AP diameter of the
thecal sac is narrowed to 0.7 cm.

L5-S1: Mild bilateral subarticular lateral recess stenosis and mild
right foraminal stenosis due to diffuse disc bulge, intervertebral
spurring, and facet spurring.
IMPRESSION: 1. Lumbar spondylosis, degenerative disc disease, and congenitally
short pedicles causing moderate impingement at L2-3 and L4-5, and
mild impingement at L3-4 and L5-S1.
2. Mild sclerosis along the SI joints may reflect low-grade
bilateral sacroiliitis.

## 2021-01-02 MED ORDER — GADOBUTROL 1 MMOL/ML IV SOLN
7.0000 mL | Freq: Once | INTRAVENOUS | Status: AC | PRN
  Administered 2021-01-02: 7 mL via INTRAVENOUS

## 2021-01-02 NOTE — Telephone Encounter (Signed)
Called pt to r/s appt missed today!

## 2021-01-06 ENCOUNTER — Encounter: Payer: Self-pay | Admitting: Physical Medicine and Rehabilitation

## 2021-01-06 ENCOUNTER — Ambulatory Visit (INDEPENDENT_AMBULATORY_CARE_PROVIDER_SITE_OTHER): Payer: Commercial Managed Care - PPO | Admitting: Physical Medicine and Rehabilitation

## 2021-01-06 ENCOUNTER — Other Ambulatory Visit: Payer: Self-pay

## 2021-01-06 ENCOUNTER — Encounter: Payer: Commercial Managed Care - PPO | Admitting: Physical Medicine and Rehabilitation

## 2021-01-06 DIAGNOSIS — R202 Paresthesia of skin: Secondary | ICD-10-CM

## 2021-01-06 DIAGNOSIS — M542 Cervicalgia: Secondary | ICD-10-CM | POA: Diagnosis not present

## 2021-01-06 DIAGNOSIS — M25511 Pain in right shoulder: Secondary | ICD-10-CM

## 2021-01-06 DIAGNOSIS — M545 Low back pain, unspecified: Secondary | ICD-10-CM | POA: Diagnosis not present

## 2021-01-06 NOTE — Progress Notes (Signed)
Dean Robertson - 60 y.o. male MRN [id number]  Date of birth: [birth year]  Office Visit Note: Visit Date: 01/06/2021 PCP: Vevelyn Francois, NP Referred by: Vevelyn Francois, NP  Subjective: Chief Complaint  Patient presents with  . Neck - Pain  . Right Shoulder - Pain  . Lower Back - Pain   HPI:  Dean Robertson is a 60 y.o. male who comes in today at the request of Dr. Basil Dess for electrodiagnostic study of the Right upper extremities.  Patient is Right hand dominant.  His main complaint is chronic severe 10 out of 10 right neck and shoulder pain.  He does have pain with the right shoulder when he moves the shoulder particularly with abduction and forward flexion.  He does get some nondermatomal tingling in the right hand.  He is an insulin-dependent diabetic.  His hemoglobin A1c has been increasing over the last several months with the last value in December being over 11.  Uncontrolled diabetes at this point.  Cervical MRI does not reveal any high-grade central stenosis or foraminal stenosis but he does have moderate stenosis at C5-6 and C6-7.   ROS Otherwise per HPI.  Assessment & Plan: Visit Diagnoses:    ICD-10-CM   1. 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   There is no significant electrodiagnostic evidence of any other focal nerve entrapment, brachial plexopathy or cervical radiculopathy.   Recommendations: 1.  Follow-up with referring physician. 2.  Continue current management of symptoms. 3.  Continue use of resting splint at night-time and as needed during the day. 4.  Suggest surgical evaluation.  Meds & Orders: No orders of the defined types were placed in this encounter.   Orders Placed This Encounter  Procedures  . NCV with EMG  (electromyography)    Follow-up: Return in about 2 weeks (around 01/20/2021) for Larae Grooms.   Procedures: No procedures performed  EMG & NCV Findings: Evaluation of the right median motor nerve showed prolonged distal onset latency (4.8 ms), reduced amplitude (4.7 mV), and decreased conduction velocity (Elbow-Wrist, 40 m/s).  The right ulnar motor nerve showed decreased conduction velocity (B Elbow-Wrist, 49 m/s).  The right median (across palm) sensory nerve showed no response (Palm), prolonged distal peak latency (7.3 ms), and reduced amplitude (9.0 V).  All remaining nerves (as indicated in the following tables) were within normal limits.    All examined muscles (as indicated in the following table) showed no evidence of electrical instability.    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   There is no significant electrodiagnostic evidence of any other focal nerve entrapment, brachial plexopathy or cervical radiculopathy.   Recommendations: 1.  Follow-up with referring physician. 2.  Continue current management of symptoms. 3.  Continue use of resting splint at night-time and as needed during the day. 4.  Suggest surgical evaluation.  ___________________________ Laurence Spates FAAPMR Board Certified, American Board of Physical Medicine and Rehabilitation    Nerve Conduction Studies Anti Sensory Summary Table   Stim Site NR Peak (ms) Norm Peak (ms) P-T Amp (V) Norm P-T Amp Site1 Site2 Delta-P (ms) Dist (cm) Vel (m/s) Norm Vel (  m/s)  Right Median Acr Palm Anti Sensory (2nd Digit)  31C  Wrist    *7.3 <3.6 *9.0 >10 Wrist Palm  0.0    Palm *NR  <2.0          Right Radial Anti Sensory (Base 1st Digit)  30.8C  Wrist    2.3 <3.1 21.6  Wrist Base 1st Digit 2.3 0.0    Right Ulnar Anti Sensory (5th Digit)  31.5C   Wrist    3.7 <3.7 16.0 >15.0 Wrist 5th Digit 3.7 14.0 38 >38   Motor Summary Table   Stim Site NR Onset (ms) Norm Onset (ms) O-P Amp (mV) Norm O-P Amp Site1 Site2 Delta-0 (ms) Dist (cm) Vel (m/s) Norm Vel (m/s)  Right Median Motor (Abd Poll Brev)  31.1C    Martin-Gruber  Wrist    *4.8 <4.2 *4.7 >5 Elbow Wrist 5.7 23.0 *40 >50  Elbow    10.5  2.0         Right Ulnar Motor (Abd Dig Min)  31.2C  Wrist    3.0 <4.2 10.6 >3 B Elbow Wrist 4.7 23.0 *49 >53  B Elbow    7.7  7.8  A Elbow B Elbow 1.8 10.0 56 >53  A Elbow    9.5  7.0          EMG   Side Muscle Nerve Root Ins Act Fibs Psw Amp Dur Poly Recrt Int Fraser Din Comment  Right 1stDorInt Ulnar C8-T1 Nml Nml Nml Nml Nml 0 Nml Nml   Right Abd Poll Brev Median C8-T1 Nml Nml Nml Nml Nml 0 Nml Nml   Right ExtDigCom   Nml Nml Nml Nml Nml 0 Nml Nml   Right Triceps Radial C6-7-8 Nml Nml Nml Nml Nml 0 Nml Nml   Right Deltoid Axillary C5-6 Nml Nml Nml Nml Nml 0 Nml Nml     Nerve Conduction Studies Anti Sensory Left/Right Comparison   Stim Site L Lat (ms) R Lat (ms) L-R Lat (ms) L Amp (V) R Amp (V) L-R Amp (%) Site1 Site2 L Vel (m/s) R Vel (m/s) L-R Vel (m/s)  Median Acr Palm Anti Sensory (2nd Digit)  31C  Wrist  *7.3   *9.0  Wrist Palm     Palm             Radial Anti Sensory (Base 1st Digit)  30.8C  Wrist  2.3   21.6  Wrist Base 1st Digit     Ulnar Anti Sensory (5th Digit)  31.5C  Wrist  3.7   16.0  Wrist 5th Digit  38    Motor Left/Right Comparison   Stim Site L Lat (ms) R Lat (ms) L-R Lat (ms) L Amp (mV) R Amp (mV) L-R Amp (%) Site1 Site2 L Vel (m/s) R Vel (m/s) L-R Vel (m/s)  Median Motor (Abd Poll Brev)  31.1C    Martin-Gruber  Wrist  *4.8   *4.7  Elbow Wrist  *40   Elbow  10.5   2.0        Ulnar Motor (Abd Dig Min)  31.2C  Wrist  3.0   10.6  B Elbow Wrist  *49   B Elbow  7.7   7.8  A Elbow B Elbow  56   A Elbow  9.5   7.0           Waveforms:             Clinical History: MRI CERVICAL SPINE WITHOUT  CONTRAST  TECHNIQUE: Multiplanar,  multisequence MR imaging of the cervical spine was performed. No intravenous contrast was administered.  COMPARISON:  07/28/2020 cervical spine radiographs.  FINDINGS: Alignment: Straightening of lordosis. Minimal grade 1 C4-5 and C5-6 retrolisthesis.  Vertebrae: Normal bone marrow signal intensity. No focal osseous lesion.  Cord: Normal signal and morphology.  Posterior Fossa, vertebral arteries: Negative.  Disc levels: Multilevel desiccation with mild disc space loss most prominent at the C3-6 levels.  C2-3: No significant disc bulge. Uncovertebral and facet degenerative spurring. Patent spinal canal and right neural foramen. Mild left neural foraminal narrowing.  C3-4: Disc osteophyte complex with superimposed right subarticular/foraminal protrusion and right predominant uncovertebral/facet hypertrophy. Mild spinal canal and bilateral neural foraminal narrowing.  C4-5: Disc osteophyte complex with small central protrusion, uncovertebral and bilateral facet degenerative spurring. Mild spinal canal and bilateral neural foraminal narrowing.  C5-6: Disc osteophyte complex with uncovertebral and facet hypertrophy. Mild spinal canal and moderate bilateral neural foraminal narrowing.  C6-7: Bilateral uncovertebral and facet hypertrophy. Mild spinal canal and left neural foraminal narrowing. Moderate right neural foraminal narrowing.  C7-T1: No significant disc bulge. Bilateral facet degenerative spurring. Patent spinal canal and neural foramen.  Paraspinal tissues: Negative.  IMPRESSION: No acute finding within the cervical spine.  Multilevel spondylosis.  Mild C3-7 spinal canal narrowing.  Moderate bilateral C5-6 and right C6-7 neural foraminal narrowing.  Mild left C2-3, bilateral C3-5 neural foraminal narrowing.   Electronically Signed   By: Primitivo Gauze M.D.   On: 07/31/2020 17:59      Objective:  VS:  HT:    WT:   BMI:     BP:   HR: bpm  TEMP: ( )  RESP:  Physical Exam Musculoskeletal:        General: No tenderness.     Comments: Inspection reveals no atrophy of the bilateral APB or FDI or hand intrinsics. There is no swelling, color changes, allodynia or dystrophic changes. There is 5 out of 5 strength in the bilateral wrist extension, finger abduction and long finger flexion. There is intact sensation to light touch in all dermatomal and peripheral nerve distributions.  There is a negative Hoffmann's test bilaterally.  Skin:    General: Skin is warm and dry.     Findings: No erythema or rash.  Neurological:     General: No focal deficit present.     Mental Status: He is alert and oriented to person, place, and time.     Sensory: No sensory deficit.     Motor: No weakness or abnormal muscle tone.     Coordination: Coordination normal.     Gait: Gait normal.  Psychiatric:        Mood and Affect: Mood normal.        Behavior: Behavior normal.        Thought Content: Thought content normal.      Imaging: No results found.

## 2021-01-06 NOTE — Progress Notes (Signed)
Pt state neck and right shoulder pain. Pt state it hard for him to sleep at night. Pt state his right shoulder hurts all the time. Pt state he take pain meds and uses ice to help ease the pain. Pt state he has pain in his lower back that's always there. Pt state his right handed.  Numeric Pain Rating Scale and Functional Assessment Average Pain 10   In the last MONTH (on 0-10 scale) has pain interfered with the following?  1. General activity like being  able to carry out your everyday physical activities such as walking, climbing stairs, carrying groceries, or moving a chair?  Rating(10)

## 2021-01-09 ENCOUNTER — Ambulatory Visit (INDEPENDENT_AMBULATORY_CARE_PROVIDER_SITE_OTHER): Payer: Commercial Managed Care - PPO | Admitting: Nurse Practitioner

## 2021-01-09 ENCOUNTER — Encounter: Payer: Self-pay | Admitting: Nurse Practitioner

## 2021-01-09 ENCOUNTER — Other Ambulatory Visit: Payer: Self-pay

## 2021-01-09 VITALS — BP 124/82 | HR 61 | Ht 69.0 in | Wt 167.0 lb

## 2021-01-09 DIAGNOSIS — Z125 Encounter for screening for malignant neoplasm of prostate: Secondary | ICD-10-CM

## 2021-01-09 DIAGNOSIS — G8929 Other chronic pain: Secondary | ICD-10-CM

## 2021-01-09 DIAGNOSIS — I214 Non-ST elevation (NSTEMI) myocardial infarction: Secondary | ICD-10-CM

## 2021-01-09 DIAGNOSIS — I10 Essential (primary) hypertension: Secondary | ICD-10-CM

## 2021-01-09 DIAGNOSIS — Z794 Long term (current) use of insulin: Secondary | ICD-10-CM

## 2021-01-09 DIAGNOSIS — E119 Type 2 diabetes mellitus without complications: Secondary | ICD-10-CM | POA: Diagnosis not present

## 2021-01-09 DIAGNOSIS — K6289 Other specified diseases of anus and rectum: Secondary | ICD-10-CM

## 2021-01-09 DIAGNOSIS — K921 Melena: Secondary | ICD-10-CM

## 2021-01-09 DIAGNOSIS — I249 Acute ischemic heart disease, unspecified: Secondary | ICD-10-CM

## 2021-01-09 DIAGNOSIS — Z0000 Encounter for general adult medical examination without abnormal findings: Secondary | ICD-10-CM

## 2021-01-09 NOTE — Progress Notes (Signed)
[place], [place]  [address] Phone:  [phone]   Fax:  [phone]    Established Patient Office Visit  Subjective:  Patient ID: Dean Robertson, male    DOB: 1961/03/15  Age: 60 y.o. MRN: [id number]  CC:  Chief Complaint  Patient presents with  . Annual Exam  . Pain Management    HPI Dean Robertson presents for annual exam. He  has a past medical history of Anxiety, Arthritis, Coronary artery disease, Diabetes mellitus without complication ([place]), H/O heart artery stent, High cholesterol, and Hypertension.   Patient is in today for hospital follow-up. Recently admitted for non-ST elevation myocardial infarction with multivessel coronary artery disease,acute coronary syndrome.  He is status post PCI to mid LAD and proximal portion of distal artery.  Hospital course of treatment was from 12/22/2020 to 12/25/2020.  During hospital course he underwent a cardiac cath.  He was discharged on aspirin and Plavix.  Plavix recommendation the next 1 to 2 years.  He is to follow-up with cardiology.  His simvastatin was changed to a atorvastatin 80 mg.         Past Medical History:  Diagnosis Date  . Anxiety   . Arthritis    shoulders  . Coronary artery disease   . Diabetes mellitus without complication ([place])   . H/O heart artery stent    x1  . High cholesterol   . Hypertension     Past Surgical History:  Procedure Laterality Date  . COLONOSCOPY     2012- normal   . CORONARY ANGIOPLASTY WITH STENT PLACEMENT    . CORONARY STENT INTERVENTION N/A 12/23/2020   Procedure: CORONARY STENT INTERVENTION;  Surgeon: Sherren Mocha, MD;  Location: [facility];  Service: Cardiovascular;  Laterality: N/A;  . LEFT HEART CATH AND CORONARY ANGIOGRAPHY N/A 12/23/2020   Procedure: LEFT HEART CATH AND CORONARY ANGIOGRAPHY;  Surgeon: Charolette Forward, MD;  Location: [facility];  Service: Cardiovascular;  Laterality: N/A;    Family History   Problem Relation Age of Onset  . Hypertension Mother   . Diabetes Father   . Colon polyps Neg Hx   . Colon cancer Neg Hx   . Esophageal cancer Neg Hx   . Rectal cancer Neg Hx   . Stomach cancer Neg Hx     Social History   Socioeconomic History  . Marital status: Legally Separated    Spouse name: Not on file  . Number of children: Not on file  . Years of education: Not on file  . Highest education level: Not on file  Occupational History  . Not on file  Tobacco Use  . Smoking status: Former Research scientist (life sciences)  . Smokeless tobacco: Never Used  Substance and Sexual Activity  . Alcohol use: Yes    Comment: occ  . Drug use: Not Currently  . Sexual activity: Not on file  Other Topics Concern  . Not on file  Social History Narrative  . Not on file   Social Determinants of Health   Financial Resource Strain: Not on file  Food Insecurity: Not on file  Transportation Needs: Not on file  Physical Activity: Not on file  Stress: Not on file  Social Connections: Not on file  Intimate Partner Violence: Not on file    Outpatient Medications Prior to Visit  Medication Sig Dispense Refill  . acetaminophen (TYLENOL) 500 MG tablet Take 1 tablet (500 mg total) by mouth every 6 (six)  hours as needed. (Patient taking differently: Take 500 mg by mouth every 6 (six) hours as needed for mild pain.) 30 tablet 0  . aspirin EC 81 MG tablet Take 81 mg by mouth daily.    Marland Kitchen atorvastatin (LIPITOR) 80 MG tablet Take 1 tablet (80 mg total) by mouth daily. 30 tablet 3  . Blood Glucose Monitoring Suppl (TRUE METRIX AIR GLUCOSE METER) w/Device KIT 1 each by Does not apply route 3 (three) times daily. 1 kit 0  . busPIRone (BUSPAR) 10 MG tablet Take 1 tablet (10 mg total) by mouth 2 (two) times daily. 60 tablet 3  . clopidogrel (PLAVIX) 75 MG tablet Take 1 tablet (75 mg total) by mouth daily with breakfast. 75 tablet 3  . cyclobenzaprine (FLEXERIL) 5 MG tablet Take 1 tablet (5 mg total) by mouth 3 (three) times  daily as needed for muscle spasms. 30 tablet 1  . diclofenac Sodium (VOLTAREN) 1 % GEL Apply 2 g topically 4 (four) times daily. (Patient taking differently: Apply 2 g topically 4 (four) times daily as needed (pain).) 350 g 3  . gabapentin (NEURONTIN) 300 MG capsule TAKE 1 CAPSULE(300 MG) BY MOUTH THREE TIMES DAILY (Patient taking differently: Take 300 mg by mouth 3 (three) times daily as needed (pain).) 270 capsule 3  . glucose blood (TRUE METRIX BLOOD GLUCOSE TEST) test strip Use as instructed 100 each 12  . insulin glargine (LANTUS) 100 UNIT/ML injection Inject 0.3 mLs (30 Units total) into the skin at bedtime. 10 mL   . isosorbide mononitrate (IMDUR) 30 MG 24 hr tablet Take 30 mg by mouth every morning.    . Lancets MISC 1 each 3 (three) times daily by Does not apply route. 100 each 11  . lisinopril (ZESTRIL) 20 MG tablet TAKE 1 TABLET(20 MG) BY MOUTH DAILY (Patient taking differently: Take 20 mg by mouth daily. TAKE 1 TABLET(20 MG) BY MOUTH DAILY) 90 tablet 3  . Menthol, Topical Analgesic, (BIOFREEZE) 4 % GEL Apply 1 application topically 4 (four) times daily as needed. (Patient taking differently: Apply 1 application topically 4 (four) times daily as needed (shoulder pain).) 89 mL 3  . metFORMIN (GLUCOPHAGE) 1000 MG tablet Take 1 tablet (1,000 mg total) by mouth 2 (two) times daily with a meal. Starting Monday. 180 tablet 3  . Multiple Vitamins-Minerals (VITRUM SENIOR) TABS Take 1 tablet by mouth daily.    . nitroGLYCERIN (NITROSTAT) 0.4 MG SL tablet Place 1 tablet (0.4 mg total) under the tongue every 5 (five) minutes x 3 doses as needed for chest pain. 25 tablet 1  . propranolol (INDERAL) 40 MG tablet Take 1 tablet (40 mg total) by mouth 2 (two) times daily. As needed for anxiety (Patient taking differently: Take 40 mg by mouth 2 (two) times daily as needed (anxiety). As needed for anxiety) 60 tablet 6   No facility-administered medications prior to visit.    Allergies  Allergen Reactions   . Tramadol Nausea And Vomiting    ROS Review of Systems  Gastrointestinal: Positive for blood in stool.  Musculoskeletal: Positive for joint swelling (right shoulder and left hip).  Neurological: Negative for dizziness and headaches (related to new medication regimen).      Objective:    Physical Exam Constitutional:      Appearance: He is normal weight.  HENT:     Head: Normocephalic.  Cardiovascular:     Rate and Rhythm: Normal rate and regular rhythm.     Heart sounds: Murmur heard.  Pulmonary:     Effort: Pulmonary effort is normal.     Breath sounds: Normal breath sounds.  Genitourinary:    Prostate: Normal.     Rectum: Normal. Guaiac result negative.  Musculoskeletal:     Cervical back: Normal range of motion.  Skin:    General: Skin is warm.     Capillary Refill: Capillary refill takes less than 2 seconds.  Neurological:     General: No focal deficit present.     Mental Status: He is alert and oriented to person, place, and time.  Psychiatric:        Mood and Affect: Mood normal.        Behavior: Behavior normal.        Thought Content: Thought content normal.        Judgment: Judgment normal.     BP 124/82   Pulse 61   Ht _0  (1.753 m)   Wt 167 lb (75.8 kg)   SpO2 100%   BMI 24.66 kg/m  Wt Readings from Last 3 Encounters:  01/09/21 167 lb (75.8 kg)  12/22/20 169 lb 5 oz (76.8 kg)  12/19/20 165 lb (74.8 kg)     There are no preventive care reminders to display for this patient.  There are no preventive care reminders to display for this patient.  Lab Results  Component Value Date   TSH 0.605 05/06/2020   Lab Results  Component Value Date   WBC 5.0 12/25/2020   HGB 14.5 12/25/2020   HCT 41.6 12/25/2020   MCV 86.7 12/25/2020   PLT 250 12/25/2020   Lab Results  Component Value Date   NA 138 12/25/2020   K 4.4 12/25/2020   CO2 25 12/25/2020   GLUCOSE 181 (H) 12/25/2020   BUN 14 12/25/2020   CREATININE 1.11 12/25/2020    BILITOT <0.2 12/19/2020   ALKPHOS 78 12/19/2020   AST 15 12/19/2020   ALT 28 09/26/2020   PROT 6.8 12/19/2020   ALBUMIN 4.4 12/19/2020   CALCIUM 9.3 12/25/2020   ANIONGAP 11 12/25/2020   Lab Results  Component Value Date   CHOL 217 (H) 12/23/2020   Lab Results  Component Value Date   HDL 57 12/23/2020   Lab Results  Component Value Date   LDLCALC 106 (H) 12/23/2020   Lab Results  Component Value Date   TRIG 270 (H) 12/23/2020   Lab Results  Component Value Date   CHOLHDL 3.8 12/23/2020   Lab Results  Component Value Date   HGBA1C 10.0 (H) 12/22/2020      Assessment & Plan:   Problem List Items Addressed This Visit      Cardiovascular and Mediastinum   Non-ST elevation (NSTEMI) myocardial infarction ([place]) Follow up with cardiology as scheduled  Status post PCI   Relevant Medications   isosorbide mononitrate (IMDUR) 30 MG 24 hr tablet   Acute coronary syndrome ([facility]) Stable fu with cardiology as scheduled   Relevant Medications   isosorbide mononitrate (IMDUR) 30 MG 24 hr tablet   Essential hypertension Stable we will continue with current regimen   Relevant Medications   isosorbide mononitrate (IMDUR) 30 MG 24 hr tablet     Endocrine   Type 2 diabetes mellitus without complication, with long-term current use of insulin ([facility]) - Primary Stable last A1c improved form 11.7 to 9.8% with insulin adjustment will continue on current regimen and follow up in 3 months. Hopeful with continued improvement  Discussed the importance of glycemic control and coronary  artery disease.    Other Visit Diagnoses    Physical exam, annual     Exam completed no abnormal findings.    Screening for malignant neoplasm of prostate       Relevant Orders   PSA   Blood in stool    Will call [facility] for follow up. However this is probably due to Plavix and ASA therapy     Rectal pain, chronic     Encourage pt to call and make an apt with GI. He does have some nerve impingement to is  lumbar spine For which he will follow up with orthopedics in a few weeks.       No orders of the defined types were placed in this encounter.   Follow-up: Return in about 3 months (around 04/11/2021) for follow up [id number].    Vevelyn Francois, NP

## 2021-01-09 NOTE — Patient Instructions (Addendum)
[facility] Phone: [phone]   Health Maintenance, Male Adopting a healthy lifestyle and getting preventive care are important in promoting health and wellness. Ask your health care provider about:  The right schedule for you to have regular tests and exams.  Things you can do on your own to prevent diseases and keep yourself healthy. What should I know about diet, weight, and exercise? Eat a healthy diet  Eat a diet that includes plenty of vegetables, fruits, low-fat dairy products, and lean protein.  Do not eat a lot of foods that are high in solid fats, added sugars, or sodium.   Maintain a healthy weight Body mass index (BMI) is a measurement that can be used to identify possible weight problems. It estimates body fat based on height and weight. Your health care provider can help determine your BMI and help you achieve or maintain a healthy weight. Get regular exercise Get regular exercise. This is one of the most important things you can do for your health. Most adults should:  Exercise for at least 150 minutes each week. The exercise should increase your heart rate and make you sweat (moderate-intensity exercise).  Do strengthening exercises at least twice a week. This is in addition to the moderate-intensity exercise.  Spend less time sitting. Even light physical activity can be beneficial. Watch cholesterol and blood lipids Have your blood tested for lipids and cholesterol at 61 years of age, then have this test every 5 years. You may need to have your cholesterol levels checked more often if:  Your lipid or cholesterol levels are high.  You are older than 61 years of age.  You are at high risk for heart disease. What should I know about cancer screening? Many types of cancers can be detected early and may often be prevented. Depending on your health history and family history, you may need to have cancer screening at various ages. This may  include screening for:  Colorectal cancer.  Prostate cancer.  Skin cancer.  Lung cancer. What should I know about heart disease, diabetes, and high blood pressure? Blood pressure and heart disease  High blood pressure causes heart disease and increases the risk of stroke. This is more likely to develop in people who have high blood pressure readings, are of African descent, or are overweight.  Talk with your health care provider about your target blood pressure readings.  Have your blood pressure checked: ? Every 3-5 years if you are 37-44 years of age. ? Every year if you are 79 years old or older.  If you are between the ages of 61 and 87 and are a current or former smoker, ask your health care provider if you should have a one-time screening for abdominal aortic aneurysm (AAA). Diabetes Have regular diabetes screenings. This checks your fasting blood sugar level. Have the screening done:  Once every three years after age 53 if you are at a normal weight and have a low risk for diabetes.  More often and at a younger age if you are overweight or have a high risk for diabetes. What should I know about preventing infection? Hepatitis B If you have a higher risk for hepatitis B, you should be screened for this virus. Talk with your health care provider to find out if you are at risk for hepatitis B infection. Hepatitis C Blood testing is recommended for:  Everyone born from [birth year] through 1966.  Anyone with known risk factors for hepatitis C. Sexually transmitted infections (  STIs)  You should be screened each year for STIs, including gonorrhea and chlamydia, if: ? You are sexually active and are younger than 61 years of age. ? You are older than 61 years of age and your health care provider tells you that you are at risk for this type of infection. ? Your sexual activity has changed since you were last screened, and you are at increased risk for chlamydia or gonorrhea. Ask your  health care provider if you are at risk.  Ask your health care provider about whether you are at high risk for HIV. Your health care provider may recommend a prescription medicine to help prevent HIV infection. If you choose to take medicine to prevent HIV, you should first get tested for HIV. You should then be tested every 3 months for as long as you are taking the medicine. Follow these instructions at home: Lifestyle  Do not use any products that contain nicotine or tobacco, such as cigarettes, e-cigarettes, and chewing tobacco. If you need help quitting, ask your health care provider.  Do not use street drugs.  Do not share needles.  Ask your health care provider for help if you need support or information about quitting drugs. Alcohol use  Do not drink alcohol if your health care provider tells you not to drink.  If you drink alcohol: ? Limit how much you have to 0-2 drinks a day. ? Be aware of how much alcohol is in your drink. In the [place] one drink equals one 12 oz bottle of beer (355 mL), one 5 oz glass of wine (148 mL), or one 1 oz glass of hard liquor (44 mL). General instructions  Schedule regular health, dental, and eye exams.  Stay current with your vaccines.  Tell your health care provider if: ? You often feel depressed. ? You have ever been abused or do not feel safe at home. Summary  Adopting a healthy lifestyle and getting preventive care are important in promoting health and wellness.  Follow your health care provider's instructions about healthy diet, exercising, and getting tested or screened for diseases.  Follow your health care provider's instructions on monitoring your cholesterol and blood pressure. This information is not intended to replace advice given to you by your health care provider. Make sure you discuss any questions you have with your health care provider. Document Revised: 10/15/2018 Document Reviewed: 10/15/2018 Elsevier Patient Education   2021 [employer].   Diabetes Mellitus and Nutrition, Adult When you have diabetes, or diabetes mellitus, it is very important to have healthy eating habits because your blood sugar (glucose) levels are greatly affected by what you eat and drink. Eating healthy foods in the right amounts, at about the same times every day, can help you:  Control your blood glucose.  Lower your risk of heart disease.  Improve your blood pressure.  Reach or maintain a healthy weight. What can affect my meal plan? Every person with diabetes is different, and each person has different needs for a meal plan. Your health care provider may recommend that you work with a dietitian to make a meal plan that is best for you. Your meal plan may vary depending on factors such as:  The calories you need.  The medicines you take.  Your weight.  Your blood glucose, blood pressure, and cholesterol levels.  Your activity level.  Other health conditions you have, such as heart or kidney disease. How do carbohydrates affect me? Carbohydrates, also called carbs, affect  your blood glucose level more than any other type of food. Eating carbs naturally raises the amount of glucose in your blood. Carb counting is a method for keeping track of how many carbs you eat. Counting carbs is important to keep your blood glucose at a healthy level, especially if you use insulin or take certain oral diabetes medicines. It is important to know how many carbs you can safely have in each meal. This is different for every person. Your dietitian can help you calculate how many carbs you should have at each meal and for each snack. How does alcohol affect me? Alcohol can cause a sudden decrease in blood glucose (hypoglycemia), especially if you use insulin or take certain oral diabetes medicines. Hypoglycemia can be a life-threatening condition. Symptoms of hypoglycemia, such as sleepiness, dizziness, and confusion, are similar to symptoms  of having too much alcohol.  Do not drink alcohol if: ? Your health care provider tells you not to drink. ? You are pregnant, may be pregnant, or are planning to become pregnant.  If you drink alcohol: ? Do not drink on an empty stomach. ? Limit how much you use to:  0-1 drink a day for women.  0-2 drinks a day for men. ? Be aware of how much alcohol is in your drink. In the [place] one drink equals one 12 oz bottle of beer (355 mL), one 5 oz glass of wine (148 mL), or one 1 oz glass of hard liquor (44 mL). ? Keep yourself hydrated with water, diet soda, or unsweetened iced tea.  Keep in mind that regular soda, juice, and other mixers may contain a lot of sugar and must be counted as carbs. What are tips for following this plan? Reading food labels  Start by checking the serving size on the "Nutrition Facts" label of packaged foods and drinks. The amount of calories, carbs, fats, and other nutrients listed on the label is based on one serving of the item. Many items contain more than one serving per package.  Check the total grams (g) of carbs in one serving. You can calculate the number of servings of carbs in one serving by dividing the total carbs by 15. For example, if a food has 30 g of total carbs per serving, it would be equal to 2 servings of carbs.  Check the number of grams (g) of saturated fats and trans fats in one serving. Choose foods that have a low amount or none of these fats.  Check the number of milligrams (mg) of salt (sodium) in one serving. Most people should limit total sodium intake to less than 2,300 mg per day.  Always check the nutrition information of foods labeled as "low-fat" or "nonfat." These foods may be higher in added sugar or refined carbs and should be avoided.  Talk to your dietitian to identify your daily goals for nutrients listed on the label. Shopping  Avoid buying canned, pre-made, or processed foods. These foods tend to be high in fat,  sodium, and added sugar.  Shop around the outside edge of the grocery store. This is where you will most often find fresh fruits and vegetables, bulk grains, fresh meats, and fresh dairy. Cooking  Use low-heat cooking methods, such as baking, instead of high-heat cooking methods like deep frying.  Cook using healthy oils, such as olive, canola, or sunflower oil.  Avoid cooking with butter, cream, or high-fat meats. Meal planning  Eat meals and snacks regularly, preferably at the  same times every day. Avoid going long periods of time without eating.  Eat foods that are high in fiber, such as fresh fruits, vegetables, beans, and whole grains. Talk with your dietitian about how many servings of carbs you can eat at each meal.  Eat 4-6 oz (112-168 g) of lean protein each day, such as lean meat, chicken, fish, eggs, or tofu. One ounce (oz) of lean protein is equal to: ? 1 oz (28 g) of meat, chicken, or fish. ? 1 egg. ?  cup (62 g) of tofu.  Eat some foods each day that contain healthy fats, such as avocado, nuts, seeds, and fish.   What foods should I eat? Fruits Berries. Apples. Oranges. Peaches. Apricots. Plums. Grapes. Mango. Papaya. Pomegranate. Kiwi. Cherries. Vegetables Lettuce. Spinach. Leafy greens, including kale, chard, collard greens, and mustard greens. Beets. Cauliflower. Cabbage. Broccoli. Carrots. Green beans. Tomatoes. Peppers. Onions. Cucumbers. Brussels sprouts. Grains Whole grains, such as whole-wheat or whole-grain bread, crackers, tortillas, cereal, and pasta. Unsweetened oatmeal. Quinoa. Brown or wild rice. Meats and other proteins Seafood. Poultry without skin. Lean cuts of poultry and beef. Tofu. Nuts. Seeds. Dairy Low-fat or fat-free dairy products such as milk, yogurt, and cheese. The items listed above may not be a complete list of foods and beverages you can eat. Contact a dietitian for more information. What foods should I avoid? Fruits Fruits canned with  syrup. Vegetables Canned vegetables. Frozen vegetables with butter or cream sauce. Grains Refined white flour and flour products such as bread, pasta, snack foods, and cereals. Avoid all processed foods. Meats and other proteins Fatty cuts of meat. Poultry with skin. Breaded or fried meats. Processed meat. Avoid saturated fats. Dairy Full-fat yogurt, cheese, or milk. Beverages Sweetened drinks, such as soda or iced tea. The items listed above may not be a complete list of foods and beverages you should avoid. Contact a dietitian for more information. Questions to ask a health care provider  Do I need to meet with a diabetes educator?  Do I need to meet with a dietitian?  What number can I call if I have questions?  When are the best times to check my blood glucose? Where to find more information:  American Diabetes Association: diabetes.org  Academy of Nutrition and Dietetics: www.eatright.[employer] of Diabetes and Digestive and Kidney Diseases: [employer]  Association of Diabetes Care and Education Specialists: www.diabeteseducator.org Summary  It is important to have healthy eating habits because your blood sugar (glucose) levels are greatly affected by what you eat and drink.  A healthy meal plan will help you control your blood glucose and maintain a healthy lifestyle.  Your health care provider may recommend that you work with a dietitian to make a meal plan that is best for you.  Keep in mind that carbohydrates (carbs) and alcohol have immediate effects on your blood glucose levels. It is important to count carbs and to use alcohol carefully. This information is not intended to replace advice given to you by your health care provider. Make sure you discuss any questions you have with your health care provider. Document Revised: 09/29/2019 Document Reviewed: 09/29/2019 Elsevier Patient Education  2021 [employer].

## 2021-01-10 LAB — PSA: Prostate Specific Ag, Serum: 1 ng/mL (ref 0.0–4.0)

## 2021-01-10 NOTE — Procedures (Signed)
EMG & NCV Findings: Evaluation of the right median motor nerve showed prolonged distal onset latency (4.8 ms), reduced amplitude (4.7 mV), and decreased conduction velocity (Elbow-Wrist, 40 m/s).  The right ulnar motor nerve showed decreased conduction velocity (B Elbow-Wrist, 49 m/s).  The right median (across palm) sensory nerve showed no response (Palm), prolonged distal peak latency (7.3 ms), and reduced amplitude (9.0 V).  All remaining nerves (as indicated in the following tables) were within normal limits.    All examined muscles (as indicated in the following table) showed no evidence of electrical instability.    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   There is no significant electrodiagnostic evidence of any other focal nerve entrapment, brachial plexopathy or cervical radiculopathy.   Recommendations: 1.  Follow-up with referring physician. 2.  Continue current management of symptoms. 3.  Continue use of resting splint at night-time and as needed during the day. 4.  Suggest surgical evaluation.  ___________________________ Laurence Spates FAAPMR Board Certified, American Board of Physical Medicine and Rehabilitation    Nerve Conduction Studies Anti Sensory Summary Table   Stim Site NR Peak (ms) Norm Peak (ms) P-T Amp (V) Norm P-T Amp Site1 Site2 Delta-P (ms) Dist (cm) Vel (m/s) Norm Vel (m/s)  Right Median Acr Palm Anti Sensory (2nd Digit)  31C  Wrist    *7.3 <3.6 *9.0 >10 Wrist Palm  0.0    Palm *NR  <2.0          Right Radial Anti Sensory (Base 1st Digit)  30.8C  Wrist    2.3 <3.1 21.6  Wrist Base 1st Digit 2.3 0.0    Right Ulnar Anti Sensory (5th Digit)  31.5C  Wrist    3.7 <3.7 16.0 >15.0 Wrist 5th Digit 3.7 14.0 38 >38   Motor Summary Table   Stim Site NR Onset (ms) Norm Onset (ms) O-P Amp (mV)  Norm O-P Amp Site1 Site2 Delta-0 (ms) Dist (cm) Vel (m/s) Norm Vel (m/s)  Right Median Motor (Abd Poll Brev)  31.1C    Martin-Gruber  Wrist    *4.8 <4.2 *4.7 >5 Elbow Wrist 5.7 23.0 *40 >50  Elbow    10.5  2.0         Right Ulnar Motor (Abd Dig Min)  31.2C  Wrist    3.0 <4.2 10.6 >3 B Elbow Wrist 4.7 23.0 *49 >53  B Elbow    7.7  7.8  A Elbow B Elbow 1.8 10.0 56 >53  A Elbow    9.5  7.0          EMG   Side Muscle Nerve Root Ins Act Fibs Psw Amp Dur Poly Recrt Int Fraser Din Comment  Right 1stDorInt Ulnar C8-T1 Nml Nml Nml Nml Nml 0 Nml Nml   Right Abd Poll Brev Median C8-T1 Nml Nml Nml Nml Nml 0 Nml Nml   Right ExtDigCom   Nml Nml Nml Nml Nml 0 Nml Nml   Right Triceps Radial C6-7-8 Nml Nml Nml Nml Nml 0 Nml Nml   Right Deltoid Axillary C5-6 Nml Nml Nml Nml Nml 0 Nml Nml     Nerve Conduction Studies Anti Sensory Left/Right Comparison   Stim Site L Lat (ms) R Lat (ms) L-R Lat (ms) L Amp (V) R Amp (V) L-R Amp (%) Site1 Site2 L Vel (m/s) R Vel (m/s) L-R Vel (m/s)  Median  Acr Palm Anti Sensory (2nd Digit)  31C  Wrist  *7.3   *9.0  Wrist Palm     Palm             Radial Anti Sensory (Base 1st Digit)  30.8C  Wrist  2.3   21.6  Wrist Base 1st Digit     Ulnar Anti Sensory (5th Digit)  31.5C  Wrist  3.7   16.0  Wrist 5th Digit  38    Motor Left/Right Comparison   Stim Site L Lat (ms) R Lat (ms) L-R Lat (ms) L Amp (mV) R Amp (mV) L-R Amp (%) Site1 Site2 L Vel (m/s) R Vel (m/s) L-R Vel (m/s)  Median Motor (Abd Poll Brev)  31.1C    Martin-Gruber  Wrist  *4.8   *4.7  Elbow Wrist  *40   Elbow  10.5   2.0        Ulnar Motor (Abd Dig Min)  31.2C  Wrist  3.0   10.6  B Elbow Wrist  *49   B Elbow  7.7   7.8  A Elbow B Elbow  56   A Elbow  9.5   7.0           Waveforms:

## 2021-01-25 ENCOUNTER — Other Ambulatory Visit: Payer: Self-pay

## 2021-01-25 ENCOUNTER — Encounter: Payer: Self-pay | Admitting: Specialist

## 2021-01-25 ENCOUNTER — Ambulatory Visit (INDEPENDENT_AMBULATORY_CARE_PROVIDER_SITE_OTHER): Payer: Commercial Managed Care - PPO | Admitting: Specialist

## 2021-01-25 VITALS — BP 126/90 | HR 90 | Ht 69.0 in | Wt 167.0 lb

## 2021-01-25 DIAGNOSIS — M5116 Intervertebral disc disorders with radiculopathy, lumbar region: Secondary | ICD-10-CM

## 2021-01-25 DIAGNOSIS — R202 Paresthesia of skin: Secondary | ICD-10-CM

## 2021-01-25 DIAGNOSIS — R200 Anesthesia of skin: Secondary | ICD-10-CM

## 2021-01-25 DIAGNOSIS — G5602 Carpal tunnel syndrome, left upper limb: Secondary | ICD-10-CM | POA: Diagnosis not present

## 2021-01-25 MED ORDER — METHYLPREDNISOLONE 4 MG PO TABS: ORAL_TABLET | ORAL | 0 refills | Status: DC

## 2021-01-25 NOTE — Patient Instructions (Signed)
Avoid bending, stooping and avoid lifting weights greater than 10 lbs. Avoid prolong standing and walking. Avoid frequent bending and stooping  No lifting greater than 10 lbs. May use ice or moist heat for pain. Weight loss is of benefit.  Dr. Romona Curls secretary/Assistant will call to arrange for epidural steroid injection  Carpal Tunnel Syndrome  Carpal tunnel syndrome is a condition that causes pain in your hand and arm. The carpal tunnel is a narrow area located on the palm side of your wrist. Repeated wrist motion or certain diseases may cause swelling within the tunnel. This swelling pinches the main nerve in the wrist (median nerve). What are the causes? This condition may be caused by:  Repeated wrist motions.  Wrist injuries.  Arthritis.  A cyst or tumor in the carpal tunnel.  Fluid buildup during pregnancy. Sometimes the cause of this condition is not known. What increases the risk? This condition is more likely to develop in:  People who have jobs that cause them to repeatedly move their wrists in the same motion, such as Art gallery manager.  Women.  People with certain conditions, such as: ? Diabetes. ? Obesity. ? An underactive thyroid (hypothyroidism). ? Kidney failure. What are the signs or symptoms? Symptoms of this condition include:  A tingling feeling in your fingers, especially in your thumb, index, and middle fingers.  Tingling or numbness in your hand.  An aching feeling in your entire arm, especially when your wrist and elbow are bent for long periods of time.  Wrist pain that goes up your arm to your shoulder.  Pain that goes down into your palm or fingers.  A weak feeling in your hands. You may have trouble grabbing and holding items. Your symptoms may feel worse during the night. How is this diagnosed? This condition is diagnosed with a medical history and physical exam. You may also have tests, including:  An electromyogram (EMG).  This test measures electrical signals sent by your nerves into the muscles.  X-rays. How is this treated? Treatment for this condition includes:  Lifestyle changes. It is important to stop doing or modify the activity that caused your condition.  Physical or occupational therapy.  Medicines for pain and inflammation. This may include medicine that is injected into your wrist.  A wrist splint.  Surgery. Follow these instructions at home: If you have a splint:   Wear it as told by your health care provider. Remove it only as told by your health care provider.  Loosen the splint if your fingers become numb and tingle, or if they turn cold and blue.  Keep the splint clean and dry. General instructions   Take over-the-counter and prescription medicines only as told by your health care provider.  Rest your wrist from any activity that may be causing your pain. If your condition is work related, talk to your employer about changes that can be made, such as getting a wrist pad to use while typing.  If directed, apply ice to the painful area: ? Put ice in a plastic bag. ? Place a towel between your skin and the bag. ? Leave the ice on for 20 minutes, 2-3 times per day.  Keep all follow-up visits as told by your health care provider. This is important.  Do any exercises as told by your health care provider, physical therapist, or occupational therapist. Contact a health care provider if:  You have new symptoms.  Your pain is not controlled with medicines.  Your  symptoms get worse. This information is not intended to replace advice given to you by your health care provider. Make sure you discuss any questions you have with your health care provider. Document Released: 10/19/2000 Document Revised: 03/01/2016 Document Reviewed: 07/03/2017 Elsevier Interactive Patient Education  2017 [employer].

## 2021-01-25 NOTE — Progress Notes (Signed)
Office Visit Note   Patient: Dean Robertson           Date of Birth: [birth year]           MRN: [id number] Visit Date: 01/25/2021              Requested by: Vevelyn Francois, NP [address] [place],  [place] [address] PCP: Vevelyn Francois, NP   Assessment & Plan: Visit Diagnoses:  1. Numbness and tingling of right arm   2. Carpal tunnel syndrome, left upper limb   3. Herniation of lumbar intervertebral disc with radiculopathy     Plan: Avoid bending, stooping and avoid lifting weights greater than 10 lbs. Avoid prolong standing and walking. Avoid frequent bending and stooping  No lifting greater than 10 lbs. May use ice or moist heat for pain. Weight loss is of benefit.  Dr. Romona Curls secretary/Assistant will call to arrange for epidural steroid injection  Carpal Tunnel Syndrome  Carpal tunnel syndrome is a condition that causes pain in your hand and arm. The carpal tunnel is a narrow area located on the palm side of your wrist. Repeated wrist motion or certain diseases may cause swelling within the tunnel. This swelling pinches the main nerve in the wrist (median nerve). What are the causes? This condition may be caused by:  Repeated wrist motions.  Wrist injuries.  Arthritis.  A cyst or tumor in the carpal tunnel.  Fluid buildup during pregnancy. Sometimes the cause of this condition is not known. What increases the risk? This condition is more likely to develop in:  People who have jobs that cause them to repeatedly move their wrists in the same motion, such as Art gallery manager.  Women.  People with certain conditions, such as: ? Diabetes. ? Obesity. ? An underactive thyroid (hypothyroidism). ? Kidney failure. What are the signs or symptoms? Symptoms of this condition include:  A tingling feeling in your fingers, especially in your thumb, index, and middle fingers.  Tingling or numbness in your hand.  An aching feeling in your entire arm,  especially when your wrist and elbow are bent for long periods of time.  Wrist pain that goes up your arm to your shoulder.  Pain that goes down into your palm or fingers.  A weak feeling in your hands. You may have trouble grabbing and holding items. Your symptoms may feel worse during the night. How is this diagnosed? This condition is diagnosed with a medical history and physical exam. You may also have tests, including:  An electromyogram (EMG). This test measures electrical signals sent by your nerves into the muscles.  X-rays. How is this treated? Treatment for this condition includes:  Lifestyle changes. It is important to stop doing or modify the activity that caused your condition.  Physical or occupational therapy.  Medicines for pain and inflammation. This may include medicine that is injected into your wrist.  A wrist splint.  Surgery. Follow these instructions at home: If you have a splint:   Wear it as told by your health care provider. Remove it only as told by your health care provider.  Loosen the splint if your fingers become numb and tingle, or if they turn cold and blue.  Keep the splint clean and dry. General instructions   Take over-the-counter and prescription medicines only as told by your health care provider.  Rest your wrist from any activity that may be causing your pain. If your condition is work related,  talk to your employer about changes that can be made, such as getting a wrist pad to use while typing.  If directed, apply ice to the painful area: ? Put ice in a plastic bag. ? Place a towel between your skin and the bag. ? Leave the ice on for 20 minutes, 2-3 times per day.  Keep all follow-up visits as told by your health care provider. This is important.  Do any exercises as told by your health care provider, physical therapist, or occupational therapist. Contact a health care provider if:  You have new symptoms.  Your pain is not  controlled with medicines.  Your symptoms get worse. This information is not intended to replace advice given to you by your health care provider. Make sure you discuss any questions you have with your health care provider. Document Released: 10/19/2000 Document Revised: 03/01/2016 Document Reviewed: 07/03/2017 Elsevier Interactive Patient Education  2017 [employer] Steroid Taper Follow-Up Instructions: No follow-ups on file.   Orders:  Orders Placed This Encounter  Procedures  . Ambulatory referral to Physical Medicine Rehab   No orders of the defined types were placed in this encounter.     Procedures: No procedures performed   Clinical Data: Findings:  CLINICAL DATA:  Continued low back pain for greater than 6 weeks. Osteoarthritis.  EXAM: MRI LUMBAR SPINE WITHOUT AND WITH CONTRAST  TECHNIQUE: Multiplanar and multiecho pulse sequences of the lumbar spine were obtained without and with intravenous contrast.  CONTRAST:  42mL GADAVIST GADOBUTROL 1 MMOL/ML IV SOLN  COMPARISON:  Radiographs from 07/28/2020  FINDINGS: Segmentation: The lowest lumbar type non-rib-bearing vertebra is labeled as L5.  Alignment:  No vertebral subluxation is observed.  Vertebrae: Congenitally short pedicles. Disc desiccation at L2-3, L4-5, and L5-S1 with mild loss of disc height at L2-3 and L5-S1. Type 2 degenerative endplate findings at Z6-X0. No significant vertebral marrow edema is identified.  Conus medullaris and cauda equina: Conus extends to the L1 level. Conus and cauda equina appear normal.  Paraspinal and other soft tissues: Mild sclerosis along the SI joints may reflect low-grade bilateral sacroiliitis.  Disc levels:  L1-2: Unremarkable.  L2-3: Moderate central narrowing of the thecal sac and mild to moderate left subarticular lateral recess stenosis due to short pedicles, disc bulge, and left lateral recess disc protrusion. AP diameter of the thecal sac  is narrowed to 0.8 cm.  L3-4: Mild central narrowing of the thecal sac due to short pedicles, disc bulge, and left paracentral disc protrusion. The AP diameter of the thecal sac is narrowed to 0.9 cm.  L4-5: Moderate central narrowing of the thecal sac with mild bilateral subarticular lateral recess stenosis and mild left foraminal stenosis due to diffuse disc bulge, left facet arthropathy, and congenitally short pedicles. The AP diameter of the thecal sac is narrowed to 0.7 cm.  L5-S1: Mild bilateral subarticular lateral recess stenosis and mild right foraminal stenosis due to diffuse disc bulge, intervertebral spurring, and facet spurring.  IMPRESSION: 1. Lumbar spondylosis, degenerative disc disease, and congenitally short pedicles causing moderate impingement at L2-3 and L4-5, and mild impingement at L3-4 and L5-S1. 2. Mild sclerosis along the SI joints may reflect low-grade bilateral sacroiliitis.   Electronically Signed   By: Van Clines M.D.   On: 01/02/2021 12:35     Subjective: Chief Complaint  Patient presents with  . Right Arm - Follow-up    Post EMG/NCS    60 year old male with history of left arm numbness and tingling,  he had rest pain about 3 weeks ago and left hand numbness and tingling. 3 weeks Ago the pain in the left shoulder worsened with neck and throat pain and difficulty with breathing he presented to the ER and was found to have an  Acute MI. Underwent angiography and stent placement. Chest pain is better but the left hand pain remains, also complains of pain in the lumbar spine with Radiation into the buttocks. No bowel or bladder difficulty. EMG/NCV was done left arm and shows moderate to severe left CTS.    Review of Systems   Objective: Vital Signs: BP 126/90 (BP Location: Left Arm, Patient Position: Sitting)   Pulse 90   Ht 5\' 9"  (1.753 m)   Wt 167 lb (75.8 kg)   BMI 24.66 kg/m   Physical Exam Constitutional:       Appearance: He is well-developed.  HENT:     Head: Normocephalic and atraumatic.  Eyes:     Pupils: Pupils are equal, round, and reactive to light.  Pulmonary:     Effort: Pulmonary effort is normal.     Breath sounds: Normal breath sounds.  Abdominal:     General: Bowel sounds are normal.     Palpations: Abdomen is soft.  Musculoskeletal:     Cervical back: Normal range of motion and neck supple.     Lumbar back: Negative right straight leg raise test and negative left straight leg raise test.  Skin:    General: Skin is warm and dry.  Neurological:     Mental Status: He is alert and oriented to person, place, and time.  Psychiatric:        Behavior: Behavior normal.        Thought Content: Thought content normal.        Judgment: Judgment normal.     Back Exam   Tenderness  The patient is experiencing tenderness in the lumbar.  Range of Motion  Extension: abnormal  Flexion: abnormal  Lateral bend right: normal  Lateral bend left: normal  Rotation right: normal  Rotation left: normal   Muscle Strength  Right Quadriceps:  5/5  Right Hamstrings:  5/5  Left Hamstrings:  5/5   Tests  Straight leg raise right: negative Straight leg raise left: negative  Other  Toe walk: abnormal Heel walk: abnormal Sensation: decreased Gait: abnormal  Erythema: no back redness Scars: absent  Comments:  Bilateral hip flexion weakness.       Specialty Comments:  No specialty comments available.  Imaging: No results found.   PMFS History: Patient Active Problem List   Diagnosis Date Noted  . Non-ST elevation (NSTEMI) myocardial infarction ([place])   . Acute coronary syndrome ([place]) 12/22/2020  . Tick bite 05/17/2020  . Anxiety 02/05/2019  . Essential hypertension 12/08/2017  . Type 2 diabetes mellitus without complication, with long-term current use of insulin ([place]) 12/08/2017  . Chest pain 10/04/2017  . ASHD (arteriosclerotic heart disease) 11/15/2015  .  Hyperlipidemia 11/15/2015  . Neuropathy 11/15/2015  . Sprain/strain 11/27/2013   Past Medical History:  Diagnosis Date  . Anxiety   . Arthritis    shoulders  . Coronary artery disease   . Diabetes mellitus without complication ([place] [place])   . H/O heart artery stent    x1  . High cholesterol   . Hypertension     Family History  Problem Relation Age of Onset  . Hypertension Mother   . Diabetes Father   . Colon polyps Neg Hx   .  Colon cancer Neg Hx   . Esophageal cancer Neg Hx   . Rectal cancer Neg Hx   . Stomach cancer Neg Hx     Past Surgical History:  Procedure Laterality Date  . COLONOSCOPY     2012- normal   . CORONARY ANGIOPLASTY WITH STENT PLACEMENT    . CORONARY STENT INTERVENTION N/A 12/23/2020   Procedure: CORONARY STENT INTERVENTION;  Surgeon: Sherren Mocha, MD;  Location: [facility];  Service: Cardiovascular;  Laterality: N/A;  . LEFT HEART CATH AND CORONARY ANGIOGRAPHY N/A 12/23/2020   Procedure: LEFT HEART CATH AND CORONARY ANGIOGRAPHY;  Surgeon: Charolette Forward, MD;  Location: [facility];  Service: Cardiovascular;  Laterality: N/A;   Social History   Occupational History  . Not on file  Tobacco Use  . Smoking status: Former Research scientist (life sciences)  . Smokeless tobacco: Never Used  Substance and Sexual Activity  . Alcohol use: Yes    Comment: occ  . Drug use: Not Currently  . Sexual activity: Not on file

## 2021-01-30 ENCOUNTER — Telehealth: Payer: Self-pay | Admitting: Physical Medicine and Rehabilitation

## 2021-01-30 NOTE — Telephone Encounter (Signed)
Patient needs to schedule an interlaminar epidural steroid injection with Dr. Ernestina Patches at [facility]. He will need to hold Plavix for 7 days prior to the injection . Will this be ok?

## 2021-02-06 NOTE — Telephone Encounter (Signed)
Patient needs to schedule an interlaminar epidural steroid injection with Dr. Ernestina Patches at [facility]. He will need to hold Plavix for 7 days prior to the injection . Will this be ok?

## 2021-02-23 ENCOUNTER — Ambulatory Visit (INDEPENDENT_AMBULATORY_CARE_PROVIDER_SITE_OTHER): Payer: Commercial Managed Care - PPO | Admitting: Physical Medicine and Rehabilitation

## 2021-02-23 ENCOUNTER — Encounter: Payer: Self-pay | Admitting: Physical Medicine and Rehabilitation

## 2021-02-23 ENCOUNTER — Other Ambulatory Visit: Payer: Self-pay

## 2021-02-23 ENCOUNTER — Ambulatory Visit: Payer: Self-pay

## 2021-02-23 VITALS — BP 177/90 | HR 87

## 2021-02-23 DIAGNOSIS — M5416 Radiculopathy, lumbar region: Secondary | ICD-10-CM

## 2021-02-23 MED ORDER — METHYLPREDNISOLONE ACETATE 80 MG/ML IJ SUSP
80.0000 mg | Freq: Once | INTRAMUSCULAR | Status: AC
  Administered 2021-02-23: 80 mg

## 2021-02-23 NOTE — Patient Instructions (Signed)

## 2021-02-23 NOTE — Progress Notes (Signed)
Prior emg/ncs on the right, Louanne Skye says left in his notes.Pt state lower back pain that travels to his groin area down both leg. Pt state bending and sitting make the pain worse. Pt state he takes pin meds to help ease his pain.  Numeric Pain Rating Scale and Functional Assessment Average Pain 9   In the last MONTH (on 0-10 scale) has pain interfered with the following?  1. General activity like being  able to carry out your everyday physical activities such as walking, climbing stairs, carrying groceries, or moving a chair?  Rating(10)   +Driver, +BT, -Dye Allergies.

## 2021-02-23 NOTE — Progress Notes (Signed)
Dean Robertson - 60 y.o. male MRN [id number]  Date of birth: 07-27-1961  Office Visit Note: Visit Date: 02/23/2021 PCP: Vevelyn Francois, NP Referred by: Vevelyn Francois, NP  Subjective: Chief Complaint  Patient presents with  . Lower Back - Pain  . Left Leg - Pain  . Right Leg - Pain   HPI:  Dean Robertson is a 60 y.o. male who comes in today at the request of Dr. Basil Dess for planned Bilateral L2-L3 Lumbar epidural steroid injection with fluoroscopic guidance.  The patient has failed conservative care including home exercise, medications, time and activity modification.  This injection will be diagnostic and hopefully therapeutic.  Please see requesting physician notes for further details and justification. MRI reviewed with images and spine model.  MRI reviewed in the note below.  Stenosis at L2-3 and L4-5.  Initial request by Dr. Louanne Skye was for interlaminar epidural steroid injection but Sharol Given the patient being on anticoagulation they would not let him come off of his Plavix we adjusted this to a bilateral transforaminal injection which can be done on anticoagulation.  Also just as informational purposes, reading Dr. Otho Ket last notes he talks about left carpal tunnel syndrome status post a nerve conduction study of the left upper extremity but in point of fact I saw the patient and his symptoms were on the right at the request is for the right and we actually did a right upper extremity nerve conduction and EMG.  The patient does have significant carpal tunnel of the right side.  ROS Otherwise per HPI.  Assessment & Plan: Visit Diagnoses:    ICD-10-CM   1. Lumbar radiculopathy  M54.16 XR C-ARM NO REPORT    Epidural Steroid injection    methylPREDNISolone acetate (DEPO-MEDROL) injection 80 mg    Plan: No additional findings.   Meds & Orders:  Meds ordered this encounter  Medications  . methylPREDNISolone acetate (DEPO-MEDROL) injection 80 mg    Orders Placed This Encounter   Procedures  . XR C-ARM NO REPORT  . Epidural Steroid injection    Follow-up: Return in about 2 weeks (around 03/09/2021) for Basil Dess, MD.   Procedures: No procedures performed  Lumbosacral Transforaminal Epidural Steroid Injection - Sub-Pedicular Approach with Fluoroscopic Guidance  Patient: Dean Robertson      Date of Birth: 1960/12/18 MRN: [id number] PCP: Vevelyn Francois, NP      Visit Date: 02/23/2021   Universal Protocol:    Date/Time: 02/23/2021  Consent Given By: the patient  Position: PRONE  Additional Comments: Vital signs were monitored before and after the procedure. Patient was prepped and draped in the usual sterile fashion. The correct patient, procedure, and site was verified.   Injection Procedure Details:   Procedure diagnoses: Lumbar radiculopathy [M54.16]    Meds Administered:  Meds ordered this encounter  Medications  . methylPREDNISolone acetate (DEPO-MEDROL) injection 80 mg    Laterality: Bilateral  Location/Site:  L2-L3  Needle:5.0 in., 22 ga.  Short bevel or Quincke spinal needle  Needle Placement: Transforaminal  Findings:    -Comments: Excellent flow of contrast along the nerve, nerve root and into the epidural space.  Procedure Details: After squaring off the end-plates to get a true AP view, the C-arm was positioned so that an oblique view of the foramen as noted above was visualized. The target area is just inferior to the "nose of the scotty dog" or sub pedicular. The soft tissues overlying this structure were infiltrated with 2-3 ml. of 1%  Lidocaine without Epinephrine.  The spinal needle was inserted toward the target using a "trajectory" view along the fluoroscope beam.  Under AP and lateral visualization, the needle was advanced so it did not puncture dura and was located close the 6 O'Clock position of the pedical in AP tracterory. Biplanar projections were used to confirm position. Aspiration was confirmed to be negative for  CSF and/or blood. A 1-2 ml. volume of Isovue-250 was injected and flow of contrast was noted at each level. Radiographs were obtained for documentation purposes.   After attaining the desired flow of contrast documented above, a 0.5 to 1.0 ml test dose of 0.25% Marcaine was injected into each respective transforaminal space.  The patient was observed for 90 seconds post injection.  After no sensory deficits were reported, and normal lower extremity motor function was noted,   the above injectate was administered so that equal amounts of the injectate were placed at each foramen (level) into the transforaminal epidural space.   Additional Comments:  The patient tolerated the procedure well Dressing: 2 x 2 sterile gauze and Band-Aid    Post-procedure details: Patient was observed during the procedure. Post-procedure instructions were reviewed.  Patient left the clinic in stable condition.      Clinical History: MRI LUMBAR SPINE WITHOUT AND WITH CONTRAST  TECHNIQUE: Multiplanar and multiecho pulse sequences of the lumbar spine were obtained without and with intravenous contrast.  CONTRAST:  8mL GADAVIST GADOBUTROL 1 MMOL/ML IV SOLN  COMPARISON:  Radiographs from 07/28/2020  FINDINGS: Segmentation: The lowest lumbar type non-rib-bearing vertebra is labeled as L5.  Alignment:  No vertebral subluxation is observed.  Vertebrae: Congenitally short pedicles. Disc desiccation at L2-3, L4-5, and L5-S1 with mild loss of disc height at L2-3 and L5-S1. Type 2 degenerative endplate findings at U4-Q0. No significant vertebral marrow edema is identified.  Conus medullaris and cauda equina: Conus extends to the L1 level. Conus and cauda equina appear normal.  Paraspinal and other soft tissues: Mild sclerosis along the SI joints may reflect low-grade bilateral sacroiliitis.  Disc levels:  L1-2: Unremarkable.  L2-3: Moderate central narrowing of the thecal sac and mild  to moderate left subarticular lateral recess stenosis due to short pedicles, disc bulge, and left lateral recess disc protrusion. AP diameter of the thecal sac is narrowed to 0.8 cm.  L3-4: Mild central narrowing of the thecal sac due to short pedicles, disc bulge, and left paracentral disc protrusion. The AP diameter of the thecal sac is narrowed to 0.9 cm.  L4-5: Moderate central narrowing of the thecal sac with mild bilateral subarticular lateral recess stenosis and mild left foraminal stenosis due to diffuse disc bulge, left facet arthropathy, and congenitally short pedicles. The AP diameter of the thecal sac is narrowed to 0.7 cm.  L5-S1: Mild bilateral subarticular lateral recess stenosis and mild right foraminal stenosis due to diffuse disc bulge, intervertebral spurring, and facet spurring.  IMPRESSION: 1. Lumbar spondylosis, degenerative disc disease, and congenitally short pedicles causing moderate impingement at L2-3 and L4-5, and mild impingement at L3-4 and L5-S1. 2. Mild sclerosis along the SI joints may reflect low-grade bilateral sacroiliitis.   Electronically Signed   By: Van Clines M.D.   On: 01/02/2021 12:35     Objective:  VS:  HT:    WT:   BMI:     BP:(!) 177/90  HR:87bpm  TEMP: ( )  RESP:  Physical Exam Vitals and nursing note reviewed.  Constitutional:      General: He  is not in acute distress.    Appearance: Normal appearance. He is not ill-appearing.  HENT:     Head: Normocephalic and atraumatic.     Right Ear: External ear normal.     Left Ear: External ear normal.     Nose: No congestion.  Eyes:     Extraocular Movements: Extraocular movements intact.  Cardiovascular:     Rate and Rhythm: Normal rate.     Pulses: Normal pulses.  Pulmonary:     Effort: Pulmonary effort is normal. No respiratory distress.  Abdominal:     General: There is no distension.     Palpations: Abdomen is soft.  Musculoskeletal:         General: No tenderness or signs of injury.     Cervical back: Neck supple.     Right lower leg: No edema.     Left lower leg: No edema.     Comments: Patient has good distal strength without clonus.  Skin:    Findings: No erythema or rash.  Neurological:     General: No focal deficit present.     Mental Status: He is alert and oriented to person, place, and time.     Sensory: No sensory deficit.     Motor: No weakness or abnormal muscle tone.     Coordination: Coordination normal.  Psychiatric:        Mood and Affect: Mood normal.        Behavior: Behavior normal.      Imaging: No results found.

## 2021-03-14 ENCOUNTER — Encounter: Payer: Self-pay | Admitting: Family Medicine

## 2021-03-14 ENCOUNTER — Ambulatory Visit (INDEPENDENT_AMBULATORY_CARE_PROVIDER_SITE_OTHER): Payer: Commercial Managed Care - PPO | Admitting: Family Medicine

## 2021-03-14 ENCOUNTER — Other Ambulatory Visit: Payer: Self-pay

## 2021-03-14 DIAGNOSIS — R350 Frequency of micturition: Secondary | ICD-10-CM | POA: Diagnosis not present

## 2021-03-14 DIAGNOSIS — M545 Low back pain, unspecified: Secondary | ICD-10-CM

## 2021-03-14 DIAGNOSIS — G8929 Other chronic pain: Secondary | ICD-10-CM | POA: Diagnosis not present

## 2021-03-14 NOTE — Progress Notes (Signed)
Office Visit Note   Patient: Dean Robertson           Date of Birth: 28-Apr-1961           MRN: [id number] Visit Date: 03/14/2021 Requested by: Vevelyn Francois, NP [address] [place],  [place] [address] PCP: Vevelyn Francois, NP  Subjective: Chief Complaint  Patient presents with  . Lower Back - Pain, Follow-up    S/p injections from Dr. Ernestina Patches 02/23/21. The pain is not as severe, but it is still there.    HPI: He is here for follow-up low back pain.  He has been seeing Dr. Louanne Skye, but was on my schedule today.  He had epidural injection at L2-3 per Dr. Ernestina Patches on April 21.  Patient states that it helped his pain but not a lot.  He has pain whenever he stands, in the lower lumbar area with some radiation into the groin area bilaterally.  Pain is better when sitting.  He also notes that he has lost a lot of weight unintentionally and has to urinate all the time.  He wakes up every 20 minutes at night to urinate.  He has not had any incontinence.  He does have a history of diabetes which appears to be poorly controlled.  He had a PSA drawn 2 months ago which was normal.  Denies fevers or chills.               ROS:   All other systems were reviewed and are negative.  Objective: Vital Signs: There were no vitals taken for this visit.  Physical Exam:  General:  Alert and oriented, in no acute distress. Pulm:  Breathing unlabored. Psy:  Normal mood, congruent affect. Skin: No rash Low back: He is tender bilaterally in the L5-S1 region.  No significant pain with internal hip rotation or with straight leg raise.  Lower extremity strength and reflexes are normal.    Imaging: No results found.  Assessment & Plan: 1.  Ongoing low back pain -Physical therapy referral.  Follow-up with Dr. Louanne Skye.  2.  Urinary frequency, probably due to diabetes. -Urinalysis with culture to rule out infection.     Procedures: No procedures performed        PMFS History: Patient Active Problem  List   Diagnosis Date Noted  . Non-ST elevation (NSTEMI) myocardial infarction ([place])   . Acute coronary syndrome ([place]) 12/22/2020  . Tick bite 05/17/2020  . Anxiety 02/05/2019  . Essential hypertension 12/08/2017  . Type 2 diabetes mellitus without complication, with long-term current use of insulin ([place]) 12/08/2017  . Chest pain 10/04/2017  . ASHD (arteriosclerotic heart disease) 11/15/2015  . Hyperlipidemia 11/15/2015  . Neuropathy 11/15/2015  . Sprain/strain 11/27/2013   Past Medical History:  Diagnosis Date  . Anxiety   . Arthritis    shoulders  . Coronary artery disease   . Diabetes mellitus without complication ([place])   . H/O heart artery stent    x1  . High cholesterol   . Hypertension     Family History  Problem Relation Age of Onset  . Hypertension Mother   . Diabetes Father   . Colon polyps Neg Hx   . Colon cancer Neg Hx   . Esophageal cancer Neg Hx   . Rectal cancer Neg Hx   . Stomach cancer Neg Hx     Past Surgical History:  Procedure Laterality Date  . COLONOSCOPY     2012- normal   .  CORONARY ANGIOPLASTY WITH STENT PLACEMENT    . CORONARY STENT INTERVENTION N/A 12/23/2020   Procedure: CORONARY STENT INTERVENTION;  Surgeon: Sherren Mocha, MD;  Location: [facility];  Service: Cardiovascular;  Laterality: N/A;  . LEFT HEART CATH AND CORONARY ANGIOGRAPHY N/A 12/23/2020   Procedure: LEFT HEART CATH AND CORONARY ANGIOGRAPHY;  Surgeon: Charolette Forward, MD;  Location: [facility];  Service: Cardiovascular;  Laterality: N/A;   Social History   Occupational History  . Not on file  Tobacco Use  . Smoking status: Former Research scientist (life sciences)  . Smokeless tobacco: Never Used  Substance and Sexual Activity  . Alcohol use: Yes    Comment: occ  . Drug use: Not Currently  . Sexual activity: Not on file

## 2021-03-15 ENCOUNTER — Telehealth: Payer: Self-pay | Admitting: Family Medicine

## 2021-03-15 LAB — URINALYSIS W MICROSCOPIC + REFLEX CULTURE
Bacteria, UA: NONE SEEN /HPF
Bilirubin Urine: NEGATIVE
Hgb urine dipstick: NEGATIVE
Hyaline Cast: NONE SEEN /LPF
Ketones, ur: NEGATIVE
Leukocyte Esterase: NEGATIVE
Nitrites, Initial: NEGATIVE
Protein, ur: NEGATIVE
RBC / HPF: NONE SEEN /HPF (ref 0–2)
Specific Gravity, Urine: 1.013 (ref 1.001–1.035)
Squamous Epithelial / HPF: NONE SEEN /HPF (ref <=5)
WBC, UA: NONE SEEN /HPF (ref 0–5)
pH: 6.5 (ref 5.0–8.0)

## 2021-03-15 LAB — NO CULTURE INDICATED

## 2021-03-15 NOTE — Telephone Encounter (Signed)
The urine test is notable for a high level of glucose/sugar.  No sign of infection.  The urination issues are most likely due to poorly controlled diabetes.

## 2021-03-20 NOTE — Procedures (Signed)
Lumbosacral Transforaminal Epidural Steroid Injection - Sub-Pedicular Approach with Fluoroscopic Guidance  Patient: Dean Robertson      Date of Birth: 1960/11/29 MRN: [id number] PCP: Vevelyn Francois, NP      Visit Date: 02/23/2021   Universal Protocol:    Date/Time: 02/23/2021  Consent Given By: the patient  Position: PRONE  Additional Comments: Vital signs were monitored before and after the procedure. Patient was prepped and draped in the usual sterile fashion. The correct patient, procedure, and site was verified.   Injection Procedure Details:   Procedure diagnoses: Lumbar radiculopathy [M54.16]    Meds Administered:  Meds ordered this encounter  Medications  . methylPREDNISolone acetate (DEPO-MEDROL) injection 80 mg    Laterality: Bilateral  Location/Site:  L2-L3  Needle:5.0 in., 22 ga.  Short bevel or Quincke spinal needle  Needle Placement: Transforaminal  Findings:    -Comments: Excellent flow of contrast along the nerve, nerve root and into the epidural space.  Procedure Details: After squaring off the end-plates to get a true AP view, the C-arm was positioned so that an oblique view of the foramen as noted above was visualized. The target area is just inferior to the "nose of the scotty dog" or sub pedicular. The soft tissues overlying this structure were infiltrated with 2-3 ml. of 1% Lidocaine without Epinephrine.  The spinal needle was inserted toward the target using a "trajectory" view along the fluoroscope beam.  Under AP and lateral visualization, the needle was advanced so it did not puncture dura and was located close the 6 O'Clock position of the pedical in AP tracterory. Biplanar projections were used to confirm position. Aspiration was confirmed to be negative for CSF and/or blood. A 1-2 ml. volume of Isovue-250 was injected and flow of contrast was noted at each level. Radiographs were obtained for documentation purposes.   After attaining the  desired flow of contrast documented above, a 0.5 to 1.0 ml test dose of 0.25% Marcaine was injected into each respective transforaminal space.  The patient was observed for 90 seconds post injection.  After no sensory deficits were reported, and normal lower extremity motor function was noted,   the above injectate was administered so that equal amounts of the injectate were placed at each foramen (level) into the transforaminal epidural space.   Additional Comments:  The patient tolerated the procedure well Dressing: 2 x 2 sterile gauze and Band-Aid    Post-procedure details: Patient was observed during the procedure. Post-procedure instructions were reviewed.  Patient left the clinic in stable condition.

## 2021-04-10 ENCOUNTER — Ambulatory Visit: Payer: Commercial Managed Care - PPO | Attending: Family Medicine | Admitting: Physical Therapy

## 2021-04-12 ENCOUNTER — Telehealth (INDEPENDENT_AMBULATORY_CARE_PROVIDER_SITE_OTHER): Payer: Commercial Managed Care - PPO | Admitting: Nurse Practitioner

## 2021-04-12 ENCOUNTER — Other Ambulatory Visit: Payer: Self-pay

## 2021-04-12 ENCOUNTER — Ambulatory Visit: Payer: Commercial Managed Care - PPO | Admitting: Nurse Practitioner

## 2021-04-12 DIAGNOSIS — Z09 Encounter for follow-up examination after completed treatment for conditions other than malignant neoplasm: Secondary | ICD-10-CM

## 2021-04-12 NOTE — Progress Notes (Signed)
   [place], [place]  [address] Phone:  [phone]   Fax:  [phone] Virtual Visit via Video Note  I was un able to connect with Ulyess Mort on 04/12/21 at  1:20 PM EDT by video   Vevelyn Francois, NP

## 2021-04-19 ENCOUNTER — Ambulatory Visit: Payer: Commercial Managed Care - PPO | Admitting: Physical Therapy

## 2021-05-07 ENCOUNTER — Other Ambulatory Visit: Payer: Self-pay | Admitting: Specialist

## 2021-05-09 ENCOUNTER — Other Ambulatory Visit: Payer: Self-pay | Admitting: Nurse Practitioner

## 2021-05-09 NOTE — Telephone Encounter (Signed)
error 

## 2021-05-10 ENCOUNTER — Other Ambulatory Visit: Payer: Self-pay | Admitting: Nurse Practitioner

## 2021-05-10 ENCOUNTER — Other Ambulatory Visit: Payer: Self-pay | Admitting: Specialist

## 2021-05-10 MED ORDER — COMFORT TOUCH INSULIN PEN NEED 31G X 6 MM MISC: 1.0000 "pen " | Freq: Every day | 5 refills | Status: AC

## 2021-05-10 MED ORDER — INSULIN GLARGINE 100 UNIT/ML SOLOSTAR PEN: 50.0000 [IU] | PEN_INJECTOR | Freq: Every day | SUBCUTANEOUS | 11 refills | Status: AC

## 2021-05-10 NOTE — Progress Notes (Signed)
    [facility] [address] [place], [place]  [address] Phone:  [phone]   Fax:  [phone] 

## 2021-05-10 NOTE — Progress Notes (Signed)
   [facility] [address] [place], [place]  [address] Phone:  [phone]   Fax:  [phone] 

## 2021-06-23 ENCOUNTER — Other Ambulatory Visit: Payer: Self-pay

## 2021-06-23 ENCOUNTER — Emergency Department (HOSPITAL_COMMUNITY)
Admission: EM | Admit: 2021-06-23 | Discharge: 2021-06-24 | Disposition: A | Discharge status: Home / Self Care | Payer: Commercial Managed Care - PPO | Attending: Emergency Medicine | Admitting: Emergency Medicine

## 2021-06-23 ENCOUNTER — Encounter (HOSPITAL_COMMUNITY): Payer: Self-pay | Admitting: Emergency Medicine

## 2021-06-23 DIAGNOSIS — Z79899 Other long term (current) drug therapy: Secondary | ICD-10-CM | POA: Diagnosis not present

## 2021-06-23 DIAGNOSIS — I2510 Atherosclerotic heart disease of native coronary artery without angina pectoris: Secondary | ICD-10-CM | POA: Insufficient documentation

## 2021-06-23 DIAGNOSIS — E1140 Type 2 diabetes mellitus with diabetic neuropathy, unspecified: Secondary | ICD-10-CM | POA: Insufficient documentation

## 2021-06-23 DIAGNOSIS — Z7902 Long term (current) use of antithrombotics/antiplatelets: Secondary | ICD-10-CM | POA: Insufficient documentation

## 2021-06-23 DIAGNOSIS — Z87891 Personal history of nicotine dependence: Secondary | ICD-10-CM | POA: Insufficient documentation

## 2021-06-23 DIAGNOSIS — Z7984 Long term (current) use of oral hypoglycemic drugs: Secondary | ICD-10-CM | POA: Insufficient documentation

## 2021-06-23 DIAGNOSIS — E1169 Type 2 diabetes mellitus with other specified complication: Secondary | ICD-10-CM | POA: Diagnosis not present

## 2021-06-23 DIAGNOSIS — M545 Low back pain, unspecified: Secondary | ICD-10-CM | POA: Insufficient documentation

## 2021-06-23 DIAGNOSIS — E785 Hyperlipidemia, unspecified: Secondary | ICD-10-CM | POA: Insufficient documentation

## 2021-06-23 DIAGNOSIS — Z955 Presence of coronary angioplasty implant and graft: Secondary | ICD-10-CM | POA: Insufficient documentation

## 2021-06-23 DIAGNOSIS — Z794 Long term (current) use of insulin: Secondary | ICD-10-CM | POA: Insufficient documentation

## 2021-06-23 DIAGNOSIS — M5441 Lumbago with sciatica, right side: Secondary | ICD-10-CM

## 2021-06-23 DIAGNOSIS — G8929 Other chronic pain: Secondary | ICD-10-CM | POA: Insufficient documentation

## 2021-06-23 DIAGNOSIS — M5442 Lumbago with sciatica, left side: Secondary | ICD-10-CM

## 2021-06-23 NOTE — ED Triage Notes (Signed)
Patient reports chronic low back pain for several months radiating down to both legs , denies hematuria , no injury or fall .

## 2021-06-24 LAB — URINALYSIS, ROUTINE W REFLEX MICROSCOPIC
Bacteria, UA: NONE SEEN
Bilirubin Urine: NEGATIVE
Glucose, UA: >=500 mg/dL — AB
Hgb urine dipstick: NEGATIVE
Ketones, ur: NEGATIVE mg/dL
Leukocytes,Ua: NEGATIVE
Nitrite: NEGATIVE
Protein, ur: NEGATIVE mg/dL
Specific Gravity, Urine: 1.024 (ref 1.005–1.030)
pH: 5 (ref 5.0–8.0)

## 2021-06-24 LAB — CBC WITH DIFFERENTIAL/PLATELET
Abs Immature Granulocytes: 0.01 10*3/uL (ref 0.00–0.07)
Basophils Absolute: 0.1 10*3/uL (ref 0.0–0.1)
Basophils Relative: 2 %
Eosinophils Absolute: 0.1 10*3/uL (ref 0.0–0.5)
Eosinophils Relative: 3 %
HCT: 39.7 % (ref 39.0–52.0)
Hemoglobin: 13 g/dL (ref 13.0–17.0)
Immature Granulocytes: 0 %
Lymphocytes Relative: 42 %
Lymphs Abs: 1.4 10*3/uL (ref 0.7–4.0)
MCH: 29.9 pg (ref 26.0–34.0)
MCHC: 32.7 g/dL (ref 30.0–36.0)
MCV: 91.3 fL (ref 80.0–100.0)
Monocytes Absolute: 0.3 10*3/uL (ref 0.1–1.0)
Monocytes Relative: 10 %
Neutro Abs: 1.4 10*3/uL — ABNORMAL LOW (ref 1.7–7.7)
Neutrophils Relative %: 43 %
Platelets: 268 10*3/uL (ref 150–400)
RBC: 4.35 MIL/uL (ref 4.22–5.81)
RDW: 11.8 % (ref 11.5–15.5)
WBC: 3.4 10*3/uL — ABNORMAL LOW (ref 4.0–10.5)
nRBC: 0 % (ref 0.0–0.2)

## 2021-06-24 LAB — BASIC METABOLIC PANEL
Anion gap: 10 (ref 5–15)
BUN: 24 mg/dL — ABNORMAL HIGH (ref 8–23)
CO2: 23 mmol/L (ref 22–32)
Calcium: 8.9 mg/dL (ref 8.9–10.3)
Chloride: 101 mmol/L (ref 98–111)
Creatinine, Ser: 1.07 mg/dL (ref 0.61–1.24)
GFR, Estimated: >60 mL/min (ref >60)
Glucose, Bld: 351 mg/dL — ABNORMAL HIGH (ref 70–99)
Potassium: 4.3 mmol/L (ref 3.5–5.1)
Sodium: 134 mmol/L — ABNORMAL LOW (ref 135–145)

## 2021-06-24 MED ORDER — NAPROXEN 500 MG PO TABS: 500.0000 mg | ORAL_TABLET | Freq: Two times a day (BID) | ORAL | 0 refills | Status: DC

## 2021-06-24 MED ORDER — KETOROLAC TROMETHAMINE 30 MG/ML IJ SOLN
30.0000 mg | Freq: Once | INTRAMUSCULAR | Status: AC
  Administered 2021-06-24: 30 mg via INTRAMUSCULAR
  Filled 2021-06-24: qty 1

## 2021-06-24 MED ORDER — HYDROCODONE-ACETAMINOPHEN 5-325 MG PO TABS
1.0000 | ORAL_TABLET | Freq: Once | ORAL | Status: AC
  Administered 2021-06-24: 1 via ORAL
  Filled 2021-06-24: qty 1

## 2021-06-24 MED ORDER — HYDROCODONE-ACETAMINOPHEN 5-325 MG PO TABS: 1.0000 | ORAL_TABLET | Freq: Four times a day (QID) | ORAL | 0 refills | Status: AC | PRN

## 2021-06-24 MED ORDER — ONDANSETRON 4 MG PO TBDP: 4.0000 mg | ORAL_TABLET | Freq: Three times a day (TID) | ORAL | 0 refills | Status: AC | PRN

## 2021-06-24 MED ORDER — METHOCARBAMOL 500 MG PO TABS
750.0000 mg | ORAL_TABLET | Freq: Once | ORAL | Status: AC
  Administered 2021-06-24: 750 mg via ORAL
  Filled 2021-06-24: qty 2

## 2021-06-24 MED ORDER — METHOCARBAMOL 750 MG PO TABS: 750.0000 mg | ORAL_TABLET | Freq: Two times a day (BID) | ORAL | 0 refills | Status: DC

## 2021-06-24 NOTE — ED Notes (Signed)
Patient verbalizes understanding of discharge instructions. Opportunity for questioning and answers were provided. Armband removed by staff, pt discharged from ED via wheelchair.  

## 2021-06-24 NOTE — ED Provider Notes (Signed)
[facility] EMERGENCY DEPARTMENT Provider Note   CSN: [id number] Arrival date & time: 06/23/21  2339     History Chief Complaint  Patient presents with   Back Pain    Dean Robertson is a 60 y.o. male.  Patient to ED with acute on chronic low back pain that radiates to bilateral buttocks and medial thighs. No numbness or weakness. No gait instability, urinary or bowel incontinence. He reports his initial back injury was some time before the year 2000. He states he has had some degree of pain everyday since. Today he reports an increase to his pain that made it difficult for him to even get into and out of his truck. He describes sharp increase to the pain with coughing or sneezing. He does not take anything for pain on a daily basis. He has been followed by [facility] [employer] and receives injections in his back, the last one 4 months ago.   The history is provided by the patient. No language interpreter was used.  Back Pain Associated symptoms: no abdominal pain, no fever, no numbness and no weakness       Past Medical History:  Diagnosis Date   Anxiety    Arthritis    shoulders   Coronary artery disease    Diabetes mellitus without complication (HCC)    H/O heart artery stent    x1   High cholesterol    Hypertension     Patient Active Problem List   Diagnosis Date Noted   Non-ST elevation (NSTEMI) myocardial infarction [facility])    Acute coronary syndrome ([place]) 12/22/2020   Tick bite 05/17/2020   Anxiety 02/05/2019   Essential hypertension 12/08/2017   Type 2 diabetes mellitus without complication, with long-term current use of insulin ([place]) 12/08/2017   Chest pain 10/04/2017   ASHD (arteriosclerotic heart disease) 11/15/2015   Hyperlipidemia 11/15/2015   Neuropathy 11/15/2015   Sprain/strain 11/27/2013    Past Surgical History:  Procedure Laterality Date   COLONOSCOPY     2012- normal    CORONARY ANGIOPLASTY WITH STENT PLACEMENT     CORONARY  STENT INTERVENTION N/A 12/23/2020   Procedure: CORONARY STENT INTERVENTION;  Surgeon: Sherren Mocha, MD;  Location: [facility];  Service: Cardiovascular;  Laterality: N/A;   LEFT HEART CATH AND CORONARY ANGIOGRAPHY N/A 12/23/2020   Procedure: LEFT HEART CATH AND CORONARY ANGIOGRAPHY;  Surgeon: Charolette Forward, MD;  Location: [facility];  Service: Cardiovascular;  Laterality: N/A;       Family History  Problem Relation Age of Onset   Hypertension Mother    Diabetes Father    Colon polyps Neg Hx    Colon cancer Neg Hx    Esophageal cancer Neg Hx    Rectal cancer Neg Hx    Stomach cancer Neg Hx     Social History   Tobacco Use   Smoking status: Former   Smokeless tobacco: Never  Substance Use Topics   Alcohol use: Yes    Comment: occ   Drug use: Not Currently    Home Medications Prior to Admission medications   Medication Sig Start Date End Date Taking? Authorizing Provider  acetaminophen (TYLENOL) 500 MG tablet Take 1 tablet (500 mg total) by mouth every 6 (six) hours as needed. Patient taking differently: Take 500 mg by mouth every 6 (six) hours as needed for mild pain. 12/08/17  Yes Dorena Dew, FNP  aspirin EC 81 MG tablet Take 81 mg by mouth daily.  Yes [provider]  atorvastatin (LIPITOR) 80 MG tablet Take 1 tablet (80 mg total) by mouth daily. 12/26/20  Yes Dixie Dials, MD  Blood Glucose Monitoring Suppl (TRUE METRIX AIR GLUCOSE METER) w/Device KIT 1 each by Does not apply route 3 (three) times daily. Patient taking differently: Check blood sugar every morning 10/27/20  Yes King, Diona Foley, NP  busPIRone (BUSPAR) 10 MG tablet Take 1 tablet (10 mg total) by mouth 2 (two) times daily. 10/27/20  Yes Vevelyn Francois, NP  clopidogrel (PLAVIX) 75 MG tablet Take 1 tablet (75 mg total) by mouth daily with breakfast. 12/26/20  Yes Dixie Dials, MD  cyclobenzaprine (FLEXERIL) 5 MG tablet Take 1 tablet (5 mg total) by mouth 3 (three) times daily as  needed for muscle spasms. 10/27/20  Yes Vevelyn Francois, NP  diclofenac Sodium (VOLTAREN) 1 % GEL Apply 2 g topically 4 (four) times daily. Patient taking differently: Apply 2 g topically 4 (four) times daily as needed (pain). 08/24/20  Yes Jessy Oto, MD  gabapentin (NEURONTIN) 300 MG capsule TAKE 1 CAPSULE(300 MG) BY MOUTH THREE TIMES DAILY Patient taking differently: Take 300 mg by mouth 3 (three) times daily as needed (pain). 05/06/20  Yes Azzie Glatter, FNP  glucose blood (TRUE METRIX BLOOD GLUCOSE TEST) test strip Use as instructed Patient taking differently: Check blood sugar every morning 10/27/20  Yes Vevelyn Francois, NP  ibuprofen (ADVIL) 200 MG tablet Take 200 mg by mouth every 6 (six) hours as needed for headache.   Yes [provider]  insulin glargine (LANTUS) 100 UNIT/ML Solostar Pen Inject 50 Units into the skin daily. Patient taking differently: Inject 50 Units into the skin at bedtime. 05/10/21  Yes King, Diona Foley, NP  Insulin Pen Needle (COMFORT TOUCH INSULIN PEN NEED) 31G X 6 MM MISC 1 pen by Does not apply route daily. 05/10/21  Yes Vevelyn Francois, NP  isosorbide mononitrate (IMDUR) 30 MG 24 hr tablet Take 30 mg by mouth every morning. 01/02/21  Yes [provider]  Lancets MISC 1 each 3 (three) times daily by Does not apply route. Patient taking differently: Check blood sugar every morning 09/19/17  Yes Cammie Sickle M, FNP  lisinopril (ZESTRIL) 20 MG tablet TAKE 1 TABLET(20 MG) BY MOUTH DAILY Patient taking differently: Take 20 mg by mouth daily. TAKE 1 TABLET(20 MG) BY MOUTH DAILY 10/27/20  Yes Vevelyn Francois, NP  losartan (COZAAR) 25 MG tablet Take 25 mg by mouth daily. 01/30/21  Yes [provider]  Menthol, Topical Analgesic, (BIOFREEZE) 4 % GEL Apply 1 application topically 4 (four) times daily as needed. Patient taking differently: Apply 1 application topically 4 (four) times daily as needed (shoulder pain). 05/17/20  Yes Azzie Glatter, FNP  metFORMIN (GLUCOPHAGE) 1000 MG tablet Take 1 tablet (1,000 mg total) by mouth 2 (two) times daily with a meal. Starting Monday. 12/25/20  Yes Dixie Dials, MD  methylPREDNISolone (MEDROL) 4 MG tablet TAKE 1 TABLET(4 MG) BY MOUTH DAILY FOR 14 DAYS THEN TAKE 1/2 TABLET(2 MG) BY MOUTH DAILY FOR 14 DAYS 05/10/21  Yes Jessy Oto, MD  nitroGLYCERIN (NITROSTAT) 0.4 MG SL tablet Place 1 tablet (0.4 mg total) under the tongue every 5 (five) minutes x 3 doses as needed for chest pain. 12/25/20  Yes Dixie Dials, MD  propranolol (INDERAL) 40 MG tablet Take 1 tablet (40 mg total) by mouth 2 (two) times daily. As needed for anxiety Patient taking differently: Take 40  mg by mouth 2 (two) times daily as needed (anxiety). As needed for anxiety 10/27/20  Yes Vevelyn Francois, NP  COVID-19 mRNA vaccine, Moderna, 100 MCG/0.5ML injection INJECT AS DIRECTED 12/02/20 12/02/21  Carlyle Basques, MD    Allergies    Tramadol  Review of Systems   Review of Systems  Constitutional:  Negative for chills and fever.  HENT: Negative.    Respiratory: Negative.    Cardiovascular: Negative.   Gastrointestinal:  Negative for abdominal pain, nausea and vomiting.  Genitourinary:        No incontinence  Musculoskeletal:  Positive for back pain.       See HPI.  Skin: Negative.   Neurological: Negative.  Negative for weakness and numbness.   Physical Exam Updated Vital Signs BP 129/88   Pulse (!) 57   Temp 97.7 F (36.5 C)   Resp 18   Ht 5' 9"  (1.753 m)   Wt 84 kg   SpO2 97%   BMI 27.35 kg/m   Physical Exam Vitals and nursing note reviewed.  Constitutional:      Appearance: He is well-developed.  HENT:     Head: Normocephalic.  Cardiovascular:     Rate and Rhythm: Normal rate and regular rhythm.     Heart sounds: No murmur heard. Pulmonary:     Effort: Pulmonary effort is normal.     Breath sounds: Normal breath sounds. No wheezing, rhonchi or rales.  Abdominal:     General: Bowel sounds are  normal.     Palpations: Abdomen is soft.     Tenderness: There is no abdominal tenderness. There is no guarding or rebound.  Musculoskeletal:        General: Normal range of motion.     Cervical back: Normal range of motion and neck supple.       Back:     Comments: Paralumbar tenderness bilaterally  Skin:    General: Skin is warm and dry.  Neurological:     General: No focal deficit present.     Mental Status: He is alert and oriented to person, place, and time.     Sensory: No sensory deficit.     Motor: No weakness.     Coordination: Coordination normal.     Deep Tendon Reflexes: Reflexes abnormal (reflexes are hyporeflexic equally in bilateral LE's).    ED Results / Procedures / Treatments   Labs (all labs ordered are listed, but only abnormal results are displayed) Labs Reviewed  URINALYSIS, ROUTINE W REFLEX MICROSCOPIC - Abnormal; Notable for the following components:      Result Value   Color, Urine STRAW (*)    Glucose, UA >=500 (*)    All other components within normal limits  CBC WITH DIFFERENTIAL/PLATELET - Abnormal; Notable for the following components:   WBC 3.4 (*)    Neutro Abs 1.4 (*)    All other components within normal limits  BASIC METABOLIC PANEL    EKG None  Radiology No results found.  Procedures Procedures   Medications Ordered in ED Medications  HYDROcodone-acetaminophen (NORCO/VICODIN) 5-325 MG per tablet 1 tablet (has no administration in time range)  ketorolac (TORADOL) 30 MG/ML injection 30 mg (has no administration in time range)  methocarbamol (ROBAXIN) tablet 750 mg (has no administration in time range)    ED Course  I have reviewed the triage vital signs and the nursing notes.  Pertinent labs & imaging results that were available during my care of the patient were reviewed by  me and considered in my medical decision making (see chart for details).    MDM Rules/Calculators/A&P                           Patient to ED with  acute on chronic back pain, suspected spasm on history and description of pain.   Chart reviewed. MRI done in February of this year showing lumbar spondylosis, dDD, moderate impingement of L2-3 and L4-5, mild impingement L3-4 and L5-S1. Do not feel repeat imaging tonight is indicated.   Per database, the patient receives no scheduled medications regularly. Will given IM toradol, PO norco and robaxin. Home with Norco and robaxin. Recommend follow up with his orthopedist or new referral to neurosurgery.  Final Clinical Impression(s) / ED Diagnoses Final diagnoses:  None   Acute on chronic low back pain  Rx / DC Orders ED Discharge Orders     None        Charlann Lange, PA-C 06/24/21 5520    Ripley Fraise, MD 06/24/21 [phone]

## 2021-06-24 NOTE — Discharge Instructions (Addendum)
Take the medications as prescribed. Use warm compresses for further relief. Rest the back completely for the next 5-6 days.   Follow up with your doctor for further pain management if needed. Make an appointment with neurosurgery for further evaluation of persistent low back pain.

## 2021-07-03 ENCOUNTER — Other Ambulatory Visit: Payer: Self-pay

## 2021-07-03 ENCOUNTER — Ambulatory Visit (INDEPENDENT_AMBULATORY_CARE_PROVIDER_SITE_OTHER): Payer: Commercial Managed Care - PPO | Admitting: Nurse Practitioner

## 2021-07-03 ENCOUNTER — Encounter: Payer: Self-pay | Admitting: Nurse Practitioner

## 2021-07-03 VITALS — BP 145/93 | HR 72 | Temp 98.1°F | Ht 69.0 in | Wt 157.0 lb

## 2021-07-03 DIAGNOSIS — M5441 Lumbago with sciatica, right side: Secondary | ICD-10-CM | POA: Diagnosis not present

## 2021-07-03 DIAGNOSIS — M5442 Lumbago with sciatica, left side: Secondary | ICD-10-CM | POA: Diagnosis not present

## 2021-07-03 DIAGNOSIS — G8929 Other chronic pain: Secondary | ICD-10-CM

## 2021-07-03 DIAGNOSIS — Z794 Long term (current) use of insulin: Secondary | ICD-10-CM | POA: Diagnosis not present

## 2021-07-03 DIAGNOSIS — E119 Type 2 diabetes mellitus without complications: Secondary | ICD-10-CM

## 2021-07-03 LAB — POCT GLYCOSYLATED HEMOGLOBIN (HGB A1C)
HbA1c POC (<> result, manual entry): 11.2 % (ref 4.0–5.6)
HbA1c, POC (controlled diabetic range): 11.2 % — AB (ref 0.0–7.0)
HbA1c, POC (prediabetic range): 11.2 % — AB (ref 5.7–6.4)
Hemoglobin A1C: 11.2 % — AB (ref 4.0–5.6)

## 2021-07-03 MED ORDER — LIDOCAINE 5 % EX PTCH
1.0000 | MEDICATED_PATCH | CUTANEOUS | 0 refills | Status: AC
Start: 2021-07-03 — End: 2021-07-10

## 2021-07-03 NOTE — Progress Notes (Signed)
[place], [place]  [address] Phone:  [phone]   Fax:  [phone] Subjective:   Patient ID: Dean Robertson, male    DOB: 1961/02/05, 60 y.o.   MRN: [id number]  Chief Complaint  Patient presents with   Back Pain    Low back pain radiates down leg, ED 8/19 Hydrocodone , robaxin and naproxen did not pick up    Back Pain Pertinent negatives include no abdominal pain, chest pain, fever or weakness.  Dean Robertson 60 y.o. male presents with   Back Pain: Patient presents for evaluation of low back problems.  Symptoms have been present for 6 month and include pain in low back that radiates to the lower extremities (pulling in character; 3/10 in severity). Describes pain in BLE as sharp and shooting. Initial inciting event: none. Symptoms are worst: nighttime. Alleviating factors identifiable by patient are Robaxin . Exacerbating factors identifiable by patient are any bending or prolonged standing. Treatments so far initiated by patient: OTC medications. Previous lower back problems: none. Previous workup:  was evaluated in the ED on 06/23/21 . Previous treatments:  Toradol and robaxin  . Patient discharged from ED with prescription for Norco and robaxin, has not picked up prescription due to time constraints.   Past Medical History:  Diagnosis Date   Anxiety    Arthritis    shoulders   Coronary artery disease    Diabetes mellitus without complication ([place])    H/O heart artery stent    x1   High cholesterol    Hypertension     Past Surgical History:  Procedure Laterality Date   COLONOSCOPY     2012- normal    CORONARY ANGIOPLASTY WITH STENT PLACEMENT     CORONARY STENT INTERVENTION N/A 12/23/2020   Procedure: CORONARY STENT INTERVENTION;  Surgeon: Sherren Mocha, MD;  Location: [facility];  Service: Cardiovascular;  Laterality: N/A;   LEFT HEART CATH AND CORONARY ANGIOGRAPHY N/A 12/23/2020   Procedure: LEFT HEART CATH AND  CORONARY ANGIOGRAPHY;  Surgeon: Charolette Forward, MD;  Location: [facility];  Service: Cardiovascular;  Laterality: N/A;    Family History  Problem Relation Age of Onset   Hypertension Mother    Diabetes Father    Colon polyps Neg Hx    Colon cancer Neg Hx    Esophageal cancer Neg Hx    Rectal cancer Neg Hx    Stomach cancer Neg Hx     Social History   Socioeconomic History   Marital status: Legally Separated    Spouse name: Not on file   Number of children: Not on file   Years of education: Not on file   Highest education level: Not on file  Occupational History   Not on file  Tobacco Use   Smoking status: Former   Smokeless tobacco: Never  Substance and Sexual Activity   Alcohol use: Yes    Comment: occ   Drug use: Not Currently   Sexual activity: Not on file  Other Topics Concern   Not on file  Social History Narrative   Not on file   Social Determinants of Health   Financial Resource Strain: Not on file  Food Insecurity: Not on file  Transportation Needs: Not on file  Physical Activity: Not on file  Stress: Not on file  Social Connections: Not on file  Intimate Partner Violence: Not on file    Outpatient Medications Prior to Visit  Medication Sig  Dispense Refill   acetaminophen (TYLENOL) 500 MG tablet Take 1 tablet (500 mg total) by mouth every 6 (six) hours as needed. (Patient taking differently: Take 500 mg by mouth every 6 (six) hours as needed for mild pain.) 30 tablet 0   aspirin EC 81 MG tablet Take 81 mg by mouth daily.     atorvastatin (LIPITOR) 80 MG tablet Take 1 tablet (80 mg total) by mouth daily. 30 tablet 3   Blood Glucose Monitoring Suppl (TRUE METRIX AIR GLUCOSE METER) w/Device KIT 1 each by Does not apply route 3 (three) times daily. (Patient taking differently: Check blood sugar every morning) 1 kit 0   busPIRone (BUSPAR) 10 MG tablet Take 1 tablet (10 mg total) by mouth 2 (two) times daily. 60 tablet 3   clopidogrel (PLAVIX) 75 MG  tablet Take 1 tablet (75 mg total) by mouth daily with breakfast. 75 tablet 3   cyclobenzaprine (FLEXERIL) 5 MG tablet Take 1 tablet (5 mg total) by mouth 3 (three) times daily as needed for muscle spasms. 30 tablet 1   diclofenac Sodium (VOLTAREN) 1 % GEL Apply 2 g topically 4 (four) times daily. (Patient taking differently: Apply 2 g topically 4 (four) times daily as needed (pain).) 350 g 3   gabapentin (NEURONTIN) 300 MG capsule TAKE 1 CAPSULE(300 MG) BY MOUTH THREE TIMES DAILY (Patient taking differently: Take 300 mg by mouth 3 (three) times daily as needed (pain).) 270 capsule 3   glucose blood (TRUE METRIX BLOOD GLUCOSE TEST) test strip Use as instructed (Patient taking differently: Check blood sugar every morning) 100 each 12   ibuprofen (ADVIL) 200 MG tablet Take 200 mg by mouth every 6 (six) hours as needed for headache.     insulin glargine (LANTUS) 100 UNIT/ML Solostar Pen Inject 50 Units into the skin daily. (Patient taking differently: Inject 50 Units into the skin at bedtime.) 15 mL 11   Insulin Pen Needle (COMFORT TOUCH INSULIN PEN NEED) 31G X 6 MM MISC 1 pen by Does not apply route daily. 100 each 5   isosorbide mononitrate (IMDUR) 30 MG 24 hr tablet Take 30 mg by mouth every morning.     Lancets MISC 1 each 3 (three) times daily by Does not apply route. (Patient taking differently: Check blood sugar every morning) 100 each 11   lisinopril (ZESTRIL) 20 MG tablet TAKE 1 TABLET(20 MG) BY MOUTH DAILY (Patient taking differently: Take 20 mg by mouth daily. TAKE 1 TABLET(20 MG) BY MOUTH DAILY) 90 tablet 3   losartan (COZAAR) 25 MG tablet Take 25 mg by mouth daily.     Menthol, Topical Analgesic, (BIOFREEZE) 4 % GEL Apply 1 application topically 4 (four) times daily as needed. (Patient taking differently: Apply 1 application topically 4 (four) times daily as needed (shoulder pain).) 89 mL 3   metFORMIN (GLUCOPHAGE) 1000 MG tablet Take 1 tablet (1,000 mg total) by mouth 2 (two) times daily with  a meal. Starting Monday. 180 tablet 3   nitroGLYCERIN (NITROSTAT) 0.4 MG SL tablet Place 1 tablet (0.4 mg total) under the tongue every 5 (five) minutes x 3 doses as needed for chest pain. 25 tablet 1   propranolol (INDERAL) 40 MG tablet Take 1 tablet (40 mg total) by mouth 2 (two) times daily. As needed for anxiety (Patient taking differently: Take 40 mg by mouth 2 (two) times daily as needed (anxiety). As needed for anxiety) 60 tablet 6   HYDROcodone-acetaminophen (NORCO/VICODIN) 5-325 MG tablet Take 1-2 tablets by mouth  every 6 (six) hours as needed. (Patient not taking: Reported on 07/03/2021) 12 tablet 0   methocarbamol (ROBAXIN) 750 MG tablet Take 1 tablet (750 mg total) by mouth 2 (two) times daily. (Patient not taking: Reported on 07/03/2021) 20 tablet 0   naproxen (NAPROSYN) 500 MG tablet Take 1 tablet (500 mg total) by mouth 2 (two) times daily. (Patient not taking: Reported on 07/03/2021) 30 tablet 0   ondansetron (ZOFRAN ODT) 4 MG disintegrating tablet Take 1 tablet (4 mg total) by mouth every 8 (eight) hours as needed for nausea or vomiting. (Patient not taking: Reported on 07/03/2021) 10 tablet 0   COVID-19 mRNA vaccine, Moderna, 100 MCG/0.5ML injection INJECT AS DIRECTED .25 mL 0   methylPREDNISolone (MEDROL) 4 MG tablet TAKE 1 TABLET(4 MG) BY MOUTH DAILY FOR 14 DAYS THEN TAKE 1/2 TABLET(2 MG) BY MOUTH DAILY FOR 14 DAYS 21 tablet 0   No facility-administered medications prior to visit.    Allergies  Allergen Reactions   Tramadol Nausea And Vomiting    Review of Systems  Constitutional:  Negative for chills, fever and malaise/fatigue.  Respiratory:  Negative for cough and shortness of breath.   Cardiovascular:  Negative for chest pain, palpitations and leg swelling.  Gastrointestinal:  Negative for abdominal pain, blood in stool, constipation, diarrhea, nausea and vomiting.  Musculoskeletal:  Positive for back pain.  Skin: Negative.   Neurological:  Negative for tremors, sensory  change, focal weakness and weakness.  Psychiatric/Behavioral:  Negative for depression. The patient is not nervous/anxious.   All other systems reviewed and are negative.     Objective:    Physical Exam Vitals reviewed.  Constitutional:      General: He is not in acute distress.    Appearance: Normal appearance.  HENT:     Head: Normocephalic.  Cardiovascular:     Rate and Rhythm: Normal rate and regular rhythm.     Pulses: Normal pulses.     Heart sounds: Normal heart sounds.     Comments: No obvious peripheral edema Pulmonary:     Effort: Pulmonary effort is normal.     Breath sounds: Normal breath sounds.  Musculoskeletal:        General: Normal range of motion.     Comments: Mild tenderness with palpation of the lower mid lumbar area.   Skin:    General: Skin is warm and dry.     Capillary Refill: Capillary refill takes less than 2 seconds.  Neurological:     General: No focal deficit present.     Mental Status: He is alert and oriented to person, place, and time.  Psychiatric:        Mood and Affect: Mood normal.        Behavior: Behavior normal.        Thought Content: Thought content normal.        Judgment: Judgment normal.    BP (!) 145/93   Pulse 72   Temp 98.1 F (36.7 C)   Ht 5' 9"  (1.753 m)   Wt 157 lb (71.2 kg)   SpO2 100%   BMI 23.18 kg/m  Wt Readings from Last 3 Encounters:  07/03/21 157 lb (71.2 kg)  06/23/21 185 lb 3 oz (84 kg)  01/25/21 167 lb (75.8 kg)    Immunization History  Administered Date(s) Administered   Influenza,inj,Quad PF,6+ Mos 09/19/2017, 11/06/2018, 10/27/2020   Moderna SARS-COV2 Booster Vaccination 12/02/2020   Moderna Sars-Covid-2 Vaccination 02/22/2020   PFIZER(Purple Top)SARS-COV-2 Vaccination 03/21/2020  Pneumococcal Polysaccharide-23 09/19/2017   Tdap 07/29/2008, 06/20/2016, 05/14/2017, 06/26/2019    Diabetic Foot Exam - Simple   No data filed     Lab Results  Component Value Date   TSH 0.605 05/06/2020    Lab Results  Component Value Date   WBC 3.4 (L) 06/24/2021   HGB 13.0 06/24/2021   HCT 39.7 06/24/2021   MCV 91.3 06/24/2021   PLT 268 06/24/2021   Lab Results  Component Value Date   NA 134 (L) 06/24/2021   K 4.3 06/24/2021   CO2 23 06/24/2021   GLUCOSE 351 (H) 06/24/2021   BUN 24 (H) 06/24/2021   CREATININE 1.07 06/24/2021   BILITOT <0.2 12/19/2020   ALKPHOS 78 12/19/2020   AST 15 12/19/2020   ALT 28 09/26/2020   PROT 6.8 12/19/2020   ALBUMIN 4.4 12/19/2020   CALCIUM 8.9 06/24/2021   ANIONGAP 10 06/24/2021   Lab Results  Component Value Date   CHOL 217 (H) 12/23/2020   CHOL 231 (H) 12/19/2020   CHOL 220 (H) 05/06/2020   Lab Results  Component Value Date   HDL 57 12/23/2020   HDL 54 12/19/2020   HDL 82 05/06/2020   Lab Results  Component Value Date   LDLCALC 106 (H) 12/23/2020   LDLCALC 131 (H) 12/19/2020   LDLCALC 123 (H) 05/06/2020   Lab Results  Component Value Date   TRIG 270 (H) 12/23/2020   TRIG 259 (H) 12/19/2020   TRIG 86 05/06/2020   Lab Results  Component Value Date   CHOLHDL 3.8 12/23/2020   CHOLHDL 4.3 12/19/2020   CHOLHDL 2.7 05/06/2020   Lab Results  Component Value Date   HGBA1C 11.2 (A) 07/03/2021   HGBA1C 11.2 07/03/2021   HGBA1C 11.2 (A) 07/03/2021   HGBA1C 11.2 (A) 07/03/2021       Assessment & Plan:   Problem List Items Addressed This Visit       Endocrine   Type 2 diabetes mellitus without complication, with long-term current use of insulin ([place]) - Primary   Relevant Orders   HgB A1c (Completed)   Other Visit Diagnoses     Chronic bilateral low back pain with bilateral sciatica       Relevant Medications   lidocaine (LIDODERM) 5 % Patient informed to begin taking previously prescribed medications for back pain    Other Relevant Orders   Ambulatory referral to Orthopedic Surgery   Follow up in 4 wks for reevaluation of chronic illness    I have discontinued Deidre Ala Wyndham's COVID-19 mRNA vaccine (Moderna)  and methylPREDNISolone. I am also having him start on lidocaine. Additionally, I am having him maintain his aspirin EC, Lancets, acetaminophen, gabapentin, Biofreeze, diclofenac Sodium, True Metrix Air Glucose Meter, True Metrix Blood Glucose Test, propranolol, lisinopril, busPIRone, cyclobenzaprine, atorvastatin, nitroGLYCERIN, metFORMIN, clopidogrel, isosorbide mononitrate, losartan, insulin glargine, Comfort Touch Insulin Pen Need, ibuprofen, methocarbamol, HYDROcodone-acetaminophen, naproxen, and ondansetron.  Meds ordered this encounter  Medications   lidocaine (LIDODERM) 5 %    Sig: Place 1 patch onto the skin daily for 7 days. Remove & Discard patch within 12 hours or as directed by MD    Dispense:  7 patch    Refill:  0     Teena Dunk, NP

## 2021-07-03 NOTE — Patient Instructions (Signed)
You were seen in the [facility] today for your acute on chronic low back pain. You were prescribed lidocaine patches, please take this in addition to previously prescribed medications from the ED. Please maintain follow up with orthopedics.

## 2021-07-26 ENCOUNTER — Encounter: Payer: Self-pay | Admitting: Specialist

## 2021-07-26 ENCOUNTER — Ambulatory Visit (INDEPENDENT_AMBULATORY_CARE_PROVIDER_SITE_OTHER): Payer: Commercial Managed Care - PPO | Admitting: Specialist

## 2021-07-26 ENCOUNTER — Ambulatory Visit: Payer: Self-pay

## 2021-07-26 ENCOUNTER — Other Ambulatory Visit: Payer: Self-pay

## 2021-07-26 VITALS — BP 134/88 | HR 67 | Ht 69.0 in | Wt 157.0 lb

## 2021-07-26 DIAGNOSIS — G8929 Other chronic pain: Secondary | ICD-10-CM

## 2021-07-26 DIAGNOSIS — M5137 Other intervertebral disc degeneration, lumbosacral region: Secondary | ICD-10-CM

## 2021-07-26 DIAGNOSIS — M545 Low back pain, unspecified: Secondary | ICD-10-CM | POA: Diagnosis not present

## 2021-07-26 DIAGNOSIS — M48061 Spinal stenosis, lumbar region without neurogenic claudication: Secondary | ICD-10-CM

## 2021-07-26 DIAGNOSIS — M5416 Radiculopathy, lumbar region: Secondary | ICD-10-CM | POA: Diagnosis not present

## 2021-07-26 DIAGNOSIS — M47816 Spondylosis without myelopathy or radiculopathy, lumbar region: Secondary | ICD-10-CM | POA: Diagnosis not present

## 2021-07-26 NOTE — Patient Instructions (Signed)
Plan: Avoid bending, stooping and avoid lifting weights greater than 10 lbs. Avoid prolong standing and walking. Avoid frequent bending and stooping  No lifting greater than 10 lbs. May use ice or moist heat for pain. Weight loss is of benefit. Handicap license is approved. Exercising on stationary bicycle or pool walking Presently you are able to walk a mile and able to grocery shop, you function on a high level so surgery may not be of Benefit as most patients see improvement in walking and it would be difficult to tell if surgery really helped.  Eventually you may need a surgical procedure to relieve nerve pressure.  Referral to Dr. Ernestina Patches for joint injections to determine iif RFA may be of benefit.

## 2021-07-26 NOTE — Progress Notes (Signed)
Office Visit Note   Patient: Dean Robertson           Date of Birth: 1960/11/21           MRN: [id number] Visit Date: 07/26/2021              Requested by: Bo Merino I, NP [place] [address] [place],  [place] [address] PCP: Vevelyn Francois, NP   Assessment & Plan: Visit Diagnoses:  1. Chronic bilateral low back pain, unspecified whether sciatica present   2. Disc disease, degenerative, lumbar or lumbosacral   3. Spondylosis without myelopathy or radiculopathy, lumbar region   4. Radiculopathy, lumbar region   5. Spinal stenosis of lumbar region without neurogenic claudication   60 year old male with long history of back pain and disability related to his back. No focal motor deficit and able to  Walk a mile. His MRI studies suggest moderate subarticular lateral recess stenosis at L4-5 and L5-S1 and a small central and left  Disc protrusion at L2-3. There are no neural tension signs. Previous ESIs would help for a week or two. May be helped with facet Blocks and consideratin of RFA of the lowest 2 lumbar segment. I believe his pain is partly stenosis but his high functional level  Suggests surgery may not afford him much functional improvement.  Plan: Avoid bending, stooping and avoid lifting weights greater than 10 lbs. Avoid prolong standing and walking. Avoid frequent bending and stooping  No lifting greater than 10 lbs. May use ice or moist heat for pain. Weight loss is of benefit. Handicap license is approved. Exercising on stationary bicycle or pool walking Presently you are able to walk a mile and able to grocery shop, you function on a high level so surgery may not be of Benefit as most patients see improvement in walking and it would be difficult to tell if surgery really helped.  Eventually you may need a surgical procedure to relieve nerve pressure.  Referral to Dr. Ernestina Patches for joint injections to determine iif RFA may be of benefit.  Follow-Up Instructions: Return  in about 4 weeks (around 08/23/2021).   Orders:  Orders Placed This Encounter  Procedures   XR Lumbar Spine 2-3 Views   Ambulatory referral to Physical Medicine Rehab   No orders of the defined types were placed in this encounter.     Procedures: No procedures performed   Clinical Data: No additional findings.   Subjective: Chief Complaint  Patient presents with   Lower Back - Pain    60 year old male with a couple year history of back pain that comes and goes. He is dsabled due to his back. No bladder or bowel difficulty.Worked previously at  Some pain with cough or sneeze. Cough hurts so pain that he has to bend over. In the past he has seen me and Dr. Junius Roads, Dr. Ernestina Patches and Benjiman Core and Dr. Lorin Mercy. He reports he can walk a mile and is able to grocery shop and does tend to lean on grocery cart when shopping. Has been seen at the [facility] in the past and evaluated. MRI in the past has demonstrated areas of lateral recess stenosis at L4-5 and L5-S1 with a left disc protrusion that is mild at the L2-3 level central and left sided. His pain is central to the lumbar and lumbosacral junction posteiorly and radiates to the buttocks and posterior thighs.    Review of Systems  Constitutional: Negative.  HENT: Negative.    Eyes: Negative.   Respiratory: Negative.    Cardiovascular: Negative.   Gastrointestinal: Negative.   Endocrine: Negative.   Genitourinary: Negative.   Musculoskeletal:  Positive for back pain and neck stiffness. Negative for arthralgias and gait problem.  Skin: Negative.  Negative for color change, pallor, rash and wound.  Allergic/Immunologic: Negative.   Neurological: Negative.  Negative for weakness and numbness.  Hematological: Negative.   Psychiatric/Behavioral: Negative.  Negative for self-injury and sleep disturbance. The patient is not nervous/anxious.     Objective: Vital Signs: BP 134/88 (BP Location: Left Arm, Patient Position:  Sitting)   Pulse 67   Ht 5\' 9"  (1.753 m)   Wt 157 lb (71.2 kg)   BMI 23.18 kg/m   Physical Exam Constitutional:      Appearance: He is well-developed.  HENT:     Head: Normocephalic and atraumatic.  Eyes:     Pupils: Pupils are equal, round, and reactive to light.  Pulmonary:     Effort: Pulmonary effort is normal.     Breath sounds: Normal breath sounds.  Abdominal:     General: Bowel sounds are normal.     Palpations: Abdomen is soft.  Musculoskeletal:     Cervical back: Normal range of motion and neck supple.     Lumbar back: Negative right straight leg raise test and negative left straight leg raise test.  Skin:    General: Skin is warm and dry.  Neurological:     Mental Status: He is alert and oriented to person, place, and time.  Psychiatric:        Behavior: Behavior normal.        Thought Content: Thought content normal.        Judgment: Judgment normal.    Back Exam   Tenderness  The patient is experiencing tenderness in the lumbar.  Range of Motion  Extension:  abnormal  Flexion:  normal  Lateral bend right:  abnormal  Lateral bend left:  abnormal  Rotation right:  abnormal  Rotation left:  abnormal   Muscle Strength  Right Quadriceps:  5/5  Left Quadriceps:  5/5  Right Hamstrings:  5/5  Left Hamstrings:  5/5   Tests  Straight leg raise right: negative Straight leg raise left: negative  Reflexes  Patellar:  1/4 Achilles:  1/4  Other  Toe walk: normal Heel walk: normal Sensation: normal Gait: normal   Comments:  Well dressed, no focal motor deficits, he can bend and touch his toes and shoes, pushes off thighs to regain upright position.  Pain seems to be more back than leg central to the Lumbosacral junction.      Specialty Comments:  No specialty comments available.  Imaging: No results found.   PMFS History: Patient Active Problem List   Diagnosis Date Noted   Non-ST elevation (NSTEMI) myocardial infarction [facility])    Acute  coronary syndrome ([place]) 12/22/2020   Tick bite 05/17/2020   Anxiety 02/05/2019   Essential hypertension 12/08/2017   Type 2 diabetes mellitus without complication, with long-term current use of insulin ([place]) 12/08/2017   Chest pain 10/04/2017   ASHD (arteriosclerotic heart disease) 11/15/2015   Hyperlipidemia 11/15/2015   Neuropathy 11/15/2015   Sprain/strain 11/27/2013   Past Medical History:  Diagnosis Date   Anxiety    Arthritis    shoulders   Coronary artery disease    Diabetes mellitus without complication ([place])    H/O heart artery stent    x1  High cholesterol    Hypertension     Family History  Problem Relation Age of Onset   Hypertension Mother    Diabetes Father    Colon polyps Neg Hx    Colon cancer Neg Hx    Esophageal cancer Neg Hx    Rectal cancer Neg Hx    Stomach cancer Neg Hx     Past Surgical History:  Procedure Laterality Date   COLONOSCOPY     2012- normal    CORONARY ANGIOPLASTY WITH STENT PLACEMENT     CORONARY STENT INTERVENTION N/A 12/23/2020   Procedure: CORONARY STENT INTERVENTION;  Surgeon: Sherren Mocha, MD;  Location: [facility];  Service: Cardiovascular;  Laterality: N/A;   LEFT HEART CATH AND CORONARY ANGIOGRAPHY N/A 12/23/2020   Procedure: LEFT HEART CATH AND CORONARY ANGIOGRAPHY;  Surgeon: Charolette Forward, MD;  Location: [facility];  Service: Cardiovascular;  Laterality: N/A;   Social History   Occupational History   Not on file  Tobacco Use   Smoking status: Former   Smokeless tobacco: Never  Substance and Sexual Activity   Alcohol use: Yes    Comment: occ   Drug use: Not Currently   Sexual activity: Not on file

## 2021-07-31 ENCOUNTER — Ambulatory Visit: Payer: Commercial Managed Care - PPO | Admitting: Physical Medicine and Rehabilitation

## 2021-08-14 ENCOUNTER — Encounter: Payer: Self-pay | Admitting: Nurse Practitioner

## 2021-08-14 ENCOUNTER — Ambulatory Visit (INDEPENDENT_AMBULATORY_CARE_PROVIDER_SITE_OTHER): Payer: Commercial Managed Care - PPO | Admitting: Nurse Practitioner

## 2021-08-14 ENCOUNTER — Other Ambulatory Visit: Payer: Self-pay

## 2021-08-14 VITALS — BP 133/84 | HR 71 | Temp 98.1°F | Ht 69.0 in | Wt 160.0 lb

## 2021-08-14 DIAGNOSIS — Z794 Long term (current) use of insulin: Secondary | ICD-10-CM | POA: Diagnosis not present

## 2021-08-14 DIAGNOSIS — E7849 Other hyperlipidemia: Secondary | ICD-10-CM | POA: Diagnosis not present

## 2021-08-14 DIAGNOSIS — Z23 Encounter for immunization: Secondary | ICD-10-CM

## 2021-08-14 DIAGNOSIS — E119 Type 2 diabetes mellitus without complications: Secondary | ICD-10-CM

## 2021-08-14 DIAGNOSIS — I10 Essential (primary) hypertension: Secondary | ICD-10-CM

## 2021-08-14 LAB — POCT GLYCOSYLATED HEMOGLOBIN (HGB A1C)
HbA1c POC (<> result, manual entry): 11.4 % (ref 4.0–5.6)
HbA1c, POC (controlled diabetic range): 11.4 % — AB (ref 0.0–7.0)
HbA1c, POC (prediabetic range): 11.4 % — AB (ref 5.7–6.4)
Hemoglobin A1C: 11.4 % — AB (ref 4.0–5.6)

## 2021-08-14 MED ORDER — GLIPIZIDE 5 MG PO TABS: 5.0000 mg | ORAL_TABLET | Freq: Two times a day (BID) | ORAL | 3 refills | Status: DC

## 2021-08-14 NOTE — Progress Notes (Signed)
[place],   [address] Phone:  [phone]   Fax:  [phone]   Established Patient Office Visit  Subjective:  Patient ID: Dean Robertson, male    DOB: 02/23/1961  Age: 60 y.o. MRN: [id number]  CC:  Chief Complaint  Patient presents with   Diabetes    4 week follow up;Diabetes Pt has been having lower back pains and is getting steroid injection in his back but they only last for about 2 to 3 weeks and then his is back in pain.     HPI Ossiel Robertson presents for follow up. He  has a past medical history of Anxiety, Arthritis, Coronary artery disease, Diabetes mellitus without complication ([place]), H/O heart artery stent, High cholesterol, and Hypertension.   He is following up for his diabetes. He reports CBG 87-499. He reports going to the ED on one occasion with the CBG of > 300. He denies any tx changes and reports CBG after the ED visit was 310. He is injecting Lantus 30 units. He is prescribed Lantus 50 units. He has been on this regimen for 2 years. No reason for not taking as directed. He also takes metformin. He is unsure about any additional oral antihyperglycemic medication.   Denies headache, dizziness, visual changes, shortness of breath, dyspnea on exertion, chest pain, nausea, vomiting or any edema. He has itching in his hands and feet.    Past Medical History:  Diagnosis Date   Anxiety    Arthritis    shoulders   Coronary artery disease    Diabetes mellitus without complication ([place])    H/O heart artery stent    x1   High cholesterol    Hypertension     Past Surgical History:  Procedure Laterality Date   COLONOSCOPY     2012- normal    CORONARY ANGIOPLASTY WITH STENT PLACEMENT     CORONARY STENT INTERVENTION N/A 12/23/2020   Procedure: CORONARY STENT INTERVENTION;  Surgeon: Sherren Mocha, MD;  Location: [facility];  Service: Cardiovascular;  Laterality: N/A;   LEFT HEART CATH AND CORONARY  ANGIOGRAPHY N/A 12/23/2020   Procedure: LEFT HEART CATH AND CORONARY ANGIOGRAPHY;  Surgeon: Charolette Forward, MD;  Location: [facility];  Service: Cardiovascular;  Laterality: N/A;    Family History  Problem Relation Age of Onset   Hypertension Mother    Diabetes Father    Colon polyps Neg Hx    Colon cancer Neg Hx    Esophageal cancer Neg Hx    Rectal cancer Neg Hx    Stomach cancer Neg Hx     Social History   Socioeconomic History   Marital status: Legally Separated    Spouse name: Not on file   Number of children: Not on file   Years of education: Not on file   Highest education level: Not on file  Occupational History   Not on file  Tobacco Use   Smoking status: Former   Smokeless tobacco: Never  Vaping Use   Vaping Use: Never used  Substance and Sexual Activity   Alcohol use: Yes    Comment: occ   Drug use: Not Currently   Sexual activity: Not Currently    Birth control/protection: None  Other Topics Concern   Not on file  Social History Narrative   Not on file   Social Determinants of Health   Financial Resource Strain: Not on file  Food Insecurity: Not on file  Transportation Needs: Not on file  Physical Activity: Not on file  Stress: Not on file  Social Connections: Not on file  Intimate Partner Violence: Not on file    Outpatient Medications Prior to Visit  Medication Sig Dispense Refill   acetaminophen (TYLENOL) 500 MG tablet Take 1 tablet (500 mg total) by mouth every 6 (six) hours as needed. (Patient taking differently: Take 500 mg by mouth every 6 (six) hours as needed for mild pain.) 30 tablet 0   aspirin EC 81 MG tablet Take 81 mg by mouth daily.     atorvastatin (LIPITOR) 80 MG tablet Take 1 tablet (80 mg total) by mouth daily. 30 tablet 3   Blood Glucose Monitoring Suppl (TRUE METRIX AIR GLUCOSE METER) w/Device KIT 1 each by Does not apply route 3 (three) times daily. (Patient taking differently: Check blood sugar every morning) 1 kit 0    busPIRone (BUSPAR) 10 MG tablet Take 1 tablet (10 mg total) by mouth 2 (two) times daily. 60 tablet 3   clopidogrel (PLAVIX) 75 MG tablet Take 1 tablet (75 mg total) by mouth daily with breakfast. 75 tablet 3   cyclobenzaprine (FLEXERIL) 5 MG tablet Take 1 tablet (5 mg total) by mouth 3 (three) times daily as needed for muscle spasms. 30 tablet 1   diclofenac Sodium (VOLTAREN) 1 % GEL Apply 2 g topically 4 (four) times daily. (Patient taking differently: Apply 2 g topically 4 (four) times daily as needed (pain).) 350 g 3   gabapentin (NEURONTIN) 300 MG capsule TAKE 1 CAPSULE(300 MG) BY MOUTH THREE TIMES DAILY (Patient taking differently: Take 300 mg by mouth 3 (three) times daily as needed (pain).) 270 capsule 3   glucose blood (TRUE METRIX BLOOD GLUCOSE TEST) test strip Use as instructed (Patient taking differently: Check blood sugar every morning) 100 each 12   ibuprofen (ADVIL) 200 MG tablet Take 200 mg by mouth every 6 (six) hours as needed for headache.     insulin glargine (LANTUS) 100 UNIT/ML Solostar Pen Inject 50 Units into the skin daily. (Patient taking differently: Inject 50 Units into the skin at bedtime.) 15 mL 11   Insulin Pen Needle (COMFORT TOUCH INSULIN PEN NEED) 31G X 6 MM MISC 1 pen by Does not apply route daily. 100 each 5   isosorbide mononitrate (IMDUR) 30 MG 24 hr tablet Take 30 mg by mouth every morning.     ketorolac (TORADOL) 10 MG tablet Take 10 mg by mouth every 6 (six) hours as needed.     Lancets MISC 1 each 3 (three) times daily by Does not apply route. (Patient taking differently: Check blood sugar every morning) 100 each 11   lisinopril (ZESTRIL) 20 MG tablet TAKE 1 TABLET(20 MG) BY MOUTH DAILY (Patient taking differently: Take 20 mg by mouth daily. TAKE 1 TABLET(20 MG) BY MOUTH DAILY) 90 tablet 3   Menthol, Topical Analgesic, (BIOFREEZE) 4 % GEL Apply 1 application topically 4 (four) times daily as needed. (Patient taking differently: Apply 1 application topically 4  (four) times daily as needed (shoulder pain).) 89 mL 3   metFORMIN (GLUCOPHAGE) 1000 MG tablet Take 1 tablet (1,000 mg total) by mouth 2 (two) times daily with a meal. Starting Monday. 180 tablet 3   nitroGLYCERIN (NITROSTAT) 0.4 MG SL tablet Place 1 tablet (0.4 mg total) under the tongue every 5 (five) minutes x 3 doses as needed for chest pain. 25 tablet 1   propranolol (INDERAL) 40 MG tablet Take 1 tablet (40 mg  total) by mouth 2 (two) times daily. As needed for anxiety (Patient taking differently: Take 40 mg by mouth 2 (two) times daily as needed (anxiety). As needed for anxiety) 60 tablet 6   diazepam (VALIUM) 5 MG tablet Take 5 mg by mouth 2 (two) times daily as needed.     losartan (COZAAR) 25 MG tablet Take 25 mg by mouth daily.     HYDROcodone-acetaminophen (NORCO/VICODIN) 5-325 MG tablet Take 1-2 tablets by mouth every 6 (six) hours as needed. (Patient not taking: No sig reported) 12 tablet 0   methocarbamol (ROBAXIN) 750 MG tablet Take 1 tablet (750 mg total) by mouth 2 (two) times daily. (Patient not taking: No sig reported) 20 tablet 0   ondansetron (ZOFRAN ODT) 4 MG disintegrating tablet Take 1 tablet (4 mg total) by mouth every 8 (eight) hours as needed for nausea or vomiting. (Patient not taking: No sig reported) 10 tablet 0   No facility-administered medications prior to visit.    Allergies  Allergen Reactions   Tramadol Nausea And Vomiting    ROS Review of Systems    Objective:    Physical Exam Constitutional:      Appearance: He is normal weight.  HENT:     Head: Normocephalic.  Cardiovascular:     Rate and Rhythm: Normal rate and regular rhythm.     Heart sounds: Murmur heard.  Pulmonary:     Effort: Pulmonary effort is normal.     Breath sounds: Normal breath sounds.  Genitourinary:    Prostate: Normal.     Rectum: Normal. Guaiac result negative.  Musculoskeletal:     Cervical back: Normal range of motion.  Skin:    General: Skin is warm.     Capillary  Refill: Capillary refill takes less than 2 seconds.  Neurological:     General: No focal deficit present.     Mental Status: He is alert and oriented to person, place, and time.  Psychiatric:        Mood and Affect: Mood normal.        Behavior: Behavior normal.        Thought Content: Thought content normal.        Judgment: Judgment normal.    BP 133/84   Pulse 71   Temp 98.1 F (36.7 C)   Ht 5' 9"  (1.753 m)   Wt 160 lb (72.6 kg)   SpO2 99%   BMI 23.63 kg/m  Wt Readings from Last 3 Encounters:  08/14/21 160 lb (72.6 kg)  07/26/21 157 lb (71.2 kg)  07/03/21 157 lb (71.2 kg)     There are no preventive care reminders to display for this patient.   There are no preventive care reminders to display for this patient.  Lab Results  Component Value Date   TSH 0.605 05/06/2020   Lab Results  Component Value Date   WBC 3.4 (L) 06/24/2021   HGB 13.0 06/24/2021   HCT 39.7 06/24/2021   MCV 91.3 06/24/2021   PLT 268 06/24/2021   Lab Results  Component Value Date   NA 134 (L) 06/24/2021   K 4.3 06/24/2021   CO2 23 06/24/2021   GLUCOSE 351 (H) 06/24/2021   BUN 24 (H) 06/24/2021   CREATININE 1.07 06/24/2021   BILITOT <0.2 12/19/2020   ALKPHOS 78 12/19/2020   AST 15 12/19/2020   ALT 28 09/26/2020   PROT 6.8 12/19/2020   ALBUMIN 4.4 12/19/2020   CALCIUM 8.9 06/24/2021   ANIONGAP 10 06/24/2021  Lab Results  Component Value Date   CHOL 217 (H) 12/23/2020   Lab Results  Component Value Date   HDL 57 12/23/2020   Lab Results  Component Value Date   LDLCALC 106 (H) 12/23/2020   Lab Results  Component Value Date   TRIG 270 (H) 12/23/2020   Lab Results  Component Value Date   CHOLHDL 3.8 12/23/2020   Lab Results  Component Value Date   HGBA1C 11.4 (A) 08/14/2021   HGBA1C 11.4 08/14/2021   HGBA1C 11.4 (A) 08/14/2021   HGBA1C 11.4 (A) 08/14/2021      Assessment & Plan:   Problem List Items Addressed This Visit       Cardiovascular and Mediastinum    Essential hypertension Encouraged on going compliance with current medication regimen Encouraged home monitoring and recording BP <130/80 Eating a heart-healthy diet with less salt Encouraged regular physical activity       Endocrine   Type 2 diabetes mellitus without complication, with long-term current use of insulin (HCC) - Primary Uncontrolled Encourage compliance with current treatment regimen  Dose adjustment added glipizide 5 mg BID Encourage regular CBG monitoring Encourage contacting office if excessive hyperglycemia and or hypoglycemia Lifestyle modification with healthy diet (fewer calories, more high fiber foods, whole grains and non-starchy vegetables, lower fat meat and fish, low-fat diary include healthy oils) regular exercise (physical activity)  Opthalmology exam discussed  Nutritional consult recommended Regular dental visits encouraged Home BP monitoring also encouraged goal <130/80 Discussed endo referral due to lack of compliance and elevated A1C   Relevant Medications   glipiZIDE (GLUCOTROL) 5 MG tablet   Other Relevant Orders   HgB A1c (Completed)   Comp. Metabolic Panel (12)   Lipid panel     Other   Hyperlipidemia Persistent  Labs pending   Relevant Orders   Lipid panel   Other Visit Diagnoses     Flu vaccine need       Relevant Orders   Flu Vaccine QUAD 63moIM (Fluarix, Fluzone & Alfiuria Quad PF) (Completed)       Meds ordered this encounter  Medications   glipiZIDE (GLUCOTROL) 5 MG tablet    Sig: Take 1 tablet (5 mg total) by mouth 2 (two) times daily before a meal.    Dispense:  60 tablet    Refill:  3    Order Specific Question:   Supervising Provider    Answer:   JTresa Garter[[[id number]   Follow-up: Return in about 4 weeks (around 09/11/2021) for follow up [id number].    CVevelyn Francois NP

## 2021-08-14 NOTE — Patient Instructions (Signed)
Diabetes Mellitus and Nutrition, Adult When you have diabetes, or diabetes mellitus, it is very important to have healthy eating habits because your blood sugar (glucose) levels are greatly affected by what you eat and drink. Eating healthy foods in the right amounts, at about the same times every day, can help you:  Control your blood glucose.  Lower your risk of heart disease.  Improve your blood pressure.  Reach or maintain a healthy weight. What can affect my meal plan? Every person with diabetes is different, and each person has different needs for a meal plan. Your health care provider may recommend that you work with a dietitian to make a meal plan that is best for you. Your meal plan may vary depending on factors such as:  The calories you need.  The medicines you take.  Your weight.  Your blood glucose, blood pressure, and cholesterol levels.  Your activity level.  Other health conditions you have, such as heart or kidney disease. How do carbohydrates affect me? Carbohydrates, also called carbs, affect your blood glucose level more than any other type of food. Eating carbs naturally raises the amount of glucose in your blood. Carb counting is a method for keeping track of how many carbs you eat. Counting carbs is important to keep your blood glucose at a healthy level, especially if you use insulin or take certain oral diabetes medicines. It is important to know how many carbs you can safely have in each meal. This is different for every person. Your dietitian can help you calculate how many carbs you should have at each meal and for each snack. How does alcohol affect me? Alcohol can cause a sudden decrease in blood glucose (hypoglycemia), especially if you use insulin or take certain oral diabetes medicines. Hypoglycemia can be a life-threatening condition. Symptoms of hypoglycemia, such as sleepiness, dizziness, and confusion, are similar to symptoms of having too much  alcohol.  Do not drink alcohol if: ? Your health care provider tells you not to drink. ? You are pregnant, may be pregnant, or are planning to become pregnant.  If you drink alcohol: ? Do not drink on an empty stomach. ? Limit how much you use to:  0-1 drink a day for women.  0-2 drinks a day for men. ? Be aware of how much alcohol is in your drink. In the [place] one drink equals one 12 oz bottle of beer (355 mL), one 5 oz glass of wine (148 mL), or one 1 oz glass of hard liquor (44 mL). ? Keep yourself hydrated with water, diet soda, or unsweetened iced tea.  Keep in mind that regular soda, juice, and other mixers may contain a lot of sugar and must be counted as carbs. What are tips for following this plan? Reading food labels  Start by checking the serving size on the "Nutrition Facts" label of packaged foods and drinks. The amount of calories, carbs, fats, and other nutrients listed on the label is based on one serving of the item. Many items contain more than one serving per package.  Check the total grams (g) of carbs in one serving. You can calculate the number of servings of carbs in one serving by dividing the total carbs by 15. For example, if a food has 30 g of total carbs per serving, it would be equal to 2 servings of carbs.  Check the number of grams (g) of saturated fats and trans fats in one serving. Choose foods that have   a low amount or none of these fats.  Check the number of milligrams (mg) of salt (sodium) in one serving. Most people should limit total sodium intake to less than 2,300 mg per day.  Always check the nutrition information of foods labeled as "low-fat" or "nonfat." These foods may be higher in added sugar or refined carbs and should be avoided.  Talk to your dietitian to identify your daily goals for nutrients listed on the label. Shopping  Avoid buying canned, pre-made, or processed foods. These foods tend to be high in fat, sodium, and added  sugar.  Shop around the outside edge of the grocery store. This is where you will most often find fresh fruits and vegetables, bulk grains, fresh meats, and fresh dairy. Cooking  Use low-heat cooking methods, such as baking, instead of high-heat cooking methods like deep frying.  Cook using healthy oils, such as olive, canola, or sunflower oil.  Avoid cooking with butter, cream, or high-fat meats. Meal planning  Eat meals and snacks regularly, preferably at the same times every day. Avoid going long periods of time without eating.  Eat foods that are high in fiber, such as fresh fruits, vegetables, beans, and whole grains. Talk with your dietitian about how many servings of carbs you can eat at each meal.  Eat 4-6 oz (112-168 g) of lean protein each day, such as lean meat, chicken, fish, eggs, or tofu. One ounce (oz) of lean protein is equal to: ? 1 oz (28 g) of meat, chicken, or fish. ? 1 egg. ?  cup (62 g) of tofu.  Eat some foods each day that contain healthy fats, such as avocado, nuts, seeds, and fish.   What foods should I eat? Fruits Berries. Apples. Oranges. Peaches. Apricots. Plums. Grapes. Mango. Papaya. Pomegranate. Kiwi. Cherries. Vegetables Lettuce. Spinach. Leafy greens, including kale, chard, collard greens, and mustard greens. Beets. Cauliflower. Cabbage. Broccoli. Carrots. Green beans. Tomatoes. Peppers. Onions. Cucumbers. Brussels sprouts. Grains Whole grains, such as whole-wheat or whole-grain bread, crackers, tortillas, cereal, and pasta. Unsweetened oatmeal. Quinoa. Brown or wild rice. Meats and other proteins Seafood. Poultry without skin. Lean cuts of poultry and beef. Tofu. Nuts. Seeds. Dairy Low-fat or fat-free dairy products such as milk, yogurt, and cheese. The items listed above may not be a complete list of foods and beverages you can eat. Contact a dietitian for more information. What foods should I avoid? Fruits Fruits canned with  syrup. Vegetables Canned vegetables. Frozen vegetables with butter or cream sauce. Grains Refined white flour and flour products such as bread, pasta, snack foods, and cereals. Avoid all processed foods. Meats and other proteins Fatty cuts of meat. Poultry with skin. Breaded or fried meats. Processed meat. Avoid saturated fats. Dairy Full-fat yogurt, cheese, or milk. Beverages Sweetened drinks, such as soda or iced tea. The items listed above may not be a complete list of foods and beverages you should avoid. Contact a dietitian for more information. Questions to ask a health care provider  Do I need to meet with a diabetes educator?  Do I need to meet with a dietitian?  What number can I call if I have questions?  When are the best times to check my blood glucose? Where to find more information:  American Diabetes Association: diabetes.org  Academy of Nutrition and Dietetics: www.eatright.org  National Institute of Diabetes and Digestive and Kidney Diseases: www.niddk.nih.gov  Association of Diabetes Care and Education Specialists: www.diabeteseducator.org Summary  It is important to have healthy eating   habits because your blood sugar (glucose) levels are greatly affected by what you eat and drink.  A healthy meal plan will help you control your blood glucose and maintain a healthy lifestyle.  Your health care provider may recommend that you work with a dietitian to make a meal plan that is best for you.  Keep in mind that carbohydrates (carbs) and alcohol have immediate effects on your blood glucose levels. It is important to count carbs and to use alcohol carefully. This information is not intended to replace advice given to you by your health care provider. Make sure you discuss any questions you have with your health care provider. Document Revised: 09/29/2019 Document Reviewed: 09/29/2019 Elsevier Patient Education  2021 [employer]vier Inc.  

## 2021-08-15 LAB — COMP. METABOLIC PANEL (12)
AST: 19 IU/L (ref 0–40)
Albumin/Globulin Ratio: 1.8 (ref 1.2–2.2)
Albumin: 4.4 g/dL (ref 3.8–4.8)
Alkaline Phosphatase: 73 IU/L (ref 44–121)
BUN/Creatinine Ratio: 21 (ref 10–24)
BUN: 27 mg/dL (ref 8–27)
Bilirubin Total: 0.3 mg/dL (ref 0.0–1.2)
Calcium: 9.3 mg/dL (ref 8.6–10.2)
Chloride: 101 mmol/L (ref 96–106)
Creatinine, Ser: 1.28 mg/dL — ABNORMAL HIGH (ref 0.76–1.27)
Globulin, Total: 2.4 g/dL (ref 1.5–4.5)
Glucose: 241 mg/dL — ABNORMAL HIGH (ref 70–99)
Potassium: 5.1 mmol/L (ref 3.5–5.2)
Sodium: 137 mmol/L (ref 134–144)
Total Protein: 6.8 g/dL (ref 6.0–8.5)
eGFR: 64 mL/min/{1.73_m2} (ref >59)

## 2021-08-15 LAB — LIPID PANEL
Chol/HDL Ratio: 3 ratio (ref 0.0–5.0)
Cholesterol, Total: 214 mg/dL — ABNORMAL HIGH (ref 100–199)
HDL: 71 mg/dL (ref >39)
LDL Chol Calc (NIH): 124 mg/dL — ABNORMAL HIGH (ref 0–99)
Triglycerides: 110 mg/dL (ref 0–149)
VLDL Cholesterol Cal: 19 mg/dL (ref 5–40)

## 2021-08-17 ENCOUNTER — Ambulatory Visit: Payer: Commercial Managed Care - PPO | Admitting: Physical Medicine and Rehabilitation

## 2021-08-24 ENCOUNTER — Ambulatory Visit: Payer: Commercial Managed Care - PPO | Admitting: Physical Medicine and Rehabilitation

## 2021-09-04 ENCOUNTER — Telehealth: Payer: Self-pay | Admitting: Physical Medicine and Rehabilitation

## 2021-09-04 ENCOUNTER — Ambulatory Visit: Payer: Commercial Managed Care - PPO | Admitting: Physical Medicine and Rehabilitation

## 2021-09-04 NOTE — Telephone Encounter (Signed)
Patient called. He would like to [facility] his appointment with Dr. Ernestina Patches.

## 2021-09-04 NOTE — Telephone Encounter (Signed)
Rescheduled

## 2021-09-11 ENCOUNTER — Ambulatory Visit: Payer: Commercial Managed Care - PPO | Admitting: Physical Medicine and Rehabilitation

## 2021-09-15 ENCOUNTER — Ambulatory Visit (INDEPENDENT_AMBULATORY_CARE_PROVIDER_SITE_OTHER): Payer: Commercial Managed Care - PPO | Admitting: Physical Medicine and Rehabilitation

## 2021-09-15 ENCOUNTER — Encounter: Payer: Self-pay | Admitting: Physical Medicine and Rehabilitation

## 2021-09-15 ENCOUNTER — Other Ambulatory Visit: Payer: Self-pay

## 2021-09-15 VITALS — BP 119/79 | HR 68

## 2021-09-15 DIAGNOSIS — M545 Low back pain, unspecified: Secondary | ICD-10-CM

## 2021-09-15 DIAGNOSIS — G8929 Other chronic pain: Secondary | ICD-10-CM

## 2021-09-15 DIAGNOSIS — M5136 Other intervertebral disc degeneration, lumbar region: Secondary | ICD-10-CM | POA: Diagnosis not present

## 2021-09-15 DIAGNOSIS — M47819 Spondylosis without myelopathy or radiculopathy, site unspecified: Secondary | ICD-10-CM

## 2021-09-15 DIAGNOSIS — M47816 Spondylosis without myelopathy or radiculopathy, lumbar region: Secondary | ICD-10-CM | POA: Diagnosis not present

## 2021-09-15 NOTE — Progress Notes (Signed)
Efrem Pitstick - 60 y.o. male MRN [id number]  Date of birth: 21-Jun-1961  Office Visit Note: Visit Date: 09/15/2021 PCP: Vevelyn Francois, NP Referred by: Vevelyn Francois, NP  Subjective: Chief Complaint  Patient presents with   Lower Back - Pain   Right Hip - Pain   Left Hip - Pain   HPI: Dean Robertson is a 60 y.o. male who comes in today for evaluation of chronic bilateral lower back pain radiating to buttocks. Patient reports pain has been ongoing for months. His pain is exacerbated by laying down and moving from a sitting to standing position. Patient describes pain as soreness and aching sensation, currently rates as 2 out of 10. Patient reports some relief of pain with heating pad and use of medications. Patient did have referral placed for physical therapy in May by Dr. Eunice Blase, however he did not attend due to other family issues/stressors. Patient's recent lumbar MRI exhibits lumbar spondylosis, short pedicles causing moderate impingement at L2-3 and L4-5, and mild impingement at L3-4 and L5-S1. Patient had surgical consultation with Dr. Basil Dess on 07/26/2021 and feels that patient is not a surgical candidate at this time, however he could benefit from facet joint blocks with possible radiofrequency ablation. Patient did have bilateral L2 transforaminal epidural steroid injection by Dr. Magnus Sinning on 02/23/2021 that did help to alleviate pain, however pain relief was short lived. Patient states his pain is not severe at this time, currently rates as 4 out of 10 and reports he would like to attend formal physical therapy before moving forward with more invasive procedures.   Patient states he has always been an active person, was employed at [employer] until 2015 when he was put on disability due to chronic back issues. Patient denies focal weakness, numbness and tingling. Patient denies recent trauma or falls.     Review of Systems  Musculoskeletal:  Positive for back  pain.  Neurological:  Negative for tingling, sensory change, focal weakness and weakness.  All other systems reviewed and are negative. Otherwise per HPI.  Assessment & Plan: Visit Diagnoses:    ICD-10-CM   1. Chronic bilateral low back pain without sciatica  M54.50 Ambulatory referral to Physical Therapy   G89.29     2. Spondylosis without myelopathy or radiculopathy  M47.819     3. Other intervertebral disc degeneration, lumbar region  M51.36     4. Facet hypertrophy of lumbar region  M47.816        Plan: Findings:  Chronic bilateral lower back pain radiating to buttocks. Patient continues to have pain despite good conservative therapies such as use of heating pad and medications as needed. Patient's clinical presentation and exam are consistent with facet mediated pain. We feel the next step is to place referral for formal physical therapy with our in house team, which we did complete today. Patient voices that his pain is manageable at home right now and would like to try non-invasive treatments before proceeding with injections. Per Dr. Wandra Feinstein notes from previous consultation, patient is not a surgical candidate at this time. If patient does well with physical therapy we will continue to monitor, however if his pain worsens we would consider performing facet joint injections and possible radiofrequency ablation if warranted. We would like to see patient back in approximately 6 weeks for re-evaluation after he has completed several sessions of physical therapy. Patient instructed to let us know if his pain worsens. No red flag symptoms noted  upon exam today.    Meds & Orders: No orders of the defined types were placed in this encounter.   Orders Placed This Encounter  Procedures   Ambulatory referral to Physical Therapy     Follow-up: Return in about 6 weeks (around 10/27/2021) for re-evaluation of pain after physical therapy.   Procedures: No procedures performed       Clinical History: MRI LUMBAR SPINE WITHOUT AND WITH CONTRAST   TECHNIQUE: Multiplanar and multiecho pulse sequences of the lumbar spine were obtained without and with intravenous contrast.   CONTRAST:  50mL GADAVIST GADOBUTROL 1 MMOL/ML IV SOLN   COMPARISON:  Radiographs from 07/28/2020   FINDINGS: Segmentation: The lowest lumbar type non-rib-bearing vertebra is labeled as L5.   Alignment:  No vertebral subluxation is observed.   Vertebrae: Congenitally short pedicles. Disc desiccation at L2-3, L4-5, and L5-S1 with mild loss of disc height at L2-3 and L5-S1. Type 2 degenerative endplate findings at W9-U0. No significant vertebral marrow edema is identified.   Conus medullaris and cauda equina: Conus extends to the L1 level. Conus and cauda equina appear normal.   Paraspinal and other soft tissues: Mild sclerosis along the SI joints may reflect low-grade bilateral sacroiliitis.   Disc levels:   L1-2: Unremarkable.   L2-3: Moderate central narrowing of the thecal sac and mild to moderate left subarticular lateral recess stenosis due to short pedicles, disc bulge, and left lateral recess disc protrusion. AP diameter of the thecal sac is narrowed to 0.8 cm.   L3-4: Mild central narrowing of the thecal sac due to short pedicles, disc bulge, and left paracentral disc protrusion. The AP diameter of the thecal sac is narrowed to 0.9 cm.   L4-5: Moderate central narrowing of the thecal sac with mild bilateral subarticular lateral recess stenosis and mild left foraminal stenosis due to diffuse disc bulge, left facet arthropathy, and congenitally short pedicles. The AP diameter of the thecal sac is narrowed to 0.7 cm.   L5-S1: Mild bilateral subarticular lateral recess stenosis and mild right foraminal stenosis due to diffuse disc bulge, intervertebral spurring, and facet spurring.   IMPRESSION: 1. Lumbar spondylosis, degenerative disc disease, and congenitally short  pedicles causing moderate impingement at L2-3 and L4-5, and mild impingement at L3-4 and L5-S1. 2. Mild sclerosis along the SI joints may reflect low-grade bilateral sacroiliitis.     Electronically Signed   By: Van Clines M.D.   On: 01/02/2021 12:35   He reports that he has quit smoking. He has never used smokeless tobacco.  Recent Labs    08/14/21 1506  HGBA1C 11.4*  11.4*  11.4  11.4*    Objective:  VS:  HT:    WT:   BMI:     BP:119/79  HR:68bpm  TEMP: ( )  RESP:  Physical Exam Vitals and nursing note reviewed.  HENT:     Head: Normocephalic and atraumatic.     Right Ear: External ear normal.     Left Ear: External ear normal.     Nose: Nose normal.     Mouth/Throat:     Mouth: Mucous membranes are moist.  Eyes:     Extraocular Movements: Extraocular movements intact.  Cardiovascular:     Rate and Rhythm: Normal rate.     Pulses: Normal pulses.  Pulmonary:     Effort: Pulmonary effort is normal.  Abdominal:     General: Abdomen is flat. There is no distension.  Musculoskeletal:  General: Tenderness present.     Cervical back: Normal range of motion.     Comments: Pt is slow to rise from seated position to standing. Concordant low back pain with facet loading, lumbar spine extension and rotation. Strong distal strength without clonus, no pain upon palpation of greater trochanters. Sensation intact bilaterally. Walks independently, gait steady.       Skin:    General: Skin is warm and dry.     Capillary Refill: Capillary refill takes less than 2 seconds.  Neurological:     General: No focal deficit present.     Mental Status: He is alert and oriented to person, place, and time.    Ortho Exam  Imaging: No results found.  Past Medical/Family/Surgical/Social History: Medications & Allergies reviewed per EMR, new medications updated. Patient Active Problem List   Diagnosis Date Noted   Non-ST elevation (NSTEMI) myocardial infarction [facility])     Acute coronary syndrome ([place]) 12/22/2020   Tick bite 05/17/2020   Anxiety 02/05/2019   Essential hypertension 12/08/2017   Type 2 diabetes mellitus without complication, with long-term current use of insulin ([place]) 12/08/2017   Chest pain 10/04/2017   ASHD (arteriosclerotic heart disease) 11/15/2015   Hyperlipidemia 11/15/2015   Neuropathy 11/15/2015   Sprain/strain 11/27/2013   Past Medical History:  Diagnosis Date   Anxiety    Arthritis    shoulders   Coronary artery disease    Diabetes mellitus without complication (HCC)    H/O heart artery stent    x1   High cholesterol    Hypertension    Family History  Problem Relation Age of Onset   Hypertension Mother    Diabetes Father    Colon polyps Neg Hx    Colon cancer Neg Hx    Esophageal cancer Neg Hx    Rectal cancer Neg Hx    Stomach cancer Neg Hx    Past Surgical History:  Procedure Laterality Date   COLONOSCOPY     2012- normal    CORONARY ANGIOPLASTY WITH STENT PLACEMENT     CORONARY STENT INTERVENTION N/A 12/23/2020   Procedure: CORONARY STENT INTERVENTION;  Surgeon: Sherren Mocha, MD;  Location: [facility];  Service: Cardiovascular;  Laterality: N/A;   LEFT HEART CATH AND CORONARY ANGIOGRAPHY N/A 12/23/2020   Procedure: LEFT HEART CATH AND CORONARY ANGIOGRAPHY;  Surgeon: Charolette Forward, MD;  Location: [facility];  Service: Cardiovascular;  Laterality: N/A;   Social History   Occupational History   Not on file  Tobacco Use   Smoking status: Former   Smokeless tobacco: Never  Vaping Use   Vaping Use: Never used  Substance and Sexual Activity   Alcohol use: Yes    Comment: occ   Drug use: Not Currently   Sexual activity: Not Currently    Birth control/protection: None

## 2021-09-15 NOTE — Progress Notes (Signed)
Pain in center of low back and bilateral buttock pain. Sometimes has bilateral groin pain. Worse with sitting. Pulling sensation when standing.  Numeric Pain Rating Scale and Functional Assessment Average Pain 8 Pain Right Now 4 My pain is constant, sharp, dull, and aching Pain is worse with: sitting and standing Pain improves with: medication   In the last MONTH (on 0-10 scale) has pain interfered with the following?  1. General activity like being  able to carry out your everyday physical activities such as walking, climbing stairs, carrying groceries, or moving a chair?  Rating(5)  2. Relation with others like being able to carry out your usual social activities and roles such as  activities at home, at work and in your community. Rating(5)  3. Enjoyment of life such that you have  been bothered by emotional problems such as feeling anxious, depressed or irritable?  Rating(3)

## 2021-09-18 ENCOUNTER — Ambulatory Visit: Payer: Commercial Managed Care - PPO | Admitting: Nurse Practitioner

## 2021-09-26 ENCOUNTER — Ambulatory Visit: Payer: Commercial Managed Care - PPO | Admitting: Rehabilitative and Restorative Service Providers"

## 2021-10-23 ENCOUNTER — Ambulatory Visit: Payer: Commercial Managed Care - PPO | Admitting: Physical Medicine and Rehabilitation

## 2021-11-08 ENCOUNTER — Other Ambulatory Visit: Payer: Self-pay

## 2021-11-08 ENCOUNTER — Ambulatory Visit: Payer: Medicare HMO | Attending: Physical Medicine and Rehabilitation | Admitting: Physical Therapy

## 2021-11-08 DIAGNOSIS — M5441 Lumbago with sciatica, right side: Secondary | ICD-10-CM | POA: Insufficient documentation

## 2021-11-08 DIAGNOSIS — G8929 Other chronic pain: Secondary | ICD-10-CM | POA: Diagnosis present

## 2021-11-08 DIAGNOSIS — M5442 Lumbago with sciatica, left side: Secondary | ICD-10-CM | POA: Insufficient documentation

## 2021-11-08 DIAGNOSIS — M545 Low back pain, unspecified: Secondary | ICD-10-CM | POA: Diagnosis not present

## 2021-11-08 DIAGNOSIS — R293 Abnormal posture: Secondary | ICD-10-CM | POA: Diagnosis present

## 2021-11-08 DIAGNOSIS — M62830 Muscle spasm of back: Secondary | ICD-10-CM | POA: Diagnosis not present

## 2021-11-08 NOTE — Patient Instructions (Signed)
Access Code: [place] URL: [email or web address] Date: 11/08/2021 Prepared by: Glenetta Hew  Exercises Prone Knee Flexion - 1 x daily - 7 x weekly - 2 sets - 10 reps Prone Hip Extension - 1 x daily - 7 x weekly - 2 sets - 10 reps Prone Press Up - 1 x daily - 7 x weekly - 2 sets - 10 reps Child's Pose Stretch - 1 x daily - 7 x weekly - 1 sets - 3 reps - 15 sec hold

## 2021-11-08 NOTE — Therapy (Signed)
[facility] [address]  [place], [place], [address] Phone: [phone]   Fax:  [phone]  Physical Therapy Evaluation  Patient Details  Name: Dean Robertson MRN: [id number] Date of Birth: Nov 29, 1960 Referring Provider (PT): Lorine Bears   Encounter Date: 11/08/2021   PT End of Session - 11/08/21 1635     Visit Number 1    Number of Visits 12    Date for PT Re-Evaluation 12/20/21    Authorization Type [employer]    Progress Note Due on Visit 10    PT Start Time 1540    PT Stop Time 1630    PT Time Calculation (min) 50 min             Past Medical History:  Diagnosis Date   Anxiety    Arthritis    shoulders   Coronary artery disease    Diabetes mellitus without complication ([place])    H/O heart artery stent    x1   High cholesterol    Hypertension     Past Surgical History:  Procedure Laterality Date   COLONOSCOPY     2012- normal    CORONARY ANGIOPLASTY WITH STENT PLACEMENT     CORONARY STENT INTERVENTION N/A 12/23/2020   Procedure: CORONARY STENT INTERVENTION;  Surgeon: Sherren Mocha, MD;  Location: [facility];  Service: Cardiovascular;  Laterality: N/A;   LEFT HEART CATH AND CORONARY ANGIOGRAPHY N/A 12/23/2020   Procedure: LEFT HEART CATH AND CORONARY ANGIOGRAPHY;  Surgeon: Charolette Forward, MD;  Location: [facility];  Service: Cardiovascular;  Laterality: N/A;    There were no vitals filed for this visit.    Subjective Assessment - 11/08/21 1548     Subjective Pt. reports pain started in 2010, but got a lot worst in 2015, when he applied for disability.  His pain started again a few months ago.   He had 4 shots, but they only helped for about a week.  He has a lot of pain whenever he tries to sit down "that sciatic pain" down his buttocks, but once seated he is ok.    Pertinent History Patient reports pain has been ongoing for months. His pain is exacerbated by laying  down and moving from a sitting to standing position. Patient describes pain as sorenes  Patient had surgical consultation with Dr. Basil Dess on 07/26/2021 and feels that patient is not a surgical candidate at this time, however he could benefit from facet joint blocks with possible radiofrequency ablation. Patient did have bilateral L2 transforaminal epidural steroid injection by Dr. Magnus Sinning on 02/23/2021 that did help to alleviate pain, however pain relief was short lived. Patient states his pain is not severe at this time, currently rates as 4 out of 10 and reports he would like to attend formal physical therapy before moving forward with more invasive procedures.    Limitations Sitting    How long can you sit comfortably? can sit all day, but transfers into sitting is extremely painful    How long can you stand comfortably? 3 hours    How long can you walk comfortably? some pain with walking, varies    Diagnostic tests recent lumbar MRI exhibits lumbar spondylosis, short pedicles causing moderate impingement at L2-3 and L4-5, and mild impingement at L3-4 and L5-S1.    Patient Stated Goals "to get better"  "move better"    Currently in Pain? Yes    Pain  Score 3    10/10 getting into seated position.   Pain Location Back    Pain Orientation Lower    Pain Descriptors / Indicators Aching    Pain Type Chronic pain    Pain Radiating Towards bil buttocks posterior to knees, occasionally into groin    Pain Onset More than a month ago    Pain Frequency Intermittent    Aggravating Factors  transitional movements, standing too long    Pain Relieving Factors pain pills    Effect of Pain on Daily Activities had to stop working and go on disability                [facility] PT Assessment - 11/08/21 0001       Assessment   Medical Diagnosis M54.50,G89.29 (ICD-10-CM) - Chronic bilateral low back pain without sciatica    Referring Provider (PT) Jimmye Norman, Megan E    Onset Date/Surgical Date --    ongoing since 2010     Precautions   Precautions None      Restrictions   Weight Bearing Restrictions No      Balance Screen   Has the patient fallen in the past 6 months No    Has the patient had a decrease in activity level because of a fear of falling?  No    Is the patient reluctant to leave their home because of a fear of falling?  No      Home Environment   Living Environment Private residence    Living Arrangements Alone    Type of [place] Access Level entry    Home Layout One level      Prior Function   Level of [place] On disability    Leisure fishing      Cognition   Overall Cognitive Status Within Functional Limits for tasks assessed      Observation/Other Assessments   Observations sacral sits, very gaurded slow movment with sitting.    Focus on Therapeutic Outcomes (FOTO)  lumbar 52%   59% predicted after 11 visits     Sensation   Additional Comments reports neuropathy in hands and feet.      Posture/Postural Control   Posture Comments sacral sits      ROM / Strength   AROM / PROM / Strength AROM;PROM;Strength      AROM   Overall AROM  Within functional limits for tasks performed    Overall AROM Comments just slow and gaurded, pain with extension R side low back.      PROM   Overall PROM  Deficits    Overall PROM Comments significant tightness in both hips for ER, R tighter than left by 10 deg (measured in FABER position), more IR than ER.   Very gaurded as well.      Strength   Overall Strength Within functional limits for tasks performed    Overall Strength Comments 5/5 bil LE strength all myotomes.      Flexibility   Soft Tissue Assessment /Muscle Length yes    Hamstrings mild tightness bil but SLR to 90 deg.    Quadriceps tightness bil with prone knee bends    Piriformis tightness bil      Palpation   Spinal mobility decreased mobility lumbar spine with PA mobs, noted hyperreactivity of lumbar  paraspinals    SI assessment  tenderness bil SIJ, R>L    Palpation comment tenderness throughout lumbar musculature, R >L,  including multifidi, erector spinae, QL.  Glutes are 5/5 strength but feel atrophied over piriformis.      Special Tests   Other special tests neg SLR and FABER for back pain bil                        Objective measurements completed on examination: See above findings.       [facility] Adult PT Treatment/Exercise - 11/08/21 0001       Self-Care   Self-Care Other Self-Care Comments    Other Self-Care Comments  see patient education      Exercises   Exercises Lumbar      Lumbar Exercises: Prone   Straight Leg Raise 5 reps    Other Prone Lumbar Exercises prone knee bends x 10 bil    Other Prone Lumbar Exercises prone press-ups x 10, child pose x 15 sec                     PT Education - 11/08/21 1634     Education Details educated on findings, plan of care, and initial HEP focusing on McKenzie extension.  Access Code: [place]   Also started to educate on pain neuroscience.  Recommended thinning out wallet or placing in front pocket as well to decrease pressure on sciatic nerve.    Person(s) Educated Patient    Methods Explanation;Demonstration;Verbal cues;Handout    Comprehension Verbalized understanding;Returned demonstration              PT Short Term Goals - 11/08/21 1651       PT SHORT TERM GOAL #1   Title Pt. will be independent with initial HEP.    Time 2    Period Weeks    Status New    Target Date 11/22/21               PT Long Term Goals - 11/08/21 1651       PT LONG TERM GOAL #1   Title Pt. will be compliant with progressed HEP to improve outcomes.    Time 6    Period Weeks    Status New    Target Date 12/20/21      PT LONG TERM GOAL #2   Title Pt. will be able to transition from standing to sitting without pain.    Baseline 10/10 buttock pain with transitions to sitting.    Time 6    Period  Weeks    Status New    Target Date 12/20/21      PT LONG TERM GOAL #3   Title Pt. will report 75% improvement in back pain and radicular symptoms.    Time 6    Period Weeks    Status New    Target Date 12/20/21      PT LONG TERM GOAL #4   Title Pt. will be able to transition to gym based exercise program.    Baseline would like to join gym, stopped exercising because of pain    Time 6    Period Weeks    Status New    Target Date 12/20/21      PT LONG TERM GOAL #5   Title Pt. will improve score on FOTO to = or > 59% to demonstrate improve QOL.    Baseline 52%    Time 6    Period Weeks    Status New    Target Date 12/20/21  Plan - 11/08/21 1643     Clinical Impression Statement Dean Robertson is a 61 year old referred for chronic LBP radiating down bil LE to knees.  He reports long history of chronic LBP, resulting in disability.  With recent excacerbation he has significant difficulty with transitional movements.   Today he demonstrates 5/5 bil LE strength, good lumbar AROM, but significant tightness in bil hips especially with ER, and significant muscle gaurding and spasm.  He responded well to initial Mckenzie prone extension exercises today, reporting decreased difficulty as progressed "feels good" so given for HEP.  He would benefit from skilled physical therapy to decrease pain and spasm, improve exercise tolerance, and improve ability to perform ADLs and desired activities without limitation.    Personal Factors and Comorbidities Time since onset of injury/illness/exacerbation;Comorbidity 3+    Comorbidities T2DM with polyneuropathy, history MI, heart disease, chronic LBP    Examination-Activity Limitations Stairs;Stand;Sit;Lift;Locomotion Level;Squat;Bend    Examination-Participation Restrictions Driving;Cleaning;Community Activity;Other   fishing   Stability/Clinical Decision Making Evolving/Moderate complexity    Clinical Decision Making  Moderate    Rehab Potential Good    PT Frequency 2x / week    PT Duration 6 weeks    PT Treatment/Interventions ADLs/Self Care Home Management;Cryotherapy;Electrical Stimulation;Moist Heat;Traction;Ultrasound;Stair training;Functional mobility training;Therapeutic activities;Therapeutic exercise;Balance training;Gait training;Neuromuscular re-education;Passive range of motion;Dry needling;Manual techniques;Taping;Spinal Manipulations;Joint Manipulations    PT Next Visit Plan review and progress HEP as tolerated, consider dry needling to lumbar paraspinals, modalities and manual therapy PRN    PT Home Exercise Plan [place] and Agree with Plan of Care Patient             Patient will benefit from skilled therapeutic intervention in order to improve the following deficits and impairments:  Decreased activity tolerance, Decreased endurance, Decreased range of motion, Increased fascial restricitons, Impaired perceived functional ability, Improper body mechanics, Pain, Decreased mobility, Increased muscle spasms, Impaired flexibility, Postural dysfunction  Visit Diagnosis: Muscle spasm of back  Chronic bilateral low back pain with bilateral sciatica  Posture abnormality     Problem List Patient Active Problem List   Diagnosis Date Noted   Non-ST elevation (NSTEMI) myocardial infarction [facility])    Acute coronary syndrome ([place]) 12/22/2020   Tick bite 05/17/2020   Anxiety 02/05/2019   Essential hypertension 12/08/2017   Type 2 diabetes mellitus without complication, with long-term current use of insulin ([place]) 12/08/2017   Chest pain 10/04/2017   ASHD (arteriosclerotic heart disease) 11/15/2015   Hyperlipidemia 11/15/2015   Neuropathy 11/15/2015   Sprain/strain 11/27/2013    Rennie Natter, PT, DPT  11/08/2021, 4:57 PM  [facility] [address]  [place], [place], [address] Phone: [phone]   Fax:   [phone]  Name: Dean Robertson MRN: [id number] Date of Birth: 09-22-1961

## 2021-11-10 ENCOUNTER — Ambulatory Visit: Payer: Medicare HMO | Admitting: Physical Therapy

## 2021-11-14 ENCOUNTER — Encounter: Payer: Self-pay | Admitting: Physical Therapy

## 2021-11-14 ENCOUNTER — Other Ambulatory Visit: Payer: Self-pay

## 2021-11-14 ENCOUNTER — Ambulatory Visit: Payer: Medicare HMO | Admitting: Physical Therapy

## 2021-11-14 DIAGNOSIS — M62830 Muscle spasm of back: Secondary | ICD-10-CM | POA: Diagnosis not present

## 2021-11-14 DIAGNOSIS — R293 Abnormal posture: Secondary | ICD-10-CM

## 2021-11-14 DIAGNOSIS — G8929 Other chronic pain: Secondary | ICD-10-CM

## 2021-11-14 NOTE — Patient Instructions (Addendum)
Access Code: [place] URL: [email or web address] Date: 11/14/2021 Prepared by: Glenetta Hew  Exercises Supine Lower Trunk Rotation - 1 x daily - 7 x weekly - 2 sets - 10 reps Supine Bridge with Resistance Band - 1 x daily - 7 x weekly - 2 sets - 10 reps Clamshell with Resistance - 1 x daily - 7 x weekly - 2 sets - 10 reps Supine 90/90 Sciatic Nerve Glide with Knee Flexion/Extension - 1 x daily - 7 x weekly - 2 sets - 10 reps Right Standing Lateral Shift Correction at Wall - Repetitions - 1 x daily - 7 x weekly - 1 sets - 10 reps Standing Lumbar Extension at Wall - Forearms - 1 x daily - 7 x weekly - 1 sets - 10 reps

## 2021-11-14 NOTE — Therapy (Signed)
[facility] [address]  [place], [place], [address] Phone: [phone]   Fax:  [phone]  Physical Therapy Treatment  Dean Robertson Details  Name: Dean Robertson MRN: [id number] Date of Birth: [birth year] Referring Provider (PT): Lorine Bears   Encounter Date: 11/14/2021   PT End of Session - 11/14/21 0850     Visit Number 2    Number of Visits 12    Date for PT Re-Evaluation 12/20/21    Authorization Type [employer]    Progress Note Due on Visit 10    PT Start Time [phone]    PT Stop Time 0941    PT Time Calculation (min) 55 min    Activity Tolerance Dean Robertson tolerated treatment well    Behavior During Therapy [facility] for tasks assessed/performed             Past Medical History:  Diagnosis Date   Anxiety    Arthritis    shoulders   Coronary artery disease    Diabetes mellitus without complication ([place])    H/O heart artery stent    x1   High cholesterol    Hypertension     Past Surgical History:  Procedure Laterality Date   COLONOSCOPY     2012- normal    CORONARY ANGIOPLASTY WITH STENT PLACEMENT     CORONARY STENT INTERVENTION N/A 12/23/2020   Procedure: CORONARY STENT INTERVENTION;  Surgeon: Sherren Mocha, MD;  Location: [place] [facility];  Service: Cardiovascular;  Laterality: N/A;   LEFT HEART CATH AND CORONARY ANGIOGRAPHY N/A 12/23/2020   Procedure: LEFT HEART CATH AND CORONARY ANGIOGRAPHY;  Surgeon: Charolette Forward, MD;  Location: [place] [facility];  Service: Cardiovascular;  Laterality: N/A;    There were no vitals filed for this visit.   Subjective Assessment - 11/14/21 0848     Subjective Pt. reports no changes since IE.  Doing well today.    Pertinent History Dean Robertson reports pain has been ongoing for months. His pain is exacerbated by laying down and moving from a sitting to standing position. Dean Robertson describes pain as sorenes  Dean Robertson had surgical consultation with Dr. Basil Dess  on 07/26/2021 and feels that Dean Robertson is not a surgical candidate at this time, however he could benefit from facet joint blocks with possible radiofrequency ablation. Dean Robertson did have bilateral L2 transforaminal epidural steroid injection by Dr. Magnus Sinning on 02/23/2021 that did help to alleviate pain, however pain relief was short lived. Dean Robertson states his pain is not severe at this time, currently rates as 4 out of 10 and reports he would like to attend formal physical therapy before moving forward with more invasive procedures.    Limitations Sitting    How long can you sit comfortably? can sit all day, but transfers into sitting is extremely painful    How long can you stand comfortably? 3 hours    How long can you walk comfortably? some pain with walking, varies    Diagnostic tests recent lumbar MRI exhibits lumbar spondylosis, short pedicles causing moderate impingement at L2-3 and L4-5, and mild impingement at L3-4 and L5-S1.    Dean Robertson Stated Goals "to get better"  "move better"    Currently in Pain? Yes    Pain Score 3     Pain Location Back    Pain Orientation Lower    Pain Onset More than a month ago  [place] Adult PT Treatment/Exercise - 11/14/21 0001       Lumbar Exercises: Stretches   Active Hamstring Stretch 5 reps    Active Hamstring Stretch Limitations for sciatic nerve glide bil    Lower Trunk Rotation 5 reps    Lower Trunk Rotation Limitations 2sets to relax low back between exercises    Hip Flexor Stretch Right;Left;3 reps;10 seconds    Hip Flexor Stretch Limitations supine figure 4 position gapping sciatic    Standing Side Bend Right;Left;5 reps    Standing Side Bend Limitations repeated bil 2 x 5    Standing Extension 10 reps    Standing Extension Limitations against wall      Lumbar Exercises: Aerobic   Nustep L5 x 6 min      Lumbar Exercises: Standing   Functional Squats 10 reps    Functional Squats  Limitations cues, noted improved ability to sit without guarding following      Lumbar Exercises: Supine   Bridge 20 reps    Bridge Limitations 2 x 10 with cues for TrA contraction    Bridge with clamshell 20 reps    Bridge with [employer] Limitations RTB around thighs to activated abductors      Lumbar Exercises: Sidelying   Clam Both;20 reps;Limitations    Clam Limitations RTB      Modalities   Modalities Moist Heat      Moist Heat Therapy   Number Minutes Moist Heat 10 Minutes    Moist Heat Location Lumbar Spine   in prone     Manual Therapy   Manual Therapy Joint mobilization;Soft tissue mobilization    Manual therapy comments to improve lumbar mobility and decrease pain    Joint Mobilization PA mobs lumbar spine grade 2-3    Soft tissue mobilization IASTM with foam roller to bil glutes, proximal hamstrings and lumbar paraspinals                     PT Education - 11/14/21 0931     Education Details HEP update Access Code: [place].  Issued RTB    Person(s) Educated Dean Robertson    Methods Explanation;Demonstration;Handout;Verbal cues    Comprehension Verbalized understanding;Returned demonstration              PT Short Term Goals - 11/14/21 0851       PT SHORT TERM GOAL #1   Title Pt. will be independent with initial HEP.    Time 2    Period Weeks    Status Achieved    Target Date 11/22/21               PT Long Term Goals - 11/14/21 0851       PT LONG TERM GOAL #1   Title Pt. will be compliant with progressed HEP to improve outcomes.    Time 6    Period Weeks    Status On-going    Target Date 12/20/21      PT LONG TERM GOAL #2   Title Pt. will be able to transition from standing to sitting without pain.    Baseline 10/10 buttock pain with transitions to sitting.    Time 6    Period Weeks    Status On-going    Target Date 12/20/21      PT LONG TERM GOAL #3   Title Pt. will report 75% improvement in back pain and radicular  symptoms.    Time 6    Period Weeks  Status On-going    Target Date 12/20/21      PT LONG TERM GOAL #4   Title Pt. will be able to transition to gym based exercise program.    Baseline would like to join gym, stopped exercising because of pain    Time 6    Period Weeks    Status On-going    Target Date 12/20/21      PT LONG TERM GOAL #5   Title Pt. will improve score on FOTO to = or > 59% to demonstrate improve QOL.    Baseline 52%    Time 6    Period Weeks    Status On-going    Target Date 12/20/21                   Plan - 11/14/21 0933     Clinical Impression Statement Dean Robertson reports good tolerance of extension based exercises, so continued to progress HEP focusing on extension and nerve glides.  He tolerated exercises well, needed some cuing to not push into pain and perform to tolerance followed by manual therapy to improve vertebral mobility.  MHP at end of session for Dean Robertson comfort.    Personal Factors and Comorbidities Time since onset of injury/illness/exacerbation;Comorbidity 3+    Comorbidities T2DM with polyneuropathy, history MI, heart disease, chronic LBP    Examination-Activity Limitations Stairs;Stand;Sit;Lift;Locomotion Level;Squat;Bend    Examination-Participation Restrictions Driving;Cleaning;Community Activity;Other   fishing   Stability/Clinical Decision Making Evolving/Moderate complexity    Rehab Potential Good    PT Frequency 2x / week    PT Duration 6 weeks    PT Treatment/Interventions ADLs/Self Care Home Management;Cryotherapy;Electrical Stimulation;Moist Heat;Traction;Ultrasound;Stair training;Functional mobility training;Therapeutic activities;Therapeutic exercise;Balance training;Gait training;Neuromuscular re-education;Passive range of motion;Dry needling;Manual techniques;Taping;Spinal Manipulations;Joint Manipulations    PT Next Visit Plan review and progress HEP as tolerated, consider dry needling to lumbar paraspinals, modalities  and manual therapy PRN    PT Home Exercise Plan [place] and Agree with Plan of Care Dean Robertson             Dean Robertson will benefit from skilled therapeutic intervention in order to improve the following deficits and impairments:  Decreased activity tolerance, Decreased endurance, Decreased range of motion, Increased fascial restricitons, Impaired perceived functional ability, Improper body mechanics, Pain, Decreased mobility, Increased muscle spasms, Impaired flexibility, Postural dysfunction  Visit Diagnosis: Chronic bilateral low back pain with bilateral sciatica  Muscle spasm of back  Posture abnormality     Problem List Dean Robertson Active Problem List   Diagnosis Date Noted   Non-ST elevation (NSTEMI) myocardial infarction [facility])    Acute coronary syndrome ([place]) 12/22/2020   Tick bite 05/17/2020   Anxiety 02/05/2019   Essential hypertension 12/08/2017   Type 2 diabetes mellitus without complication, with long-term current use of insulin ([place]) 12/08/2017   Chest pain 10/04/2017   ASHD (arteriosclerotic heart disease) 11/15/2015   Hyperlipidemia 11/15/2015   Neuropathy 11/15/2015   Sprain/strain 11/27/2013    Rennie Natter, PT, DPT  11/14/2021, 10:45 AM  [facility] [address]  [place], [place], [address] Phone: [phone]   Fax:  [phone]  Name: Dean Robertson MRN: [id number] Date of Birth: Jan 24, 1961

## 2021-11-17 ENCOUNTER — Ambulatory Visit: Payer: Medicare HMO | Admitting: Physical Therapy

## 2021-11-22 ENCOUNTER — Ambulatory Visit: Payer: Medicare HMO

## 2021-11-28 ENCOUNTER — Other Ambulatory Visit: Payer: Self-pay

## 2021-11-28 ENCOUNTER — Ambulatory Visit: Payer: Medicare HMO

## 2021-11-28 DIAGNOSIS — R293 Abnormal posture: Secondary | ICD-10-CM

## 2021-11-28 DIAGNOSIS — M62830 Muscle spasm of back: Secondary | ICD-10-CM | POA: Diagnosis not present

## 2021-11-28 DIAGNOSIS — G8929 Other chronic pain: Secondary | ICD-10-CM

## 2021-11-28 NOTE — Patient Instructions (Signed)
Access Code: [place] URL: [email or web address] Date: 11/28/2021 Prepared by: Clarene Essex  Exercises Prone Press Up - 1 x daily - 7 x weekly - 2 sets - 10 reps Child's Pose Stretch - 1 x daily - 7 x weekly - 1 sets - 3 reps - 15 sec hold Supine Lower Trunk Rotation - 1 x daily - 7 x weekly - 2 sets - 10 reps Supine Bridge with Resistance Band - 1 x daily - 7 x weekly - 2 sets - 10 reps Clamshell with Resistance - 1 x daily - 7 x weekly - 2 sets - 10 reps Supine 90/90 Sciatic Nerve Glide with Knee Flexion/Extension - 1 x daily - 7 x weekly - 2 sets - 10 reps Right Standing Lateral Shift Correction at Wall - Repetitions - 1 x daily - 7 x weekly - 1 sets - 10 reps Standing Lumbar Extension at Wall - Forearms - 1 x daily - 7 x weekly - 1 sets - 10 reps Supine Hamstring Stretch with Strap - 2 x daily - 7 x weekly - 2 sets - 2 reps - 30 hold Supine Figure 4 Piriformis Stretch - 2 x daily - 7 x weekly - 2 sets - 2 reps - 30 hold

## 2021-11-28 NOTE — Therapy (Signed)
[facility] [address]  [place], [place], [address] Phone: [phone]   Fax:  [phone]  Physical Therapy Treatment  Patient Details  Name: Dean Robertson MRN: [id number] Date of Birth: [birth year] Referring Provider (PT): Lorine Bears   Encounter Date: 11/28/2021   PT End of Session - 11/28/21 1451     Visit Number 3    Number of Visits 12    Date for PT Re-Evaluation 12/20/21    Authorization Type [employer]    PT Start Time 5859    PT Stop Time 2924    PT Time Calculation (min) 45 min    Activity Tolerance Patient tolerated treatment well    Behavior During Therapy [facility] for tasks assessed/performed             Past Medical History:  Diagnosis Date   Anxiety    Arthritis    shoulders   Coronary artery disease    Diabetes mellitus without complication ([place])    H/O heart artery stent    x1   High cholesterol    Hypertension     Past Surgical History:  Procedure Laterality Date   COLONOSCOPY     2012- normal    CORONARY ANGIOPLASTY WITH STENT PLACEMENT     CORONARY STENT INTERVENTION N/A 12/23/2020   Procedure: CORONARY STENT INTERVENTION;  Surgeon: Sherren Mocha, MD;  Location: [place] [facility];  Service: Cardiovascular;  Laterality: N/A;   LEFT HEART CATH AND CORONARY ANGIOGRAPHY N/A 12/23/2020   Procedure: LEFT HEART CATH AND CORONARY ANGIOGRAPHY;  Surgeon: Charolette Forward, MD;  Location: [place] [facility];  Service: Cardiovascular;  Laterality: N/A;    There were no vitals filed for this visit.   Subjective Assessment - 11/28/21 1355     Subjective Pt notes sciatic pain mostly in his buttock, always moving around but when sitting that's when it's the worse.    Pertinent History Patient reports pain has been ongoing for months. His pain is exacerbated by laying down and moving from a sitting to standing position. Patient describes pain as sorenes  Patient had surgical  consultation with Dr. Basil Dess on 07/26/2021 and feels that patient is not a surgical candidate at this time, however he could benefit from facet joint blocks with possible radiofrequency ablation. Patient did have bilateral L2 transforaminal epidural steroid injection by Dr. Magnus Sinning on 02/23/2021 that did help to alleviate pain, however pain relief was short lived. Patient states his pain is not severe at this time, currently rates as 4 out of 10 and reports he would like to attend formal physical therapy before moving forward with more invasive procedures.    Diagnostic tests recent lumbar MRI exhibits lumbar spondylosis, short pedicles causing moderate impingement at L2-3 and L4-5, and mild impingement at L3-4 and L5-S1.    Patient Stated Goals "to get better"  "move better"    Currently in Pain? Yes    Pain Score 8     Pain Location Buttocks    Pain Orientation Right;Left    Pain Descriptors / Indicators Sore    Pain Type Chronic pain                               [facility] Adult PT Treatment/Exercise - 11/28/21 0001       Exercises   Exercises Lumbar;Knee/Hip      Lumbar Exercises: Stretches  Active Hamstring Stretch Right;Left;2 reps   15 sec   Active Hamstring Stretch Limitations hooklying with strap    Piriformis Stretch Right;Left;30 seconds    Piriformis Stretch Limitations supine KTOS    Figure 4 Stretch 1 rep;30 seconds;Supine;With overpressure      Lumbar Exercises: Aerobic   Nustep L4x51mn      Lumbar Exercises: Seated   Long Arc Quad on Chair Strengthening;Both;10 reps    LAQ on Chair Limitations with VMO ball squeeze    Other Seated Lumbar Exercises ball squeeze 10x3"    Other Seated Lumbar Exercises clamshell with GTB 10x      Knee/Hip Exercises: Standing   Other Standing Knee Exercises standing hip hikes 10x R/L      Knee/Hip Exercises: Seated   Hamstring Curl Strengthening;Both;10 reps    Hamstring Limitations GTB                      PT Education - 11/28/21 1451     Education Details HEP update    Person(s) Educated Patient    Methods Explanation;Demonstration;Handout    Comprehension Verbalized understanding;Returned demonstration              PT Short Term Goals - 11/14/21 0851       PT SHORT TERM GOAL #1   Title Pt. will be independent with initial HEP.    Time 2    Period Weeks    Status Achieved    Target Date 11/22/21               PT Long Term Goals - 11/28/21 1515       PT LONG TERM GOAL #1   Title Pt. will be compliant with progressed HEP to improve outcomes.    Time 6    Period Weeks    Status Partially Met   met for current   Target Date 12/20/21      PT LONG TERM GOAL #2   Title Pt. will be able to transition from standing to sitting without pain.    Baseline 10/10 buttock pain with transitions to sitting.    Time 6    Period Weeks    Status On-going    Target Date 12/20/21      PT LONG TERM GOAL #3   Title Pt. will report 75% improvement in back pain and radicular symptoms.    Time 6    Period Weeks    Status On-going    Target Date 12/20/21      PT LONG TERM GOAL #4   Title Pt. will be able to transition to gym based exercise program.    Baseline would like to join gym, stopped exercising because of pain    Time 6    Period Weeks    Status On-going    Target Date 12/20/21      PT LONG TERM GOAL #5   Title Pt. will improve score on FOTO to = or > 59% to demonstrate improve QOL.    Baseline 52%    Time 6    Period Weeks    Status On-going    Target Date 12/20/21                   Plan - 11/28/21 1452     Clinical Impression Statement Pt denied any questions or concerns with intial HEP, STG met. Added in piriformis stretch to HEP to address ongoing tightness and pain in glutes. Pt also continues  to have HS tightness. More weakness was noted on the L side with exercises. Close supervision required for stability with the standing hip  hikes and cues needed for proper movement pattern.    Personal Factors and Comorbidities Time since onset of injury/illness/exacerbation;Comorbidity 3+    Comorbidities T2DM with polyneuropathy, history MI, heart disease, chronic LBP    PT Frequency 2x / week    PT Duration 6 weeks    PT Treatment/Interventions ADLs/Self Care Home Management;Cryotherapy;Electrical Stimulation;Moist Heat;Traction;Ultrasound;Stair training;Functional mobility training;Therapeutic activities;Therapeutic exercise;Balance training;Gait training;Neuromuscular re-education;Passive range of motion;Dry needling;Manual techniques;Taping;Spinal Manipulations;Joint Manipulations    PT Next Visit Plan review and progress HEP as tolerated, consider dry needling to lumbar paraspinals, modalities and manual therapy PRN    PT Home Exercise Plan [place] and Agree with Plan of Care Patient             Patient will benefit from skilled therapeutic intervention in order to improve the following deficits and impairments:  Decreased activity tolerance, Decreased endurance, Decreased range of motion, Increased fascial restricitons, Impaired perceived functional ability, Improper body mechanics, Pain, Decreased mobility, Increased muscle spasms, Impaired flexibility, Postural dysfunction  Visit Diagnosis: Chronic bilateral low back pain with bilateral sciatica  Muscle spasm of back  Posture abnormality     Problem List Patient Active Problem List   Diagnosis Date Noted   Non-ST elevation (NSTEMI) myocardial infarction [facility])    Acute coronary syndrome ([place]) 12/22/2020   Tick bite 05/17/2020   Anxiety 02/05/2019   Essential hypertension 12/08/2017   Type 2 diabetes mellitus without complication, with long-term current use of insulin ([place]) 12/08/2017   Chest pain 10/04/2017   ASHD (arteriosclerotic heart disease) 11/15/2015   Hyperlipidemia 11/15/2015   Neuropathy 11/15/2015   Sprain/strain 11/27/2013     Artist Pais, PTA 11/28/2021, 3:15 PM  [facility] [address]  [place], [place], [address] Phone: [phone]   Fax:  [phone]  Name: Dean Robertson MRN: [id number] Date of Birth: 1961/01/27

## 2021-12-01 ENCOUNTER — Encounter: Payer: Medicare HMO | Admitting: Physical Therapy

## 2021-12-05 ENCOUNTER — Encounter: Payer: Medicare HMO | Admitting: Physical Therapy

## 2021-12-06 ENCOUNTER — Other Ambulatory Visit: Payer: Self-pay

## 2021-12-06 ENCOUNTER — Ambulatory Visit: Payer: Medicare HMO | Attending: Physical Medicine and Rehabilitation

## 2021-12-06 DIAGNOSIS — M5442 Lumbago with sciatica, left side: Secondary | ICD-10-CM | POA: Diagnosis not present

## 2021-12-06 DIAGNOSIS — R293 Abnormal posture: Secondary | ICD-10-CM | POA: Insufficient documentation

## 2021-12-06 DIAGNOSIS — M62830 Muscle spasm of back: Secondary | ICD-10-CM | POA: Diagnosis present

## 2021-12-06 DIAGNOSIS — M5441 Lumbago with sciatica, right side: Secondary | ICD-10-CM | POA: Insufficient documentation

## 2021-12-06 DIAGNOSIS — G8929 Other chronic pain: Secondary | ICD-10-CM | POA: Insufficient documentation

## 2021-12-06 NOTE — Therapy (Signed)
[facility] [address]  [place], [place], [address] Phone: [phone]   Fax:  [phone]  Physical Therapy Treatment  Patient Details  Name: Dean Robertson MRN: [id number] Date of Birth: [birth year] Referring Provider (PT): Lorine Bears   Encounter Date: 12/06/2021   PT End of Session - 12/06/21 1533     Visit Number 4    Number of Visits 12    Date for PT Re-Evaluation 12/20/21    Authorization Type [employer]    PT Start Time 1448    PT Stop Time 2542    PT Time Calculation (min) 42 min    Activity Tolerance Patient tolerated treatment well    Behavior During Therapy [facility] for tasks assessed/performed             Past Medical History:  Diagnosis Date   Anxiety    Arthritis    shoulders   Coronary artery disease    Diabetes mellitus without complication ([place])    H/O heart artery stent    x1   High cholesterol    Hypertension     Past Surgical History:  Procedure Laterality Date   COLONOSCOPY     2012- normal    CORONARY ANGIOPLASTY WITH STENT PLACEMENT     CORONARY STENT INTERVENTION N/A 12/23/2020   Procedure: CORONARY STENT INTERVENTION;  Surgeon: Sherren Mocha, MD;  Location: [place] [facility];  Service: Cardiovascular;  Laterality: N/A;   LEFT HEART CATH AND CORONARY ANGIOGRAPHY N/A 12/23/2020   Procedure: LEFT HEART CATH AND CORONARY ANGIOGRAPHY;  Surgeon: Charolette Forward, MD;  Location: [place] [facility];  Service: Cardiovascular;  Laterality: N/A;    There were no vitals filed for this visit.   Subjective Assessment - 12/06/21 1450     Subjective Had some pain on my side when I woke up. The back and hip are doing better.    Pertinent History Patient reports pain has been ongoing for months. His pain is exacerbated by laying down and moving from a sitting to standing position. Patient describes pain as sorenes  Patient had surgical consultation with Dr. Basil Dess on  07/26/2021 and feels that patient is not a surgical candidate at this time, however he could benefit from facet joint blocks with possible radiofrequency ablation. Patient did have bilateral L2 transforaminal epidural steroid injection by Dr. Magnus Sinning on 02/23/2021 that did help to alleviate pain, however pain relief was short lived. Patient states his pain is not severe at this time, currently rates as 4 out of 10 and reports he would like to attend formal physical therapy before moving forward with more invasive procedures.    Diagnostic tests recent lumbar MRI exhibits lumbar spondylosis, short pedicles causing moderate impingement at L2-3 and L4-5, and mild impingement at L3-4 and L5-S1.    Patient Stated Goals "to get better"  "move better"    Currently in Pain? No/denies                               [facility] Adult PT Treatment/Exercise - 12/06/21 0001       Lumbar Exercises: Stretches   Piriformis Stretch Right;Left;30 seconds;2 reps   1 set sitting, 1 set supine   Piriformis Stretch Limitations supine figure 4    Figure 4 Stretch 30 seconds    Figure 4 Stretch Limitations seated      Lumbar Exercises: Aerobic  Nustep L4x21mn      Lumbar Exercises: Machines for Strengthening   Other Lumbar Machine Exercise lat pulls 20lb 2x10      Lumbar Exercises: Standing   Other Standing Lumbar Exercises multifidi walkouts with blue TB, pallof press 10 reps each      Lumbar Exercises: Seated   Other Seated Lumbar Exercises DL with blue weight ball 15x      Lumbar Exercises: Supine   Bridge with Ball Squeeze 10 reps;2 seconds   2x10                      PT Short Term Goals - 11/14/21 0851       PT SHORT TERM GOAL #1   Title Pt. will be independent with initial HEP.    Time 2    Period Weeks    Status Achieved    Target Date 11/22/21               PT Long Term Goals - 12/06/21 1512       PT LONG TERM GOAL #1   Title Pt. will be  compliant with progressed HEP to improve outcomes.    Time 6    Period Weeks    Status Partially Met   met for current   Target Date 12/20/21      PT LONG TERM GOAL #2   Title Pt. will be able to transition from standing to sitting without pain.    Baseline 10/10 buttock pain with transitions to sitting.    Time 6    Period Weeks    Status Partially Met   2/1- no pain most of time when transitioning only certain times it shoots pain   Target Date 12/20/21      PT LONG TERM GOAL #3   Title Pt. will report 75% improvement in back pain and radicular symptoms.    Time 6    Period Weeks    Status On-going    Target Date 12/20/21      PT LONG TERM GOAL #4   Title Pt. will be able to transition to gym based exercise program.    Baseline would like to join gym, stopped exercising because of pain    Time 6    Period Weeks    Status On-going    Target Date 12/20/21      PT LONG TERM GOAL #5   Title Pt. will improve score on FOTO to = or > 59% to demonstrate improve QOL.    Baseline 52%    Time 6    Period Weeks    Status On-going    Target Date 12/20/21                   Plan - 12/06/21 1533     Clinical Impression Statement Pt reported that the last week has been really good for him, not much pain noted in the hip/LB lately. Exercises were tolerated well with cues given for proper technique and to prevent compensation. We progressed glute stretches to sitting to further improve sitting tolerance.  He is making good progress toward goals.    Personal Factors and Comorbidities Time since onset of injury/illness/exacerbation;Comorbidity 3+    Comorbidities T2DM with polyneuropathy, history MI, heart disease, chronic LBP    PT Frequency 2x / week    PT Duration 6 weeks    PT Treatment/Interventions ADLs/Self Care Home Management;Cryotherapy;Electrical Stimulation;Moist Heat;Traction;Ultrasound;Stair training;Functional mobility training;Therapeutic activities;Therapeutic  exercise;Balance training;Gait  training;Neuromuscular re-education;Passive range of motion;Dry needling;Manual techniques;Taping;Spinal Manipulations;Joint Manipulations    PT Next Visit Plan review and progress HEP as tolerated, consider dry needling to lumbar paraspinals, modalities and manual therapy PRN    PT Home Exercise Plan [place] and Agree with Plan of Care Patient             Patient will benefit from skilled therapeutic intervention in order to improve the following deficits and impairments:  Decreased activity tolerance, Decreased endurance, Decreased range of motion, Increased fascial restricitons, Impaired perceived functional ability, Improper body mechanics, Pain, Decreased mobility, Increased muscle spasms, Impaired flexibility, Postural dysfunction  Visit Diagnosis: Chronic bilateral low back pain with bilateral sciatica  Muscle spasm of back  Posture abnormality     Problem List Patient Active Problem List   Diagnosis Date Noted   Non-ST elevation (NSTEMI) myocardial infarction [facility])    Acute coronary syndrome ([place]) 12/22/2020   Tick bite 05/17/2020   Anxiety 02/05/2019   Essential hypertension 12/08/2017   Type 2 diabetes mellitus without complication, with long-term current use of insulin ([place]) 12/08/2017   Chest pain 10/04/2017   ASHD (arteriosclerotic heart disease) 11/15/2015   Hyperlipidemia 11/15/2015   Neuropathy 11/15/2015   Sprain/strain 11/27/2013    Artist Pais, PTA 12/06/2021, 3:35 PM  [facility] [address]  [place], [place], [address] Phone: [phone]   Fax:  [phone]  Name: Dean Robertson MRN: [id number] Date of Birth: Feb 27, 1961

## 2021-12-12 ENCOUNTER — Ambulatory Visit: Payer: Medicare HMO | Admitting: Physical Therapy

## 2021-12-12 ENCOUNTER — Encounter: Payer: Self-pay | Admitting: Physical Therapy

## 2021-12-12 ENCOUNTER — Other Ambulatory Visit: Payer: Self-pay

## 2021-12-12 DIAGNOSIS — M5441 Lumbago with sciatica, right side: Secondary | ICD-10-CM

## 2021-12-12 DIAGNOSIS — G8929 Other chronic pain: Secondary | ICD-10-CM

## 2021-12-12 DIAGNOSIS — R293 Abnormal posture: Secondary | ICD-10-CM

## 2021-12-12 DIAGNOSIS — M62830 Muscle spasm of back: Secondary | ICD-10-CM

## 2021-12-12 DIAGNOSIS — M5442 Lumbago with sciatica, left side: Secondary | ICD-10-CM | POA: Diagnosis not present

## 2021-12-12 NOTE — Therapy (Signed)
[facility] [address]  [place], [place], [address] Phone: [phone]   Fax:  [phone]  Physical Therapy Treatment Progress Note Reporting Period 11/14/2021 to 12/12/2021  See note below for Objective Data and Assessment of Progress/Goals.      Patient Details  Name: Dean Robertson MRN: [id number] Date of Birth: 25-Mar-1961 Referring Provider (PT): Lorine Bears   Encounter Date: 12/12/2021   PT End of Session - 12/12/21 1455     Visit Number 5    Number of Visits 12    Date for PT Re-Evaluation 12/20/21    Authorization Type [employer]    PT Start Time 3419    PT Stop Time 1529    PT Time Calculation (min) 38 min    Activity Tolerance Patient tolerated treatment well    Behavior During Therapy [facility] for tasks assessed/performed             Past Medical History:  Diagnosis Date   Anxiety    Arthritis    shoulders   Coronary artery disease    Diabetes mellitus without complication ([place])    H/O heart artery stent    x1   High cholesterol    Hypertension     Past Surgical History:  Procedure Laterality Date   COLONOSCOPY     2012- normal    CORONARY ANGIOPLASTY WITH STENT PLACEMENT     CORONARY STENT INTERVENTION N/A 12/23/2020   Procedure: CORONARY STENT INTERVENTION;  Surgeon: Sherren Mocha, MD;  Location: [place] [facility];  Service: Cardiovascular;  Laterality: N/A;   LEFT HEART CATH AND CORONARY ANGIOGRAPHY N/A 12/23/2020   Procedure: LEFT HEART CATH AND CORONARY ANGIOGRAPHY;  Surgeon: Charolette Forward, MD;  Location: [place] [facility];  Service: Cardiovascular;  Laterality: N/A;    There were no vitals filed for this visit.   Subjective Assessment - 12/12/21 1453     Subjective Pt. reports pain in back "off and on, feels like the muscles pulling.  I was hurting earlier in the day."  Worst first thing in morning, but improves as moves around.    Pertinent History Patient  reports pain has been ongoing for months. His pain is exacerbated by laying down and moving from a sitting to standing position. Patient describes pain as sorenes  Patient had surgical consultation with Dr. Basil Dess on 07/26/2021 and feels that patient is not a surgical candidate at this time, however he could benefit from facet joint blocks with possible radiofrequency ablation. Patient did have bilateral L2 transforaminal epidural steroid injection by Dr. Magnus Sinning on 02/23/2021 that did help to alleviate pain, however pain relief was short lived. Patient states his pain is not severe at this time, currently rates as 4 out of 10 and reports he would like to attend formal physical therapy before moving forward with more invasive procedures.    Diagnostic tests recent lumbar MRI exhibits lumbar spondylosis, short pedicles causing moderate impingement at L2-3 and L4-5, and mild impingement at L3-4 and L5-S1.    Patient Stated Goals "to get better"  "move better"    Currently in Pain? No/denies                [facility] PT Assessment - 12/12/21 0001       Observation/Other Assessments   Focus on Therapeutic Outcomes (FOTO)  lumbar 70%   59% predicted after 11 visits  [place] [facility] Adult PT Treatment/Exercise - 12/12/21 0001       Lumbar Exercises: Aerobic   Stationary Bike L3 x 6 min      Lumbar Exercises: Machines for Strengthening   Cybex Knee Extension 25# 1 x 15, 35# 1 x 15    Cybex Knee Flexion 35# 1 x 15, 45# 1 x 15    Leg Press 35# 1 x 15, 45# 1 x 15    Other Lumbar Machine Exercise lat pulls 20# 1 x 15, 25# 1 x 10    Other Lumbar Machine Exercise multifidi walkouts 15# x 10 bil                       PT Short Term Goals - 11/14/21 6073       PT SHORT TERM GOAL #1   Title Pt. will be independent with initial HEP.    Time 2    Period Weeks    Status Achieved    Target Date 11/22/21               PT Long Term Goals -  12/12/21 1457       PT LONG TERM GOAL #1   Title Pt. will be compliant with progressed HEP to improve outcomes.    Time 6    Period Weeks    Status Partially Met   12/12/21- met for current   Target Date 12/20/21      PT LONG TERM GOAL #2   Title Pt. will be able to transition from standing to sitting without pain.    Baseline 10/10 buttock pain with transitions to sitting.    Time 6    Period Weeks    Status Partially Met   12/12/21- no pain most of time when transitioning only certain times it shoots pain.  Hurt today getting into his truck 10/10   Target Date 12/20/21      PT LONG TERM GOAL #3   Title Pt. will report 75% improvement in back pain and radicular symptoms.    Time 6    Period Weeks    Status Achieved   12/12/21- 90% improvement overall   Target Date 12/20/21      PT LONG TERM GOAL #4   Title Pt. will be able to transition to gym based exercise program.    Baseline would like to join gym, stopped exercising because of pain    Time 6    Period Weeks    Status On-going    Target Date 12/20/21      PT LONG TERM GOAL #5   Title Pt. will improve score on FOTO to = or > 59% to demonstrate improve QOL.    Baseline 52%    Time 6    Period Weeks    Status On-going    Target Date 12/20/21                   Plan - 12/12/21 1544     Clinical Impression Statement Pt. is making good progress, reporting 90% overall improvement and his FOTO has improved to 78% which is above predicted outcome.  He still has pain primarily in the mornings, educated and reviewed HEP for LTR and sciatic nerve glides, he has not been performing nerve glides so will try these to see if helps.  He tolerated all exercises today without complaint of LBP.  He would benefit from continued skilled therapy with emphasis on transitioning to gym  based program.    Personal Factors and Comorbidities Time since onset of injury/illness/exacerbation;Comorbidity 3+    Comorbidities T2DM with  polyneuropathy, history MI, heart disease, chronic LBP    PT Frequency 2x / week    PT Duration 6 weeks    PT Treatment/Interventions ADLs/Self Care Home Management;Cryotherapy;Electrical Stimulation;Moist Heat;Traction;Ultrasound;Stair training;Functional mobility training;Therapeutic activities;Therapeutic exercise;Balance training;Gait training;Neuromuscular re-education;Passive range of motion;Dry needling;Manual techniques;Taping;Spinal Manipulations;Joint Manipulations    PT Next Visit Plan review and progress HEP as tolerated, consider dry needling to lumbar paraspinals, modalities and manual therapy PRN    PT Home Exercise Plan [place] and Agree with Plan of Care Patient             Patient will benefit from skilled therapeutic intervention in order to improve the following deficits and impairments:  Decreased activity tolerance, Decreased endurance, Decreased range of motion, Increased fascial restricitons, Impaired perceived functional ability, Improper body mechanics, Pain, Decreased mobility, Increased muscle spasms, Impaired flexibility, Postural dysfunction  Visit Diagnosis: Chronic bilateral low back pain with bilateral sciatica  Muscle spasm of back  Posture abnormality     Problem List Patient Active Problem List   Diagnosis Date Noted   Non-ST elevation (NSTEMI) myocardial infarction [facility])    Acute coronary syndrome ([place]) 12/22/2020   Tick bite 05/17/2020   Anxiety 02/05/2019   Essential hypertension 12/08/2017   Type 2 diabetes mellitus without complication, with long-term current use of insulin ([place]) 12/08/2017   Chest pain 10/04/2017   ASHD (arteriosclerotic heart disease) 11/15/2015   Hyperlipidemia 11/15/2015   Neuropathy 11/15/2015   Sprain/strain 11/27/2013    Rennie Natter, PT, DPT  12/12/2021, 3:54 PM  [facility] [address]  [place], [place], [address] Phone:  [phone]   Fax:  [phone]  Name: Dean Robertson MRN: [id number] Date of Birth: 02/06/1961

## 2021-12-15 ENCOUNTER — Ambulatory Visit: Payer: Medicare HMO | Admitting: Physical Therapy

## 2021-12-15 ENCOUNTER — Other Ambulatory Visit: Payer: Self-pay

## 2021-12-15 ENCOUNTER — Encounter: Payer: Medicare HMO | Admitting: Physical Therapy

## 2021-12-15 ENCOUNTER — Encounter: Payer: Self-pay | Admitting: Physical Therapy

## 2021-12-15 DIAGNOSIS — M5442 Lumbago with sciatica, left side: Secondary | ICD-10-CM | POA: Diagnosis not present

## 2021-12-15 DIAGNOSIS — G8929 Other chronic pain: Secondary | ICD-10-CM

## 2021-12-15 DIAGNOSIS — R293 Abnormal posture: Secondary | ICD-10-CM

## 2021-12-15 DIAGNOSIS — M62830 Muscle spasm of back: Secondary | ICD-10-CM

## 2021-12-15 NOTE — Therapy (Signed)
[facility] [address]  [place], [place], [address] Phone: [phone]   Fax:  [phone]  Physical Therapy Treatment  Patient Details  Name: Dean Robertson MRN: [id number] Date of Birth: January 14, 1961 Referring Provider (PT): Lorine Bears   Encounter Date: 12/15/2021   PT End of Session - 12/15/21 0759     Visit Number 6    Number of Visits 12    Date for PT Re-Evaluation 12/20/21    Authorization Type [employer]    PT Start Time 9622    PT Stop Time 0845    PT Time Calculation (min) 49 min    Activity Tolerance Patient tolerated treatment well    Behavior During Therapy [facility] for tasks assessed/performed             Past Medical History:  Diagnosis Date   Anxiety    Arthritis    shoulders   Coronary artery disease    Diabetes mellitus without complication ([place])    H/O heart artery stent    x1   High cholesterol    Hypertension     Past Surgical History:  Procedure Laterality Date   COLONOSCOPY     2012- normal    CORONARY ANGIOPLASTY WITH STENT PLACEMENT     CORONARY STENT INTERVENTION N/A 12/23/2020   Procedure: CORONARY STENT INTERVENTION;  Surgeon: Sherren Mocha, MD;  Location: [place] [facility];  Service: Cardiovascular;  Laterality: N/A;   LEFT HEART CATH AND CORONARY ANGIOGRAPHY N/A 12/23/2020   Procedure: LEFT HEART CATH AND CORONARY ANGIOGRAPHY;  Surgeon: Charolette Forward, MD;  Location: [place] [facility];  Service: Cardiovascular;  Laterality: N/A;    There were no vitals filed for this visit.   Subjective Assessment - 12/15/21 0758     Subjective Pt. reports doing well this morning but still gets a lot of pain when first wakes up, 9/10 but then improves.    Pertinent History Patient reports pain has been ongoing for months. His pain is exacerbated by laying down and moving from a sitting to standing position. Patient describes pain as sorenes  Patient had surgical  consultation with Dr. Basil Dess on 07/26/2021 and feels that patient is not a surgical candidate at this time, however he could benefit from facet joint blocks with possible radiofrequency ablation. Patient did have bilateral L2 transforaminal epidural steroid injection by Dr. Magnus Sinning on 02/23/2021 that did help to alleviate pain, however pain relief was short lived. Patient states his pain is not severe at this time, currently rates as 4 out of 10 and reports he would like to attend formal physical therapy before moving forward with more invasive procedures.    Diagnostic tests recent lumbar MRI exhibits lumbar spondylosis, short pedicles causing moderate impingement at L2-3 and L4-5, and mild impingement at L3-4 and L5-S1.    Patient Stated Goals "to get better"  "move better"    Currently in Pain? Yes    Pain Score 3     Pain Location Back    Pain Orientation Lower                               [facility] Adult PT Treatment/Exercise - 12/15/21 0001       Lumbar Exercises: Aerobic   Stationary Bike L3 x 6 min      Lumbar Exercises: Machines for Strengthening   Leg Press 55# 1 x  10, 1 x 20    Other Lumbar Machine Exercise lat pulls standing 35# 2 x 10, rows 35# 2 x 10      Manual Therapy   Manual Therapy Soft tissue mobilization;Other (comment)    Manual therapy comments to decrease spasm in low back    Soft tissue mobilization STM to lumbar paraspinals and glutes, bil    Other Manual Therapy skilled palpation and monitoring during dry needling.              Trigger Point Dry Needling - 12/15/21 0001     Consent Given? Yes    Education Handout Provided Yes    Muscles Treated Back/Hip Lumbar multifidi;Gluteus medius;Piriformis    Dry Needling Comments bilateral, L2-5    Gluteus Medius Response Twitch response elicited;Palpable increased muscle length    Piriformis Response Twitch response elicited;Palpable increased muscle length    Lumbar multifidi  Response Twitch response elicited;Palpable increased muscle length                   PT Education - 12/15/21 0931     Education Details education on risks and indications for dry needling.    Person(s) Educated Patient    Methods Explanation;Handout    Comprehension Verbalized understanding              PT Short Term Goals - 11/14/21 0851       PT SHORT TERM GOAL #1   Title Pt. will be independent with initial HEP.    Time 2    Period Weeks    Status Achieved    Target Date 11/22/21               PT Long Term Goals - 12/12/21 1457       PT LONG TERM GOAL #1   Title Pt. will be compliant with progressed HEP to improve outcomes.    Time 6    Period Weeks    Status Partially Met   12/12/21- met for current   Target Date 12/20/21      PT LONG TERM GOAL #2   Title Pt. will be able to transition from standing to sitting without pain.    Baseline 10/10 buttock pain with transitions to sitting.    Time 6    Period Weeks    Status Partially Met   12/12/21- no pain most of time when transitioning only certain times it shoots pain.  Hurt today getting into his truck 10/10   Target Date 12/20/21      PT LONG TERM GOAL #3   Title Pt. will report 75% improvement in back pain and radicular symptoms.    Time 6    Period Weeks    Status Achieved   12/12/21- 90% improvement overall   Target Date 12/20/21      PT LONG TERM GOAL #4   Title Pt. will be able to transition to gym based exercise program.    Baseline would like to join gym, stopped exercising because of pain    Time 6    Period Weeks    Status On-going    Target Date 12/20/21      PT LONG TERM GOAL #5   Title Pt. will improve score on FOTO to = or > 59% to demonstrate improve QOL.    Baseline 52%    Time 6    Period Weeks    Status On-going    Target Date 12/20/21  Plan - 12/15/21 0932     Clinical Impression Statement Pt. reports severe LBP still in mornings, and  intermittantly throughout the day.  He still demonstrates strong flexion preference with increased pain with extension.  He was able to tolerate progression of exercises today without LBP.  Today noted trigger points throughout lumbar paraspinals and glutes, after discussion and education about purpose of dry needling and risks, he consented to trial.  Afterwards he demonstrated decreased tightnes and was able to touch toes with much less muscle guarding.  He would benefit from skilled phyiscal therapy.    Personal Factors and Comorbidities Time since onset of injury/illness/exacerbation;Comorbidity 3+    Comorbidities T2DM with polyneuropathy, history MI, heart disease, chronic LBP    PT Frequency 2x / week    PT Duration 6 weeks    PT Treatment/Interventions ADLs/Self Care Home Management;Cryotherapy;Electrical Stimulation;Moist Heat;Traction;Ultrasound;Stair training;Functional mobility training;Therapeutic activities;Therapeutic exercise;Balance training;Gait training;Neuromuscular re-education;Passive range of motion;Dry needling;Manual techniques;Taping;Spinal Manipulations;Joint Manipulations    PT Next Visit Plan review and progress HEP as tolerated, consider dry needling to lumbar paraspinals, modalities and manual therapy PRN    PT Home Exercise Plan [place] and Agree with Plan of Care Patient             Patient will benefit from skilled therapeutic intervention in order to improve the following deficits and impairments:  Decreased activity tolerance, Decreased endurance, Decreased range of motion, Increased fascial restricitons, Impaired perceived functional ability, Improper body mechanics, Pain, Decreased mobility, Increased muscle spasms, Impaired flexibility, Postural dysfunction  Visit Diagnosis: Chronic bilateral low back pain with bilateral sciatica  Muscle spasm of back  Posture abnormality     Problem List Patient Active Problem List   Diagnosis Date  Noted   Non-ST elevation (NSTEMI) myocardial infarction [facility])    Acute coronary syndrome ([place]) 12/22/2020   Tick bite 05/17/2020   Anxiety 02/05/2019   Essential hypertension 12/08/2017   Type 2 diabetes mellitus without complication, with long-term current use of insulin ([place]) 12/08/2017   Chest pain 10/04/2017   ASHD (arteriosclerotic heart disease) 11/15/2015   Hyperlipidemia 11/15/2015   Neuropathy 11/15/2015   Sprain/strain 11/27/2013    Rennie Natter, PT, DPT  12/15/2021, 9:35 AM  [facility] [address]  [place], [place], [address] Phone: [phone]   Fax:  [phone]  Name: Dean Robertson MRN: [id number] Date of Birth: [birth year]

## 2021-12-15 NOTE — Patient Instructions (Signed)

## 2021-12-18 ENCOUNTER — Ambulatory Visit: Payer: Medicare HMO | Admitting: Physical Therapy

## 2021-12-19 ENCOUNTER — Encounter: Payer: Medicare HMO | Admitting: Physical Therapy

## 2022-01-02 ENCOUNTER — Other Ambulatory Visit: Payer: Self-pay

## 2022-01-03 ENCOUNTER — Other Ambulatory Visit: Payer: Self-pay

## 2022-01-03 ENCOUNTER — Encounter: Payer: Self-pay | Admitting: Physical Therapy

## 2022-01-03 ENCOUNTER — Ambulatory Visit: Payer: Medicare Other | Attending: Physical Medicine and Rehabilitation | Admitting: Physical Therapy

## 2022-01-03 DIAGNOSIS — G8929 Other chronic pain: Secondary | ICD-10-CM | POA: Insufficient documentation

## 2022-01-03 DIAGNOSIS — R293 Abnormal posture: Secondary | ICD-10-CM | POA: Diagnosis not present

## 2022-01-03 DIAGNOSIS — M62830 Muscle spasm of back: Secondary | ICD-10-CM | POA: Diagnosis not present

## 2022-01-03 DIAGNOSIS — M5442 Lumbago with sciatica, left side: Secondary | ICD-10-CM | POA: Diagnosis not present

## 2022-01-03 DIAGNOSIS — M5441 Lumbago with sciatica, right side: Secondary | ICD-10-CM | POA: Diagnosis not present

## 2022-01-03 MED ORDER — GLIPIZIDE 5 MG PO TABS: 5.0000 mg | ORAL_TABLET | Freq: Two times a day (BID) | ORAL | 0 refills | Status: DC

## 2022-01-03 NOTE — Therapy (Signed)
[facility] [address]  [place], [place], [address] Phone: [phone]   Fax:  [phone]  Physical Therapy Treatment Progress Note Reporting Period 11/08/2021 to 01/03/2022  See note below for Objective Data and Assessment of Progress/Goals.      Patient Details  Name: Russel Morain MRN: [id number] Date of Birth: 1961/04/04 Referring Provider (PT): Lorine Bears   Encounter Date: 01/03/2022   PT End of Session - 01/03/22 0803     Visit Number 7    Number of Visits 15    Date for PT Re-Evaluation 01/31/22    Authorization Type [employer]    Progress Note Due on Visit 15    PT Start Time 0803    PT Stop Time 0842    PT Time Calculation (min) 39 min    Activity Tolerance Patient tolerated treatment well    Behavior During Therapy [facility] for tasks assessed/performed             Past Medical History:  Diagnosis Date   Anxiety    Arthritis    shoulders   Coronary artery disease    Diabetes mellitus without complication ([place])    H/O heart artery stent    x1   High cholesterol    Hypertension     Past Surgical History:  Procedure Laterality Date   COLONOSCOPY     2012- normal    CORONARY ANGIOPLASTY WITH STENT PLACEMENT     CORONARY STENT INTERVENTION N/A 12/23/2020   Procedure: CORONARY STENT INTERVENTION;  Surgeon: Sherren Mocha, MD;  Location: [place] [facility];  Service: Cardiovascular;  Laterality: N/A;   LEFT HEART CATH AND CORONARY ANGIOGRAPHY N/A 12/23/2020   Procedure: LEFT HEART CATH AND CORONARY ANGIOGRAPHY;  Surgeon: Charolette Forward, MD;  Location: [place] [facility];  Service: Cardiovascular;  Laterality: N/A;    There were no vitals filed for this visit.   Subjective Assessment - 01/03/22 0804     Subjective Pt. reports he's had a couple of good days.  But back has been so bad sometimes.  Last night was really bad.  Still those transitional movements that are painful.     Pertinent History Patient reports pain has been ongoing for months. His pain is exacerbated by laying down and moving from a sitting to standing position. Patient describes pain as sorenes  Patient had surgical consultation with Dr. Basil Dess on 07/26/2021 and feels that patient is not a surgical candidate at this time, however he could benefit from facet joint blocks with possible radiofrequency ablation. Patient did have bilateral L2 transforaminal epidural steroid injection by Dr. Magnus Sinning on 02/23/2021 that did help to alleviate pain, however pain relief was short lived. Patient states his pain is not severe at this time, currently rates as 4 out of 10 and reports he would like to attend formal physical therapy before moving forward with more invasive procedures.    Diagnostic tests recent lumbar MRI exhibits lumbar spondylosis, short pedicles causing moderate impingement at L2-3 and L4-5, and mild impingement at L3-4 and L5-S1.    Patient Stated Goals "to get better"  "move better"    Currently in Pain? No/denies                [facility] PT Assessment - 01/03/22 0001       Assessment   Medical Diagnosis M54.50,G89.29 (ICD-10-CM) - Chronic bilateral low back pain without sciatica    Referring Provider (PT) Jimmye Norman,  Megan E      Precautions   Precautions None      Restrictions   Weight Bearing Restrictions No                           [facility] Adult PT Treatment/Exercise - 01/03/22 0001       Therapeutic Activites    Therapeutic Activities Other Therapeutic Activities    Other Therapeutic Activities transitional movements - sit to stands and log roll, with cues how to avoid extending lumbar spine and causing pain.  Practice and VC needed.      Lumbar Exercises: Stretches   Prone on Elbows Stretch 1 rep    Prone on Elbows Stretch Limitations able to tolerate prone x 2 min, but when transitioned to POE increased pain significantly R LB so returned to prone.       Lumbar Exercises: Aerobic   Nustep L6 x 6 min      Lumbar Exercises: Prone   Single Arm Raise 20 reps    Single Arm Raises Limitations increased tightness R side LB but no pain    Straight Leg Raise 10 reps    Straight Leg Raises Limitations L side only to avoid aggravation R LBP      Manual Therapy   Manual Therapy Soft tissue mobilization;Myofascial release;Joint mobilization    Manual therapy comments to decrease spasm in low back    Joint Mobilization R UPA mobs L2-5 grade 3-4    Soft tissue mobilization IASTM to with foam roller to R sided lumbar paraspinals and glutes    Myofascial Release TPR R lumbar multifidi                       PT Short Term Goals - 11/14/21 0851       PT SHORT TERM GOAL #1   Title Pt. will be independent with initial HEP.    Time 2    Period Weeks    Status Achieved    Target Date 11/22/21               PT Long Term Goals - 01/03/22 0809       PT LONG TERM GOAL #1   Title Pt. will be compliant with progressed HEP to improve outcomes.    Time 6    Period Weeks    Status Partially Met   12/12/21- met for current   Target Date 12/20/21      PT LONG TERM GOAL #2   Title Pt. will be able to transition from standing to sitting without pain.    Baseline 10/10 buttock pain with transitions to sitting.    Time 4    Period Weeks    Status Partially Met   12/12/21- no pain most of time when transitioning only certain times it shoots pain.  Hurt today getting into his truck 10/10 01/03/22- still severe pain with transitional movements.   Target Date 01/31/22      PT LONG TERM GOAL #3   Title Pt. will report 75% improvement in back pain and radicular symptoms.    Time 4    Period Weeks    Status Achieved   12/12/21- 90% improvement overall   Target Date 01/31/22      PT LONG TERM GOAL #4   Title Pt. will be able to transition to gym based exercise program.    Baseline would like to join gym, stopped exercising  because of pain     Time 4    Period Weeks    Status On-going    Target Date 01/31/22      PT LONG TERM GOAL #5   Title Pt. will improve score on FOTO to = or > 59% to demonstrate improve QOL.    Baseline 52%    Time 6    Period Weeks    Status Achieved   12/12/21- 70%   Target Date 12/20/21                   Plan - 01/03/22 1032     Clinical Impression Statement Focused today on transitional movements, pt. tends to lift both legs to get in/out of bed and reposition, which immediately increases back pain.  Needed repeated practice of log rolls and working on extending one leg at at time to stop this movement pattern.  When done correctly reported no back pain.  Also worked on proper form with sit to stands, able to perform without pain.  Noted tightness with prone lying, was unable to tolerate POE today due to sharp increased in R side LBP.  He would benefit from continued skilled therapy 2x/week for 4 additional weeks to continue to improve LBP and quality of life, as he continues to have intermittant severe R sided low back pain, especially with lumbar extension.  We did discuss importance of regular attendance to improve outcomes and no/show cancellation policy, and he verbalized understanding.    Personal Factors and Comorbidities Time since onset of injury/illness/exacerbation;Comorbidity 3+    Comorbidities T2DM with polyneuropathy, history MI, heart disease, chronic LBP    PT Frequency 2x / week    PT Duration 6 weeks    PT Treatment/Interventions ADLs/Self Care Home Management;Cryotherapy;Electrical Stimulation;Moist Heat;Traction;Ultrasound;Stair training;Functional mobility training;Therapeutic activities;Therapeutic exercise;Balance training;Gait training;Neuromuscular re-education;Passive range of motion;Dry needling;Manual techniques;Taping;Spinal Manipulations;Joint Manipulations    PT Next Visit Plan review and progress HEP as tolerated, consider dry needling to lumbar paraspinals,  modalities and manual therapy PRN    PT Home Exercise Plan [place] and Agree with Plan of Care Patient             Patient will benefit from skilled therapeutic intervention in order to improve the following deficits and impairments:  Decreased activity tolerance, Decreased endurance, Decreased range of motion, Increased fascial restricitons, Impaired perceived functional ability, Improper body mechanics, Pain, Decreased mobility, Increased muscle spasms, Impaired flexibility, Postural dysfunction  Visit Diagnosis: Chronic bilateral low back pain with bilateral sciatica  Muscle spasm of back  Posture abnormality     Problem List Patient Active Problem List   Diagnosis Date Noted   Non-ST elevation (NSTEMI) myocardial infarction [facility])    Acute coronary syndrome ([place]) 12/22/2020   Tick bite 05/17/2020   Anxiety 02/05/2019   Essential hypertension 12/08/2017   Type 2 diabetes mellitus without complication, with long-term current use of insulin ([place]) 12/08/2017   Chest pain 10/04/2017   ASHD (arteriosclerotic heart disease) 11/15/2015   Hyperlipidemia 11/15/2015   Neuropathy 11/15/2015   Sprain/strain 11/27/2013    Rennie Natter, PT, DPT  01/03/2022, 10:37 AM  [facility] [address]  [place], [place], [address] Phone: [phone]   Fax:  [phone]  Name: Luby Seamans MRN: [id number] Date of Birth: 09/23/1961

## 2022-01-04 DIAGNOSIS — H4010X0 Unspecified open-angle glaucoma, stage unspecified: Secondary | ICD-10-CM | POA: Diagnosis not present

## 2022-01-05 ENCOUNTER — Other Ambulatory Visit: Payer: Self-pay

## 2022-01-05 ENCOUNTER — Ambulatory Visit: Payer: Medicare Other

## 2022-01-05 DIAGNOSIS — M62830 Muscle spasm of back: Secondary | ICD-10-CM

## 2022-01-05 DIAGNOSIS — G8929 Other chronic pain: Secondary | ICD-10-CM | POA: Diagnosis not present

## 2022-01-05 DIAGNOSIS — R293 Abnormal posture: Secondary | ICD-10-CM | POA: Diagnosis not present

## 2022-01-05 DIAGNOSIS — M5442 Lumbago with sciatica, left side: Secondary | ICD-10-CM | POA: Diagnosis not present

## 2022-01-05 DIAGNOSIS — M5441 Lumbago with sciatica, right side: Secondary | ICD-10-CM | POA: Diagnosis not present

## 2022-01-05 NOTE — Patient Instructions (Signed)
Access Code: [email or web address] ?URL: [email or web address] ?Date: 01/05/2022 ?Prepared by: Clarene Essex ? ?Exercises ?Prone Press Up - 1 x daily - 7 x weekly - 2 sets - 10 reps ?Supine Lower Trunk Rotation - 1 x daily - 7 x weekly - 2 sets - 10 reps ?Supine Bridge with Resistance Band - 1 x daily - 7 x weekly - 2 sets - 10 reps ?Clamshell with Resistance - 1 x daily - 7 x weekly - 2 sets - 10 reps ?Supine 90/90 Sciatic Nerve Glide with Knee Flexion/Extension - 1 x daily - 7 x weekly - 2 sets - 10 reps ?Standing Lumbar Extension at Wall - Forearms - 1 x daily - 7 x weekly - 1 sets - 10 reps ?Supine Figure 4 Piriformis Stretch - 2 x daily - 7 x weekly - 2 sets - 2 reps - 30 hold ?Beginner Front Arm Support - 1 x daily - 7 x weekly - 3 sets - 10 reps ?Quadruped Hip Extension Kicks - 1 x daily - 3-4 x weekly - 3 sets - 10 reps ?Standard Plank - 1 x daily - 3-4 x weekly - 3 sets - 3 reps - 15-20 second hold ? ?

## 2022-01-05 NOTE — Therapy (Signed)
[facility] [address]  [place], [place], [address] Phone: [phone]   Fax:  [phone]  Physical Therapy Treatment  Patient Details  Name: Dean Robertson MRN: [id number] Date of Birth: 1961-01-05 Referring Provider (PT): Lorine Bears   Encounter Date: 01/05/2022   PT End of Session - 01/05/22 0901     Visit Number 8    Number of Visits 15    Date for PT Re-Evaluation 01/31/22    Authorization Type [employer]    Progress Note Due on Visit 15    PT Start Time 0801    PT Stop Time 2703    PT Time Calculation (min) 48 min    Activity Tolerance Patient tolerated treatment well    Behavior During Therapy [facility] for tasks assessed/performed             Past Medical History:  Diagnosis Date   Anxiety    Arthritis    shoulders   Coronary artery disease    Diabetes mellitus without complication ([place])    H/O heart artery stent    x1   High cholesterol    Hypertension     Past Surgical History:  Procedure Laterality Date   COLONOSCOPY     2012- normal    CORONARY ANGIOPLASTY WITH STENT PLACEMENT     CORONARY STENT INTERVENTION N/A 12/23/2020   Procedure: CORONARY STENT INTERVENTION;  Surgeon: Sherren Mocha, MD;  Location: [place] [facility];  Service: Cardiovascular;  Laterality: N/A;   LEFT HEART CATH AND CORONARY ANGIOGRAPHY N/A 12/23/2020   Procedure: LEFT HEART CATH AND CORONARY ANGIOGRAPHY;  Surgeon: Charolette Forward, MD;  Location: [place] [facility];  Service: Cardiovascular;  Laterality: N/A;    There were no vitals filed for this visit.   Subjective Assessment - 01/05/22 0805     Subjective "Yesterday I had a little pain early in the morning but I rested and that made it go away."    Pertinent History Patient reports pain has been ongoing for months. His pain is exacerbated by laying down and moving from a sitting to standing position. Patient describes pain as sorenes  Patient had  surgical consultation with Dr. Basil Dess on 07/26/2021 and feels that patient is not a surgical candidate at this time, however he could benefit from facet joint blocks with possible radiofrequency ablation. Patient did have bilateral L2 transforaminal epidural steroid injection by Dr. Magnus Sinning on 02/23/2021 that did help to alleviate pain, however pain relief was short lived. Patient states his pain is not severe at this time, currently rates as 4 out of 10 and reports he would like to attend formal physical therapy before moving forward with more invasive procedures.    Diagnostic tests recent lumbar MRI exhibits lumbar spondylosis, short pedicles causing moderate impingement at L2-3 and L4-5, and mild impingement at L3-4 and L5-S1.    Patient Stated Goals "to get better"  "move better"    Currently in Pain? No/denies                               [facility] Adult PT Treatment/Exercise - 01/05/22 0001       Lumbar Exercises: Aerobic   Recumbent Bike L4x10mn      Lumbar Exercises: Machines for Strengthening   Other Lumbar Machine Exercise standing row 20lb 2x10   cues to avoid compensating with biceps and shrugging shoulders  Other Lumbar Machine Exercise B pallof press 10lb x 10      Lumbar Exercises: Standing   Functional Squats 10 reps    Functional Squats Limitations with OHP and blue weighted ball    Other Standing Lumbar Exercises farmer carries around clinic x 4 round trips around the clinic 10lb DB each arm      Lumbar Exercises: Supine   Bridge 10 reps    Bridge Limitations with isometric hip ABD with GTB      Lumbar Exercises: Sidelying   Clam Right;Left;10 reps;3 seconds    Clam Limitations GTB      Lumbar Exercises: Quadruped   Straight Leg Raise 10 reps;3 seconds   cues not to rotate pelvis   Straight Leg Raises Limitations glute kicks    Plank x 20 seconds                     PT Education - 01/05/22 0900     Education Details HEP  update and review    Person(s) Educated Patient    Methods Explanation;Demonstration;Handout    Comprehension Verbalized understanding;Returned demonstration              PT Short Term Goals - 11/14/21 0851       PT SHORT TERM GOAL #1   Title Pt. will be independent with initial HEP.    Time 2    Period Weeks    Status Achieved    Target Date 11/22/21               PT Long Term Goals - 01/03/22 0809       PT LONG TERM GOAL #1   Title Pt. will be compliant with progressed HEP to improve outcomes.    Time 6    Period Weeks    Status Partially Met   12/12/21- met for current   Target Date 12/20/21      PT LONG TERM GOAL #2   Title Pt. will be able to transition from standing to sitting without pain.    Baseline 10/10 buttock pain with transitions to sitting.    Time 4    Period Weeks    Status Partially Met   12/12/21- no pain most of time when transitioning only certain times it shoots pain.  Hurt today getting into his truck 10/10 01/03/22- still severe pain with transitional movements.   Target Date 01/31/22      PT LONG TERM GOAL #3   Title Pt. will report 75% improvement in back pain and radicular symptoms.    Time 4    Period Weeks    Status Achieved   12/12/21- 90% improvement overall   Target Date 01/31/22      PT LONG TERM GOAL #4   Title Pt. will be able to transition to gym based exercise program.    Baseline would like to join gym, stopped exercising because of pain    Time 4    Period Weeks    Status On-going    Target Date 01/31/22      PT LONG TERM GOAL #5   Title Pt. will improve score on FOTO to = or > 59% to demonstrate improve QOL.    Baseline 52%    Time 6    Period Weeks    Status Achieved   12/12/21- 70%   Target Date 12/20/21  Plan - 01/05/22 0902     Clinical Impression Statement Pt responded well to the treatment today. Progressed extension exercises in quadruped, cues needed to keep nuetral spine and  pelvis with glute kicks. Cues with standing rows needed to avoid shrugging shoulders and compensating with biceps. The R lower body showed to remain weaker than the L today. I did update his HEP with quadruped glute kicks and planks. He did report that some exercises he just doesn't do at home, so I replaced those exercises.    Personal Factors and Comorbidities Time since onset of injury/illness/exacerbation;Comorbidity 3+    Comorbidities T2DM with polyneuropathy, history MI, heart disease, chronic LBP    PT Frequency 2x / week    PT Duration 6 weeks    PT Treatment/Interventions ADLs/Self Care Home Management;Cryotherapy;Electrical Stimulation;Moist Heat;Traction;Ultrasound;Stair training;Functional mobility training;Therapeutic activities;Therapeutic exercise;Balance training;Gait training;Neuromuscular re-education;Passive range of motion;Dry needling;Manual techniques;Taping;Spinal Manipulations;Joint Manipulations    PT Next Visit Plan keep progressing core strengthening and hip strengthening; consider dry needling to lumbar paraspinals, modalities and manual therapy PRN    PT Home Exercise Plan [place] and Agree with Plan of Care Patient             Patient will benefit from skilled therapeutic intervention in order to improve the following deficits and impairments:  Decreased activity tolerance, Decreased endurance, Decreased range of motion, Increased fascial restricitons, Impaired perceived functional ability, Improper body mechanics, Pain, Decreased mobility, Increased muscle spasms, Impaired flexibility, Postural dysfunction  Visit Diagnosis: Chronic bilateral low back pain with bilateral sciatica  Muscle spasm of back  Posture abnormality     Problem List Patient Active Problem List   Diagnosis Date Noted   Non-ST elevation (NSTEMI) myocardial infarction [facility])    Acute coronary syndrome ([place]) 12/22/2020   Tick bite 05/17/2020   Anxiety 02/05/2019    Essential hypertension 12/08/2017   Type 2 diabetes mellitus without complication, with long-term current use of insulin ([place]) 12/08/2017   Chest pain 10/04/2017   ASHD (arteriosclerotic heart disease) 11/15/2015   Hyperlipidemia 11/15/2015   Neuropathy 11/15/2015   Sprain/strain 11/27/2013    Artist Pais, PTA 01/05/2022, 9:09 AM  [facility] [address]  [place], [place], [address] Phone: [phone]   Fax:  [phone]  Name: Dean Robertson MRN: [id number] Date of Birth: [birth year]

## 2022-01-09 ENCOUNTER — Ambulatory Visit: Payer: Medicare Other

## 2022-01-11 ENCOUNTER — Ambulatory Visit: Payer: Medicare Other

## 2022-01-12 ENCOUNTER — Ambulatory Visit: Payer: Medicare Other | Admitting: Physical Therapy

## 2022-01-15 ENCOUNTER — Other Ambulatory Visit: Payer: Self-pay | Admitting: Nurse Practitioner

## 2022-01-17 ENCOUNTER — Ambulatory Visit: Payer: Medicare Other

## 2022-01-17 ENCOUNTER — Other Ambulatory Visit: Payer: Self-pay

## 2022-01-17 DIAGNOSIS — M5441 Lumbago with sciatica, right side: Secondary | ICD-10-CM | POA: Diagnosis not present

## 2022-01-17 DIAGNOSIS — M62830 Muscle spasm of back: Secondary | ICD-10-CM | POA: Diagnosis not present

## 2022-01-17 DIAGNOSIS — R293 Abnormal posture: Secondary | ICD-10-CM | POA: Diagnosis not present

## 2022-01-17 DIAGNOSIS — G8929 Other chronic pain: Secondary | ICD-10-CM

## 2022-01-17 DIAGNOSIS — M5442 Lumbago with sciatica, left side: Secondary | ICD-10-CM | POA: Diagnosis not present

## 2022-01-17 NOTE — Therapy (Signed)
[place] [facility] ?[place] [place] ?[place], [place], [address] ?Phone: [phone]   Fax:  [phone] ? ?Physical Therapy Treatment ? ?Patient Details  ?Name: Dean Robertson ?MRN: [id number] ?Date of Birth: August 18, 1961 ?Referring Provider (PT): Barnet Pall E ? ? ?Encounter Date: 01/17/2022 ? ? PT End of Session - 01/17/22 0951   ? ? Visit Number 9   ? Number of Visits 15   ? Date for PT Re-Evaluation 01/31/22   ? Authorization Type [employer]   ? Progress Note Due on Visit 15   ? PT Start Time 0848   ? PT Stop Time 0934   ? PT Time Calculation (min) 46 min   ? Activity Tolerance Patient tolerated treatment well   ? Behavior During Therapy [facility] for tasks assessed/performed   ? ?  ?  ? ?  ? ? ?Past Medical History:  ?Diagnosis Date  ? Anxiety   ? Arthritis   ? shoulders  ? Coronary artery disease   ? Diabetes mellitus without complication ([place])   ? H/O heart artery stent   ? x1  ? High cholesterol   ? Hypertension   ? ? ?Past Surgical History:  ?Procedure Laterality Date  ? COLONOSCOPY    ? 2012- normal   ? CORONARY ANGIOPLASTY WITH STENT PLACEMENT    ? CORONARY STENT INTERVENTION N/A 12/23/2020  ? Procedure: CORONARY STENT INTERVENTION;  Surgeon: Sherren Mocha, MD;  Location: [facility];  Service: Cardiovascular;  Laterality: N/A;  ? LEFT HEART CATH AND CORONARY ANGIOGRAPHY N/A 12/23/2020  ? Procedure: LEFT HEART CATH AND CORONARY ANGIOGRAPHY;  Surgeon: Charolette Forward, MD;  Location: [facility];  Service: Cardiovascular;  Laterality: N/A;  ? ? ?There were no vitals filed for this visit. ? ? Subjective Assessment - 01/17/22 0851   ? ? Subjective Been doing well lately.   ? Pertinent History Patient reports pain has been ongoing for months. His pain is exacerbated by laying down and moving from a sitting to standing position. Patient describes pain as sorenes  Patient had surgical consultation with Dr. Basil Dess on 07/26/2021 and feels that  patient is not a surgical candidate at this time, however he could benefit from facet joint blocks with possible radiofrequency ablation. Patient did have bilateral L2 transforaminal epidural steroid injection by Dr. Magnus Sinning on 02/23/2021 that did help to alleviate pain, however pain relief was short lived. Patient states his pain is not severe at this time, currently rates as 4 out of 10 and reports he would like to attend formal physical therapy before moving forward with more invasive procedures.   ? Diagnostic tests recent lumbar MRI exhibits lumbar spondylosis, short pedicles causing moderate impingement at L2-3 and L4-5, and mild impingement at L3-4 and L5-S1.   ? Patient Stated Goals "to get better"  "move better"   ? Currently in Pain? No/denies   ? ?  ?  ? ?  ? ? ? ? ? ? ? ? ? ? ? ? ? ? ? ? ? ? ? ? [place] Adult PT Treatment/Exercise - 01/17/22 0001   ? ?  ? Lumbar Exercises: Aerobic  ? Recumbent Bike [id number]   ?  ? Lumbar Exercises: Machines for Strengthening  ? Leg Press 35lb 2x10   ? Other Lumbar Machine Exercise standing row 20lb, 25lb x 10 each   ?  ? Lumbar Exercises: Standing  ? Other Standing Lumbar Exercises deadlift with 7lb  DB 2x10; standing shoulder press with staggered stance 7lb x 10   ?  ? Lumbar Exercises: Quadruped  ? Opposite Arm/Leg Raise Right arm/Left leg;Left arm/Right leg;10 reps;3 seconds   ? Plank x 20 seconds   ?  ? Shoulder Exercises: ROM/Strengthening  ? Lat Pull Limitations standing extension 20lb 2x10   ? ?  ?  ? ?  ? ? ? ? ? ? ? ? ? ? ? ? PT Short Term Goals - 11/14/21 0851   ? ?  ? PT SHORT TERM GOAL #1  ? Title Pt. will be independent with initial HEP.   ? Time 2   ? Period Weeks   ? Status Achieved   ? Target Date 11/22/21   ? ?  ?  ? ?  ? ? ? ? PT Long Term Goals - 01/17/22 0958   ? ?  ? PT LONG TERM GOAL #1  ? Title Pt. will be compliant with progressed HEP to improve outcomes.   ? Time 6   ? Period Weeks   ? Status Partially Met   12/12/21- met for current  ? Target  Date 12/20/21   ?  ? PT LONG TERM GOAL #2  ? Title Pt. will be able to transition from standing to sitting without pain.   ? Baseline 10/10 buttock pain with transitions to sitting.   ? Time 4   ? Period Weeks   ? Status Partially Met   01/17/22- occasional shooting pains when going from standing to sitting  ? Target Date 01/31/22   ?  ? PT LONG TERM GOAL #3  ? Title Pt. will report 75% improvement in back pain and radicular symptoms.   ? Time 4   ? Period Weeks   ? Status Achieved   12/12/21- 90% improvement overall  ? Target Date 01/31/22   ?  ? PT LONG TERM GOAL #4  ? Title Pt. will be able to transition to gym based exercise program.   ? Baseline would like to join gym, stopped exercising because of pain   ? Time 4   ? Period Weeks   ? Status On-going   ? Target Date 01/31/22   ?  ? PT LONG TERM GOAL #5  ? Title Pt. will improve score on FOTO to = or > 59% to demonstrate improve QOL.   ? Baseline 52%   ? Time 6   ? Period Weeks   ? Status Achieved   12/12/21- 70%  ? Target Date 12/20/21   ? ?  ?  ? ?  ? ? ? ? ? ? ? ? Plan - 01/17/22 1001   ? ? Clinical Impression Statement Pt tolerated the progression of exercises well. More challenged with bird dog and planks. Encouraged pt to look into gym membership to allow for healthy lifestyle outside of PT and we continue to focus on gym related exercises to allow for transition. Pt still having a ocassional pain with standing to sitting but with seated pirirformis stretch pain subsides.   ? Personal Factors and Comorbidities Time since onset of injury/illness/exacerbation;Comorbidity 3+   ? Comorbidities T2DM with polyneuropathy, history MI, heart disease, chronic LBP   ? PT Frequency 2x / week   ? PT Duration 6 weeks   ? PT Treatment/Interventions ADLs/Self Care Home Management;Cryotherapy;Electrical Stimulation;Moist Heat;Traction;Ultrasound;Stair training;Functional mobility training;Therapeutic activities;Therapeutic exercise;Balance training;Gait training;Neuromuscular  re-education;Passive range of motion;Dry needling;Manual techniques;Taping;Spinal Manipulations;Joint Manipulations   ? PT Next Visit Plan keep progressing core  strengthening and hip strengthening; consider dry needling to lumbar paraspinals, modalities and manual therapy PRN   ? PT Home Exercise Plan [place]   ? Consulted and Agree with Plan of Care Patient   ? ?  ?  ? ?  ? ? ?Patient will benefit from skilled therapeutic intervention in order to improve the following deficits and impairments:  Decreased activity tolerance, Decreased endurance, Decreased range of motion, Increased fascial restricitons, Impaired perceived functional ability, Improper body mechanics, Pain, Decreased mobility, Increased muscle spasms, Impaired flexibility, Postural dysfunction ? ?Visit Diagnosis: ?Chronic bilateral low back pain with bilateral sciatica ? ?Muscle spasm of back ? ?Posture abnormality ? ? ? ? ?Problem List ?Patient Active Problem List  ? Diagnosis Date Noted  ? Non-ST elevation (NSTEMI) myocardial infarction [facility])   ? Acute coronary syndrome ([place]) 12/22/2020  ? Tick bite 05/17/2020  ? Anxiety 02/05/2019  ? Essential hypertension 12/08/2017  ? Type 2 diabetes mellitus without complication, with long-term current use of insulin ([place]) 12/08/2017  ? Chest pain 10/04/2017  ? ASHD (arteriosclerotic heart disease) 11/15/2015  ? Hyperlipidemia 11/15/2015  ? Neuropathy 11/15/2015  ? Sprain/strain 11/27/2013  ? ? ?Artist Pais, PTA ?01/17/2022, 10:06 AM ? ?[place] [facility] ?[address] ?[place], [place], [address] ?Phone: [phone]   Fax:  [phone] ? ?Name: Dean Robertson ?MRN: [id number] ?Date of Birth: 07-29-1961 ? ? ? ?

## 2022-01-23 ENCOUNTER — Encounter: Payer: Medicare HMO | Admitting: Physical Therapy

## 2022-01-24 ENCOUNTER — Encounter: Payer: Self-pay | Admitting: Physical Therapy

## 2022-01-24 ENCOUNTER — Ambulatory Visit: Payer: Medicare Other | Admitting: Physical Therapy

## 2022-01-24 ENCOUNTER — Other Ambulatory Visit: Payer: Self-pay

## 2022-01-24 DIAGNOSIS — M62830 Muscle spasm of back: Secondary | ICD-10-CM

## 2022-01-24 DIAGNOSIS — M5441 Lumbago with sciatica, right side: Secondary | ICD-10-CM | POA: Diagnosis not present

## 2022-01-24 DIAGNOSIS — M5442 Lumbago with sciatica, left side: Secondary | ICD-10-CM | POA: Diagnosis not present

## 2022-01-24 DIAGNOSIS — R293 Abnormal posture: Secondary | ICD-10-CM | POA: Diagnosis not present

## 2022-01-24 DIAGNOSIS — G8929 Other chronic pain: Secondary | ICD-10-CM | POA: Diagnosis not present

## 2022-01-24 NOTE — Therapy (Addendum)
PHYSICAL THERAPY DISCHARGE SUMMARY  Visits from Start of Care: 10   Current functional level related to goals / functional outcomes: Decreased frequency/intensity of LBP with transfers, improved mobility, transition to gym based exercise program.  Most goals met or almost met, FOTO 70%, reports 90% overall improvement.    Remaining deficits: Intermittent LBP   Education / Equipment: HEP   Plan: Patient agrees to discharge.  Patient is being discharged due to meeting the stated rehab goals.  Patient no-showed last visit then arrived a day late for visit, this has been a frequent issue throughout this episode, resulting in frequent no-shows. Spoke to patient when arrived and plan to discharge and he was agreeable.    [name], PT, DPT 02/13/2022.    [facility] [address]  [address] [place], [place], [address] Phone: [phone]   Fax:  [phone]  Physical Therapy Treatment  Patient Details  Name: Dean Robertson MRN: [id number] Date of Birth: [birth year] Referring Provider (PT): Juanda Chance   Encounter Date: 01/24/2022   PT End of Session - 01/24/22 0934     Visit Number 10    Number of Visits 15    Date for PT Re-Evaluation 01/31/22    Authorization Type [employer]    Progress Note Due on Visit 15    PT Start Time 0930    PT Stop Time 1015    PT Time Calculation (min) 45 min    Activity Tolerance Patient tolerated treatment well    Behavior During Therapy [facility] for tasks assessed/performed             Past Medical History:  Diagnosis Date   Anxiety    Arthritis    shoulders   Coronary artery disease    Diabetes mellitus without complication ([facility])    H/O heart artery stent    x1   High cholesterol    Hypertension     Past Surgical History:  Procedure Laterality Date   COLONOSCOPY     2012- normal    CORONARY ANGIOPLASTY WITH STENT PLACEMENT     CORONARY STENT INTERVENTION N/A  12/23/2020   Procedure: CORONARY STENT INTERVENTION;  Surgeon: Tonny Bollman, MD;  Location: [facility];  Service: Cardiovascular;  Laterality: N/A;   LEFT HEART CATH AND CORONARY ANGIOGRAPHY N/A 12/23/2020   Procedure: LEFT HEART CATH AND CORONARY ANGIOGRAPHY;  Surgeon: Rinaldo Cloud, MD;  Location: [facility];  Service: Cardiovascular;  Laterality: N/A;    There were no vitals filed for this visit.   Subjective Assessment - 01/24/22 0932     Subjective Pt. reports still gets spasms off and on.  Like this morning, had a spasm that "grabbed me and left me."    Pertinent History Patient reports pain has been ongoing for months. His pain is exacerbated by laying down and moving from a sitting to standing position. Patient describes pain as sorenes  Patient had surgical consultation with Dr. Vira Browns on 07/26/2021 and feels that patient is not a surgical candidate at this time, however he could benefit from facet joint blocks with possible radiofrequency ablation. Patient did have bilateral L2 transforaminal epidural steroid injection by Dr. Tyrell Antonio on 02/23/2021 that did help to alleviate pain, however pain relief was short lived. Patient states his pain is not severe at this time, currently rates as 4 out of 10 and reports he would like to attend formal physical therapy before moving forward  with more invasive procedures.    Diagnostic tests recent lumbar MRI exhibits lumbar spondylosis, short pedicles causing moderate impingement at L2-3 and L4-5, and mild impingement at L3-4 and L5-S1.    Patient Stated Goals "to get better"  "move better"    Currently in Pain? No/denies                               [facility] Adult PT Treatment/Exercise - 01/24/22 0001       Lumbar Exercises: Aerobic   Elliptical L1 x 5 min      Lumbar Exercises: Machines for Strengthening   Leg Press 40lb x20    Other Lumbar Machine Exercise lat pull down 35# 2 x 10      Lumbar  Exercises: Standing   Other Standing Lumbar Exercises RDLs with 10# each hand x 15, squat with press 10# weight 2 x 10      Manual Therapy   Manual Therapy Soft tissue mobilization;Myofascial release;Other (comment)    Manual therapy comments to decrease spasm in low back    Soft tissue mobilization STM to lumbar paraspinals    Myofascial Release TPR R piriformis    Other Manual Therapy skilled palpation and monitoring during dry needling.              Trigger Point Dry Needling - 01/24/22 0001     Consent Given? Yes    Education Handout Provided Previously provided    Muscles Treated Back/Hip Lumbar multifidi;Gluteus medius;Piriformis    Dry Needling Comments bilateral, L2-5    Gluteus Medius Response Twitch response elicited;Palpable increased muscle length    Piriformis Response Twitch response elicited;Palpable increased muscle length    Lumbar multifidi Response Twitch response elicited;Palpable increased muscle length                     PT Short Term Goals - 11/14/21 0851       PT SHORT TERM GOAL #1   Title Pt. will be independent with initial HEP.    Time 2    Period Weeks    Status Achieved    Target Date 11/22/21               PT Long Term Goals - 01/24/22 0936       PT LONG TERM GOAL #1   Title Pt. will be compliant with progressed HEP to improve outcomes.    Time 6    Period Weeks    Status Partially Met   12/12/21- met for current   Target Date 12/20/21      PT LONG TERM GOAL #2   Title Pt. will be able to transition from standing to sitting without pain.    Baseline 10/10 buttock pain with transitions to sitting.    Time 4    Period Weeks    Status Partially Met   01/17/22- occasional shooting pains when going from standing to sitting   Target Date 01/31/22      PT LONG TERM GOAL #3   Title Pt. will report 75% improvement in back pain and radicular symptoms.    Time 4    Period Weeks    Status Achieved   12/12/21- 90% improvement  overall   Target Date 01/31/22      PT LONG TERM GOAL #4   Title Pt. will be able to transition to gym based exercise program.    Baseline would like  to join gym, stopped exercising because of pain    Time 4    Period Weeks    Status On-going    Target Date 01/31/22      PT LONG TERM GOAL #5   Title Pt. will improve score on FOTO to = or > 59% to demonstrate improve QOL.    Baseline 52%    Time 6    Period Weeks    Status Achieved   12/12/21- 70%   Target Date 12/20/21                   Plan - 01/24/22 1220     Clinical Impression Statement Patient reports decreased incidence of muscle spasms, only occasional spasms now and not every day.  continued to focus on gym based exercises with increased weights today, more loading of spine with weighted squats and RDLs, no pain with exercises.  Manual therapy at end of session, noted R side paraspinals still tighter on R than L, good response to interventions and dry needling.  Discussed again joining gym as he does enjoy these exercises, and plan to discharge next visit.    Personal Factors and Comorbidities Time since onset of injury/illness/exacerbation;Comorbidity 3+    Comorbidities T2DM with polyneuropathy, history MI, heart disease, chronic LBP    PT Frequency 2x / week    PT Duration 6 weeks    PT Treatment/Interventions ADLs/Self Care Home Management;Cryotherapy;Electrical Stimulation;Moist Heat;Traction;Ultrasound;Stair training;Functional mobility training;Therapeutic activities;Therapeutic exercise;Balance training;Gait training;Neuromuscular re-education;Passive range of motion;Dry needling;Manual techniques;Taping;Spinal Manipulations;Joint Manipulations    PT Next Visit Plan keep progressing core strengthening and hip strengthening; consider dry needling to lumbar paraspinals, modalities and manual therapy PRN    PT Home Exercise Plan [email or web address]    Consulted and Agree with Plan of Care Patient             Patient  will benefit from skilled therapeutic intervention in order to improve the following deficits and impairments:  Decreased activity tolerance, Decreased endurance, Decreased range of motion, Increased fascial restricitons, Impaired perceived functional ability, Improper body mechanics, Pain, Decreased mobility, Increased muscle spasms, Impaired flexibility, Postural dysfunction  Visit Diagnosis: Chronic bilateral low back pain with bilateral sciatica  Muscle spasm of back  Posture abnormality     Problem List Patient Active Problem List   Diagnosis Date Noted   Non-ST elevation (NSTEMI) myocardial infarction [facility])    Acute coronary syndrome (H[facility]) 12/22/2020   Tick bite 05/17/2020   Anxiety 02/05/2019   Essential hypertension 12/08/2017   Type 2 diabetes mellitus without complication, with long-term current use of insulin ([facility]) 12/08/2017   Chest pain 10/04/2017   ASHD (arteriosclerotic heart disease) 11/15/2015   Hyperlipidemia 11/15/2015   Neuropathy 11/15/2015   Sprain/strain 11/27/2013    [name], PT, DPT  01/24/2022, 12:23 PM  [facility] [address]  [address] [place], [place], [address] Phone: [phone]   Fax:  [phone]  Name: Neomiah [place] MRN: [id number] Date of Birth: 01/24/1961

## 2022-01-29 ENCOUNTER — Other Ambulatory Visit: Payer: Self-pay | Admitting: Nurse Practitioner

## 2022-01-30 ENCOUNTER — Ambulatory Visit: Payer: Medicare Other | Admitting: Physical Therapy

## 2022-01-31 ENCOUNTER — Encounter: Payer: Medicare HMO | Admitting: Physical Therapy

## 2022-02-01 DIAGNOSIS — E785 Hyperlipidemia, unspecified: Secondary | ICD-10-CM | POA: Diagnosis not present

## 2022-02-01 DIAGNOSIS — E119 Type 2 diabetes mellitus without complications: Secondary | ICD-10-CM | POA: Diagnosis not present

## 2022-02-05 ENCOUNTER — Other Ambulatory Visit: Payer: Self-pay | Admitting: Nurse Practitioner

## 2022-02-07 NOTE — Telephone Encounter (Signed)
Patient needs follow up scheduled.

## 2022-02-19 ENCOUNTER — Other Ambulatory Visit: Payer: Self-pay | Admitting: Nurse Practitioner

## 2022-03-02 DIAGNOSIS — M4312 Spondylolisthesis, cervical region: Secondary | ICD-10-CM | POA: Diagnosis not present

## 2022-03-02 DIAGNOSIS — Y999 Unspecified external cause status: Secondary | ICD-10-CM | POA: Diagnosis not present

## 2022-03-02 DIAGNOSIS — S0990XA Unspecified injury of head, initial encounter: Secondary | ICD-10-CM | POA: Diagnosis not present

## 2022-03-02 DIAGNOSIS — R519 Headache, unspecified: Secondary | ICD-10-CM | POA: Diagnosis not present

## 2022-03-02 DIAGNOSIS — S0001XA Abrasion of scalp, initial encounter: Secondary | ICD-10-CM | POA: Diagnosis not present

## 2022-03-02 DIAGNOSIS — W228XXA Striking against or struck by other objects, initial encounter: Secondary | ICD-10-CM | POA: Diagnosis not present

## 2022-03-02 DIAGNOSIS — M542 Cervicalgia: Secondary | ICD-10-CM | POA: Diagnosis not present

## 2022-05-10 DIAGNOSIS — I10 Essential (primary) hypertension: Secondary | ICD-10-CM | POA: Diagnosis not present

## 2022-05-10 DIAGNOSIS — I2510 Atherosclerotic heart disease of native coronary artery without angina pectoris: Secondary | ICD-10-CM | POA: Diagnosis not present

## 2022-05-10 DIAGNOSIS — E785 Hyperlipidemia, unspecified: Secondary | ICD-10-CM | POA: Diagnosis not present

## 2022-05-10 DIAGNOSIS — E119 Type 2 diabetes mellitus without complications: Secondary | ICD-10-CM | POA: Diagnosis not present

## 2022-08-14 ENCOUNTER — Other Ambulatory Visit: Payer: Self-pay

## 2022-08-14 DIAGNOSIS — E1165 Type 2 diabetes mellitus with hyperglycemia: Secondary | ICD-10-CM

## 2022-08-14 MED ORDER — TRUE METRIX BLOOD GLUCOSE TEST VI STRP: ORAL_STRIP | 12 refills | Status: AC

## 2022-08-14 MED ORDER — BD SWAB SINGLE USE REGULAR PADS: 1.0000 | MEDICATED_PAD | Freq: Three times a day (TID) | 0 refills | Status: AC

## 2022-08-14 MED ORDER — TRUE METRIX LEVEL 1 LOW VI SOLN: 1.0000 | 0 refills | Status: AC

## 2022-08-14 MED ORDER — TRUE METRIX AIR GLUCOSE METER W/DEVICE KIT: 1.0000 | PACK | Freq: Three times a day (TID) | 0 refills | Status: AC

## 2022-08-14 MED ORDER — LANCETS MISC: 1.0000 | Freq: Three times a day (TID) | 11 refills | Status: AC

## 2022-09-02 ENCOUNTER — Other Ambulatory Visit: Payer: Self-pay

## 2022-09-02 ENCOUNTER — Emergency Department (HOSPITAL_BASED_OUTPATIENT_CLINIC_OR_DEPARTMENT_OTHER)
Admission: EM | Admit: 2022-09-02 | Discharge: 2022-09-02 | Disposition: A | Discharge status: Home / Self Care | Payer: Medicare HMO | Attending: Emergency Medicine | Admitting: Emergency Medicine

## 2022-09-02 ENCOUNTER — Emergency Department (HOSPITAL_BASED_OUTPATIENT_CLINIC_OR_DEPARTMENT_OTHER): Payer: Medicare HMO

## 2022-09-02 ENCOUNTER — Encounter (HOSPITAL_BASED_OUTPATIENT_CLINIC_OR_DEPARTMENT_OTHER): Payer: Self-pay | Admitting: Emergency Medicine

## 2022-09-02 DIAGNOSIS — E119 Type 2 diabetes mellitus without complications: Secondary | ICD-10-CM | POA: Insufficient documentation

## 2022-09-02 DIAGNOSIS — Z7902 Long term (current) use of antithrombotics/antiplatelets: Secondary | ICD-10-CM | POA: Diagnosis not present

## 2022-09-02 DIAGNOSIS — Z7984 Long term (current) use of oral hypoglycemic drugs: Secondary | ICD-10-CM | POA: Diagnosis not present

## 2022-09-02 DIAGNOSIS — M7731 Calcaneal spur, right foot: Secondary | ICD-10-CM | POA: Insufficient documentation

## 2022-09-02 DIAGNOSIS — M79671 Pain in right foot: Secondary | ICD-10-CM | POA: Diagnosis present

## 2022-09-02 DIAGNOSIS — Z7982 Long term (current) use of aspirin: Secondary | ICD-10-CM | POA: Insufficient documentation

## 2022-09-02 DIAGNOSIS — Z794 Long term (current) use of insulin: Secondary | ICD-10-CM | POA: Diagnosis not present

## 2022-09-02 NOTE — ED Triage Notes (Signed)
Pt c/o RT heel pain x 1 wk, no injury

## 2022-09-02 NOTE — ED Provider Notes (Signed)
[facility] EMERGENCY DEPARTMENT Provider Note   CSN: [id number] Arrival date & time: 09/02/22  1648     History  Chief Complaint  Patient presents with   Foot Pain    Dean Robertson is a 61 y.o. male who presents to the emergency department with concerns for right foot pain onset 3 weeks.  Denies recent injury, trauma, fall.  No meds tried prior to arrival.  Denies swelling, gait problem.  Denies his pain being worse with his first steps of the day.  The history is provided by the patient. No language interpreter was used.       Home Medications Prior to Admission medications   Medication Sig Start Date End Date Taking? Authorizing Provider  acetaminophen (TYLENOL) 500 MG tablet Take 1 tablet (500 mg total) by mouth every 6 (six) hours as needed. Patient taking differently: Take 500 mg by mouth every 6 (six) hours as needed for mild pain. 12/08/17   Dorena Dew, FNP  Alcohol Swabs (B-D SINGLE USE SWABS REGULAR) PADS 1 each by Does not apply route in the morning, at noon, and at bedtime. 08/14/22   Fenton Foy, NP  aspirin EC 81 MG tablet Take 81 mg by mouth daily.    [provider]  atorvastatin (LIPITOR) 80 MG tablet Take 1 tablet (80 mg total) by mouth daily. 12/26/20   Dixie Dials, MD  Blood Glucose Calibration (TRUE METRIX LEVEL 1) Low SOLN 1 each by In Vitro route as directed. 08/14/22   Fenton Foy, NP  Blood Glucose Monitoring Suppl (TRUE METRIX AIR GLUCOSE METER) w/Device KIT 1 each by Does not apply route 3 (three) times daily. 08/14/22   Fenton Foy, NP  busPIRone (BUSPAR) 10 MG tablet Take 1 tablet (10 mg total) by mouth 2 (two) times daily. 10/27/20   Vevelyn Francois, NP  clopidogrel (PLAVIX) 75 MG tablet Take 1 tablet (75 mg total) by mouth daily with breakfast. 12/26/20   Dixie Dials, MD  cyclobenzaprine (FLEXERIL) 5 MG tablet Take 1 tablet (5 mg total) by mouth 3 (three) times daily as needed for muscle spasms. 10/27/20    Vevelyn Francois, NP  diclofenac Sodium (VOLTAREN) 1 % GEL Apply 2 g topically 4 (four) times daily. Patient taking differently: Apply 2 g topically 4 (four) times daily as needed (pain). 08/24/20   Jessy Oto, MD  gabapentin (NEURONTIN) 300 MG capsule TAKE 1 CAPSULE(300 MG) BY MOUTH THREE TIMES DAILY Patient taking differently: Take 300 mg by mouth 3 (three) times daily as needed (pain). 05/06/20   Azzie Glatter, FNP  glipiZIDE (GLUCOTROL) 5 MG tablet TAKE ONE TABLET BY MOUTH TWICE DAILY @ 9AM & 5PM BEFORE A MEAL 02/07/22   Fenton Foy, NP  glucose blood (TRUE METRIX BLOOD GLUCOSE TEST) test strip Check blood sugar three times a day 08/14/22   Fenton Foy, NP  HYDROcodone-acetaminophen (NORCO/VICODIN) 5-325 MG tablet Take 1-2 tablets by mouth every 6 (six) hours as needed. 06/24/21   Charlann Lange, PA-C  ibuprofen (ADVIL) 200 MG tablet Take 200 mg by mouth every 6 (six) hours as needed for headache.    [provider]  insulin glargine (LANTUS) 100 UNIT/ML Solostar Pen Inject 50 Units into the skin daily. Patient taking differently: Inject 50 Units into the skin at bedtime. 05/10/21   Vevelyn Francois, NP  Insulin Pen Needle (COMFORT TOUCH INSULIN PEN NEED) 31G X 6 MM MISC 1 pen by Does not apply route  daily. 05/10/21   Vevelyn Francois, NP  isosorbide mononitrate (IMDUR) 30 MG 24 hr tablet Take 30 mg by mouth every morning. 01/02/21   [provider]  ketorolac (TORADOL) 10 MG tablet Take 10 mg by mouth every 6 (six) hours as needed. 07/13/21   [provider]  Lancets MISC 1 each by Does not apply route 3 (three) times daily. 08/14/22   Fenton Foy, NP  lisinopril (ZESTRIL) 20 MG tablet TAKE 1 TABLET(20 MG) BY MOUTH DAILY Patient taking differently: Take 20 mg by mouth daily. TAKE 1 TABLET(20 MG) BY MOUTH DAILY 10/27/20   Vevelyn Francois, NP  Menthol, Topical Analgesic, (BIOFREEZE) 4 % GEL Apply 1 application topically 4 (four) times daily as needed. Patient  taking differently: Apply 1 application topically 4 (four) times daily as needed (shoulder pain). 05/17/20   Azzie Glatter, FNP  metFORMIN (GLUCOPHAGE) 1000 MG tablet Take 1 tablet (1,000 mg total) by mouth 2 (two) times daily with a meal. Starting Monday. 12/25/20   Dixie Dials, MD  methocarbamol (ROBAXIN) 750 MG tablet Take 1 tablet (750 mg total) by mouth 2 (two) times daily. 06/24/21   Charlann Lange, PA-C  nitroGLYCERIN (NITROSTAT) 0.4 MG SL tablet Place 1 tablet (0.4 mg total) under the tongue every 5 (five) minutes x 3 doses as needed for chest pain. 12/25/20   Dixie Dials, MD  ondansetron (ZOFRAN ODT) 4 MG disintegrating tablet Take 1 tablet (4 mg total) by mouth every 8 (eight) hours as needed for nausea or vomiting. 06/24/21   Charlann Lange, PA-C  propranolol (INDERAL) 40 MG tablet Take 1 tablet (40 mg total) by mouth 2 (two) times daily. As needed for anxiety Patient taking differently: Take 40 mg by mouth 2 (two) times daily as needed (anxiety). As needed for anxiety 10/27/20   Vevelyn Francois, NP      Allergies    Tramadol    Review of Systems   Review of Systems  All other systems reviewed and are negative.   Physical Exam Updated Vital Signs BP (!) 141/94   Pulse 80   Temp 97.8 F (36.6 C) (Oral)   Resp 16   Ht _0  (1.727 m)   Wt 72.6 kg   SpO2 99%   BMI 24.33 kg/m  Physical Exam Vitals and nursing note reviewed.  Constitutional:      General: He is not in acute distress.    Appearance: Normal appearance.  Eyes:     General: No scleral icterus.    Extraocular Movements: Extraocular movements intact.  Cardiovascular:     Rate and Rhythm: Normal rate.  Pulmonary:     Effort: Pulmonary effort is normal. No respiratory distress.  Abdominal:     Palpations: Abdomen is soft. There is no mass.     Tenderness: There is no abdominal tenderness.  Musculoskeletal:        General: Normal range of motion.     Cervical back: Neck supple.     Comments: tenderness  to palpation noted to right heel without overlying skin changes.  Pedal pulses intact.  Normal flexion and extension of right ankle.  No obvious swelling noted to the area.  Skin:    General: Skin is warm and dry.     Findings: No rash.  Neurological:     Mental Status: He is alert.     Sensory: Sensation is intact.     Motor: Motor function is intact.  Psychiatric:  Behavior: Behavior normal.     ED Results / Procedures / Treatments   Labs (all labs ordered are listed, but only abnormal results are displayed) Labs Reviewed - No data to display  EKG None  Radiology DG Foot Complete Right  Result Date: 09/02/2022 CLINICAL DATA:  Heel pain for 1 week, no known injury. EXAM: RIGHT FOOT COMPLETE - 3+ VIEW COMPARISON:  None Available. FINDINGS: There is no evidence of fracture or dislocation. A small plantar calcaneal enthesophyte is noted. Soft tissues are unremarkable. IMPRESSION: Small plantar calcaneal enthesophyte. Electronically Signed   By: Zerita Boers M.D.   On: 09/02/2022 17:17    Procedures Procedures    Medications Ordered in ED Medications - No data to display  ED Course/ Medical Decision Making/ A&P                           Medical Decision Making Amount and/or Complexity of Data Reviewed Radiology: ordered.   Pt presents with concerns for right foot pain onset 3 weeks.  Denies recent injury, trauma, fall.  No meds tried prior to arrival.  Patient afebrile.  On exam patient with tenderness to palpation noted to right heel without overlying skin changes.  Pedal pulses intact.  Normal flexion and extension of right ankle.  Differential diagnosis includes fracture, dislocation, cellulitis, abscess, plantar fasciitis, heel spur.   Co morbidities that complicate the patient evaluation: DM   Imaging: I ordered imaging studies including  right foot X-ray I independently visualized and interpreted imaging which showed:  Small plantar calcaneal enthesophyte.    I agree with the radiologist interpretation   Disposition: Presenting suspicious for heel spur to the right foot.  Doubt fracture, dislocation, cellulitis, abscess at this time.  No concern for plantar fasciitis at this time. After consideration of the diagnostic results and the patients response to treatment, I feel that the patient would benefit from Discharge home.  Patient provided with information for podiatry for follow-up regarding today's ED visit.  Supportive care measures and strict return precautions discussed with patient at bedside. Pt acknowledges and verbalizes understanding. Pt appears safe for discharge. Follow up as indicated in discharge paperwork.    This chart was dictated using voice recognition software, Dragon. Despite the best efforts of this provider to proofread and correct errors, errors may still occur which can change documentation meaning.   Final Clinical Impression(s) / ED Diagnoses Final diagnoses:  Pain of right heel  Calcaneal spur of right foot    Rx / DC Orders ED Discharge Orders     None         Elisama Thissen A, PA-C 09/02/22 1732    Curatolo, Adam, DO 09/02/22 1738

## 2022-09-02 NOTE — Discharge Instructions (Addendum)
It was a pleasure taking care of you today!  Your x-ray did not show any concerns for any fractures or dislocations.  It did show concern for a heel spur.  You may treat this with 500 mg Tylenol every 6 hours as needed for pain.  You may place ice to the affected area up to 15 minutes at a time, ensure to place a barrier between your skin and the ice.  You will be given information for the on-call podiatrist, call and set up a follow-up appointment regarding today's ED visit.  Return to the emergency department for experiencing increasing/worsening symptoms.

## 2022-10-03 ENCOUNTER — Telehealth: Payer: Self-pay | Admitting: Pharmacist

## 2022-10-03 NOTE — Progress Notes (Signed)
Attempted to outreach patient as last documented BP was elevated. Unable to leave voicemail.   Catie Hedwig Morton, PharmD, [facility] [phone]

## 2023-04-27 ENCOUNTER — Encounter (HOSPITAL_BASED_OUTPATIENT_CLINIC_OR_DEPARTMENT_OTHER): Payer: Self-pay | Admitting: Emergency Medicine

## 2023-04-27 ENCOUNTER — Other Ambulatory Visit: Payer: Self-pay

## 2023-04-27 ENCOUNTER — Emergency Department (HOSPITAL_BASED_OUTPATIENT_CLINIC_OR_DEPARTMENT_OTHER)
Admission: EM | Admit: 2023-04-27 | Discharge: 2023-04-27 | Disposition: A | Discharge status: Home / Self Care | Payer: Medicare HMO | Attending: Emergency Medicine | Admitting: Emergency Medicine

## 2023-04-27 ENCOUNTER — Emergency Department (HOSPITAL_BASED_OUTPATIENT_CLINIC_OR_DEPARTMENT_OTHER): Payer: Medicare HMO

## 2023-04-27 DIAGNOSIS — I10 Essential (primary) hypertension: Secondary | ICD-10-CM | POA: Insufficient documentation

## 2023-04-27 DIAGNOSIS — Z79899 Other long term (current) drug therapy: Secondary | ICD-10-CM | POA: Diagnosis not present

## 2023-04-27 DIAGNOSIS — I82502 Chronic embolism and thrombosis of unspecified deep veins of left lower extremity: Secondary | ICD-10-CM | POA: Insufficient documentation

## 2023-04-27 DIAGNOSIS — E119 Type 2 diabetes mellitus without complications: Secondary | ICD-10-CM | POA: Diagnosis not present

## 2023-04-27 DIAGNOSIS — Z7902 Long term (current) use of antithrombotics/antiplatelets: Secondary | ICD-10-CM | POA: Diagnosis not present

## 2023-04-27 DIAGNOSIS — I824Z2 Acute embolism and thrombosis of unspecified deep veins of left distal lower extremity: Secondary | ICD-10-CM

## 2023-04-27 DIAGNOSIS — M79662 Pain in left lower leg: Secondary | ICD-10-CM | POA: Diagnosis present

## 2023-04-27 DIAGNOSIS — Z7982 Long term (current) use of aspirin: Secondary | ICD-10-CM | POA: Insufficient documentation

## 2023-04-27 DIAGNOSIS — Z7984 Long term (current) use of oral hypoglycemic drugs: Secondary | ICD-10-CM | POA: Diagnosis not present

## 2023-04-27 DIAGNOSIS — Z794 Long term (current) use of insulin: Secondary | ICD-10-CM | POA: Insufficient documentation

## 2023-04-27 LAB — CBC WITH DIFFERENTIAL/PLATELET
Abs Immature Granulocytes: 0.01 10*3/uL (ref 0.00–0.07)
Basophils Absolute: 0.1 10*3/uL (ref 0.0–0.1)
Basophils Relative: 1 %
Eosinophils Absolute: 0.1 10*3/uL (ref 0.0–0.5)
Eosinophils Relative: 2 %
HCT: 40.7 % (ref 39.0–52.0)
Hemoglobin: 13.6 g/dL (ref 13.0–17.0)
Immature Granulocytes: 0 %
Lymphocytes Relative: 31 %
Lymphs Abs: 1.2 10*3/uL (ref 0.7–4.0)
MCH: 29.7 pg (ref 26.0–34.0)
MCHC: 33.4 g/dL (ref 30.0–36.0)
MCV: 88.9 fL (ref 80.0–100.0)
Monocytes Absolute: 0.4 10*3/uL (ref 0.1–1.0)
Monocytes Relative: 10 %
Neutro Abs: 2.1 10*3/uL (ref 1.7–7.7)
Neutrophils Relative %: 56 %
Platelets: 262 10*3/uL (ref 150–400)
RBC: 4.58 MIL/uL (ref 4.22–5.81)
RDW: 12.4 % (ref 11.5–15.5)
WBC: 3.7 10*3/uL — ABNORMAL LOW (ref 4.0–10.5)
nRBC: 0 % (ref 0.0–0.2)

## 2023-04-27 LAB — BASIC METABOLIC PANEL
Anion gap: 8 (ref 5–15)
BUN: 18 mg/dL (ref 8–23)
CO2: 26 mmol/L (ref 22–32)
Calcium: 8.6 mg/dL — ABNORMAL LOW (ref 8.9–10.3)
Chloride: 101 mmol/L (ref 98–111)
Creatinine, Ser: 1.06 mg/dL (ref 0.61–1.24)
GFR, Estimated: >60 mL/min (ref >60)
Glucose, Bld: 227 mg/dL — ABNORMAL HIGH (ref 70–99)
Potassium: 3.7 mmol/L (ref 3.5–5.1)
Sodium: 135 mmol/L (ref 135–145)

## 2023-04-27 MED ORDER — APIXABAN (ELIQUIS) VTE STARTER PACK (10MG AND 5MG): ORAL_TABLET | ORAL | 0 refills | Status: AC

## 2023-04-27 NOTE — ED Triage Notes (Signed)
Pt via POV reporting left leg paresthesia since Wednesday afternoon at around 3pm. He says he bent over while he was doing yard work and noticed the numbness as well as a brief period of dizziness, since resolved. Pt is also experiencing severe pain to left heel, new onset since Wed as well, with inability to tolerate weight bearing. New limp favoring left side. OTC treatments have not helped.

## 2023-04-27 NOTE — ED Provider Notes (Signed)
[facility] EMERGENCY DEPARTMENT AT [facility] Provider Note   CSN: [id number] Arrival date & time: 04/27/23  1059     History  No chief complaint on file.   Dean Robertson is a 62 y.o. male.  Patient presents to the emergency department complaining of left lower leg pain which began on Wednesday.  He states he began to have some pain with some possible numbness at that time.  He states he stood up after helping his friend work outside and had a near syncopal episode but has had no further episodes of near syncope since that time.  He has tried over-the-counter medications without improvement in symptoms.  He denies chest pain, shortness of breath, abdominal pain, nausea, vomiting.  Past medical history significant for type II DM, hypertension, NSTEMI  HPI     Home Medications Prior to Admission medications   Medication Sig Start Date End Date Taking? Authorizing Provider  APIXABAN (ELIQUIS) VTE STARTER PACK (10MG  AND 5MG ) Take as directed on package: start with two-5mg  tablets twice daily for 7 days. On day 8, switch to one-5mg  tablet twice daily. 04/27/23  Yes Darrick Grinder, PA-C  acetaminophen (TYLENOL) 500 MG tablet Take 1 tablet (500 mg total) by mouth every 6 (six) hours as needed. Patient taking differently: Take 500 mg by mouth every 6 (six) hours as needed for mild pain. 12/08/17   Massie Maroon, FNP  Alcohol Swabs (B-D SINGLE USE SWABS REGULAR) PADS 1 each by Does not apply route in the morning, at noon, and at bedtime. 08/14/22   Ivonne Andrew, NP  aspirin EC 81 MG tablet Take 81 mg by mouth daily.    [provider]  atorvastatin (LIPITOR) 80 MG tablet Take 1 tablet (80 mg total) by mouth daily. 12/26/20   Orpah Cobb, MD  Blood Glucose Calibration (TRUE METRIX LEVEL 1) Low SOLN 1 each by In Vitro route as directed. 08/14/22   Ivonne Andrew, NP  Blood Glucose Monitoring Suppl (TRUE METRIX AIR GLUCOSE METER) w/Device KIT 1 each by Does not apply  route 3 (three) times daily. 08/14/22   Ivonne Andrew, NP  busPIRone (BUSPAR) 10 MG tablet Take 1 tablet (10 mg total) by mouth 2 (two) times daily. 10/27/20   Barbette Merino, NP  clopidogrel (PLAVIX) 75 MG tablet Take 1 tablet (75 mg total) by mouth daily with breakfast. 12/26/20   Orpah Cobb, MD  cyclobenzaprine (FLEXERIL) 5 MG tablet Take 1 tablet (5 mg total) by mouth 3 (three) times daily as needed for muscle spasms. 10/27/20   Barbette Merino, NP  diclofenac Sodium (VOLTAREN) 1 % GEL Apply 2 g topically 4 (four) times daily. Patient taking differently: Apply 2 g topically 4 (four) times daily as needed (pain). 08/24/20   Kerrin Champagne, MD  gabapentin (NEURONTIN) 300 MG capsule TAKE 1 CAPSULE(300 MG) BY MOUTH THREE TIMES DAILY Patient taking differently: Take 300 mg by mouth 3 (three) times daily as needed (pain). 05/06/20   Kallie Locks, FNP  glipiZIDE (GLUCOTROL) 5 MG tablet TAKE ONE TABLET BY MOUTH TWICE DAILY @ 9AM & 5PM BEFORE A MEAL 02/07/22   Ivonne Andrew, NP  glucose blood (TRUE METRIX BLOOD GLUCOSE TEST) test strip Check blood sugar three times a day 08/14/22   Ivonne Andrew, NP  HYDROcodone-acetaminophen (NORCO/VICODIN) 5-325 MG tablet Take 1-2 tablets by mouth every 6 (six) hours as needed. 06/24/21   Elpidio Anis, PA-C  insulin glargine (LANTUS) 100 UNIT/ML  Solostar Pen Inject 50 Units into the skin daily. Patient taking differently: Inject 50 Units into the skin at bedtime. 05/10/21   Barbette Merino, NP  Insulin Pen Needle (COMFORT TOUCH INSULIN PEN NEED) 31G X 6 MM MISC 1 pen by Does not apply route daily. 05/10/21   Barbette Merino, NP  isosorbide mononitrate (IMDUR) 30 MG 24 hr tablet Take 30 mg by mouth every morning. 01/02/21   [provider]  Lancets MISC 1 each by Does not apply route 3 (three) times daily. 08/14/22   Ivonne Andrew, NP  lisinopril (ZESTRIL) 20 MG tablet TAKE 1 TABLET(20 MG) BY MOUTH DAILY Patient taking differently: Take 20 mg by  mouth daily. TAKE 1 TABLET(20 MG) BY MOUTH DAILY 10/27/20   Barbette Merino, NP  Menthol, Topical Analgesic, (BIOFREEZE) 4 % GEL Apply 1 application topically 4 (four) times daily as needed. Patient taking differently: Apply 1 application topically 4 (four) times daily as needed (shoulder pain). 05/17/20   Kallie Locks, FNP  metFORMIN (GLUCOPHAGE) 1000 MG tablet Take 1 tablet (1,000 mg total) by mouth 2 (two) times daily with a meal. Starting Monday. 12/25/20   Orpah Cobb, MD  methocarbamol (ROBAXIN) 750 MG tablet Take 1 tablet (750 mg total) by mouth 2 (two) times daily. 06/24/21   Elpidio Anis, PA-C  nitroGLYCERIN (NITROSTAT) 0.4 MG SL tablet Place 1 tablet (0.4 mg total) under the tongue every 5 (five) minutes x 3 doses as needed for chest pain. 12/25/20   Orpah Cobb, MD  ondansetron (ZOFRAN ODT) 4 MG disintegrating tablet Take 1 tablet (4 mg total) by mouth every 8 (eight) hours as needed for nausea or vomiting. 06/24/21   Elpidio Anis, PA-C  propranolol (INDERAL) 40 MG tablet Take 1 tablet (40 mg total) by mouth 2 (two) times daily. As needed for anxiety Patient taking differently: Take 40 mg by mouth 2 (two) times daily as needed (anxiety). As needed for anxiety 10/27/20   Barbette Merino, NP      Allergies    Tramadol    Review of Systems   Review of Systems  Physical Exam Updated Vital Signs BP (!) 129/93   Pulse (!) 58   Temp 97.9 F (36.6 C) (Oral)   Resp (!) 21   Ht 5\' 8"  (1.727 m)   Wt 72.8 kg   SpO2 100%   BMI 24.42 kg/m  Physical Exam Vitals and nursing note reviewed.  Constitutional:      General: He is not in acute distress.    Appearance: He is well-developed.  HENT:     Head: Normocephalic and atraumatic.  Eyes:     Conjunctiva/sclera: Conjunctivae normal.  Cardiovascular:     Rate and Rhythm: Normal rate and regular rhythm.     Heart sounds: No murmur heard. Pulmonary:     Effort: Pulmonary effort is normal. No respiratory distress.     Breath  sounds: Normal breath sounds.  Abdominal:     Palpations: Abdomen is soft.     Tenderness: There is no abdominal tenderness.  Musculoskeletal:        General: Swelling present.     Cervical back: Neck supple.     Comments: Left calf with tenderness with dorsiflexion.  Patient with normal range of motion of left lower extremity at both knee and ankle.  Palpable pedal pulse.  Brisk cap refill  Skin:    General: Skin is warm and dry.     Capillary Refill: Capillary refill  takes less than 2 seconds.  Neurological:     Mental Status: He is alert.  Psychiatric:        Mood and Affect: Mood normal.     ED Results / Procedures / Treatments   Labs (all labs ordered are listed, but only abnormal results are displayed) Labs Reviewed  BASIC METABOLIC PANEL - Abnormal; Notable for the following components:      Result Value   Glucose, Bld 227 (*)    Calcium 8.6 (*)    All other components within normal limits  CBC WITH DIFFERENTIAL/PLATELET - Abnormal; Notable for the following components:   WBC 3.7 (*)    All other components within normal limits    EKG None  Radiology US Venous Img Lower  Left (DVT Study)  Result Date: 04/27/2023 CLINICAL DATA:  Left calf and ankle pain EXAM: LEFT LOWER EXTREMITY VENOUS DOPPLER ULTRASOUND TECHNIQUE: Gray-scale sonography with graded compression, as well as color Doppler and duplex ultrasound were performed to evaluate the lower extremity deep venous systems from the level of the common femoral vein and including the common femoral, femoral, profunda femoral, popliteal and calf veins including the posterior tibial, peroneal and gastrocnemius veins when visible. The superficial great saphenous vein was also interrogated. Spectral Doppler was utilized to evaluate flow at rest and with distal augmentation maneuvers in the common femoral, femoral and popliteal veins. COMPARISON:  None Available. FINDINGS: Contralateral Common Femoral Vein: Respiratory phasicity  is normal and symmetric with the symptomatic side. No evidence of thrombus. Normal compressibility. Common Femoral Vein: No evidence of thrombus. Normal compressibility, respiratory phasicity and response to augmentation. Saphenofemoral Junction: No evidence of thrombus. Normal compressibility and flow on color Doppler imaging. Profunda Femoral Vein: No evidence of thrombus. Normal compressibility and flow on color Doppler imaging. Femoral Vein: No evidence of thrombus. Normal compressibility, respiratory phasicity and response to augmentation. Popliteal Vein: No evidence of thrombus. Normal compressibility, respiratory phasicity and response to augmentation. Calf Veins: Occlusive thrombus within the paired posterior tibial veins within the distal calf and extending to the level of the ankle. Peroneal vein is patent. Superficial Great Saphenous Vein: No evidence of thrombus. Normal compressibility. Venous Reflux:  None. Other Findings:  None. IMPRESSION: 1. Positive for occlusive DVT within the paired posterior tibial veins within the distal calf and extending to the level of the ankle. 2. No evidence of DVT above the level of the knee. Electronically Signed   By: Duanne Guess D.O.   On: 04/27/2023 15:13   DG Foot 2 Views Left  Result Date: 04/27/2023 CLINICAL DATA:  Paresthesias EXAM: LEFT FOOT - 2 VIEW COMPARISON:  None Available. FINDINGS: No acute fracture or dislocation is noted. Calcaneal spurring is seen. No soft tissue abnormality is noted. IMPRESSION: No acute abnormality noted. Electronically Signed   By: Alcide Clever M.D.   On: 04/27/2023 11:41    Procedures Procedures    Medications Ordered in ED Medications - No data to display  ED Course/ Medical Decision Making/ A&P                             Medical Decision Making Amount and/or Complexity of Data Reviewed Labs: ordered. Radiology: ordered.  Risk Prescription drug management.   This patient presents to the ED for  concern of lower extremity pain, this involves an extensive number of treatment options, and is a complaint that carries with it a high risk of complications and morbidity.  The differential diagnosis includes DVT, fracture, dislocation, soft tissue injury, others   Co morbidities that complicate the patient evaluation  Hypertension, NSTEMI, type II DM   Additional history obtained:  Additional history obtained from family at bedside   Lab Tests:  I Ordered, and personally interpreted labs.  The pertinent results include: Grossly unremarkable CBC, BMP   Imaging Studies ordered:  I ordered imaging studies including ultrasound of the left lower extremity I independently visualized and interpreted imaging which showed  1. Positive for occlusive DVT within the paired posterior tibial  veins within the distal calf and extending to the level of the  ankle.  2. No evidence of DVT above the level of the knee.  I also ordered and reviewed images of the left foot.  No acute abnormality noted. I agree with the radiologist interpretation   Cardiac Monitoring: / EKG:  The patient was maintained on a cardiac monitor.  I personally viewed and interpreted the cardiac monitored which showed an underlying rhythm of: Sinus rhythm    Test / Admission - Considered:  Patient with apparent acute DVT of left lower extremity.  Patient started on Eliquis.  Patient with no shortness of breath or other life-threatening symptoms.  Plan to discharge home with plans for outpatient follow-up.  Patient voices understanding with plan.         Final Clinical Impression(s) / ED Diagnoses Final diagnoses:  Deep vein thrombosis (DVT) of distal vein of left lower extremity, unspecified chronicity (HCC)    Rx / DC Orders ED Discharge Orders          Ordered    APIXABAN (ELIQUIS) VTE STARTER PACK (10MG  AND 5MG )       Note to Pharmacy: If starter pack unavailable, substitute with seventy-four 5 mg  apixaban tabs following the above SIG directions.   04/27/23 1619              Darrick Grinder, PA-C 04/27/23 1628    Sloan Leiter, DO 04/28/23 2350

## 2023-04-27 NOTE — Discharge Instructions (Addendum)
You were evaluated today for left lower extremity pain.  Your imaging shows signs of a deep vein thrombosis in the left lower leg.  I have prescribed a blood thinner called Eliquis.  Please take as directed.  You should stop taking NSAID medications such as Toradol or ibuprofen while taking this medication.  This medication will increase the likelihood of you experiencing bleeding with injuries.  Please schedule a follow-up appointment with your primary care provider for further evaluation and management as needed

## 2023-04-30 ENCOUNTER — Telehealth: Payer: Self-pay

## 2023-04-30 NOTE — Transitions of Care (Post Inpatient/ED Visit) (Signed)
   04/30/2023  Name: Dean Robertson MRN: [id number] DOB: [birth year]  Today's TOC FU Call Status: Today's TOC FU Call Status:: Unsuccessul Call (1st Attempt) Unsuccessful Call (1st Attempt) Date: 04/30/23  Attempted to reach the patient regarding the most recent Inpatient/ED visit.  Follow Up Plan: Additional outreach attempts will be made to reach the patient to complete the Transitions of Care (Post Inpatient/ED visit) call.   Signature Renelda Loma RMA

## 2023-05-02 ENCOUNTER — Telehealth: Payer: Self-pay

## 2023-05-02 NOTE — Transitions of Care (Post Inpatient/ED Visit) (Signed)
05/02/2023  Name: Dean Robertson MRN: [id number] DOB: April 28, 1961  Today's TOC FU Call Status: Today's TOC FU Call Status:: Successful TOC FU Call Competed TOC FU Call Complete Date: 05/02/23  Transition Care Management Follow-up Telephone Call Date of Discharge: 04/27/23 Discharge Facility: Other (Non[facility]) Name of Other ([facility]) Discharge Facility: [facility]  Items Reviewed:    Medications Reviewed Today: Medications Reviewed Today     Reviewed by [name], PT (Physical Therapist) on 01/24/22 at [phone]  Med List Status: <None>   Medication Order Taking? Sig Documenting Provider Last Dose Status Informant  acetaminophen (TYLENOL) 500 MG tablet [id number] No Take 1 tablet (500 mg total) by mouth every 6 (six) hours as needed.  Patient taking differently: Take 500 mg by mouth every 6 (six) hours as needed for mild pain.   Massie Maroon, FNP Taking Active   aspirin EC 81 MG tablet [id number] No Take 81 mg by mouth daily. [provider] Taking Active Self  atorvastatin (LIPITOR) 80 MG tablet [id number] No Take 1 tablet (80 mg total) by mouth daily. Orpah Cobb, MD Taking Active Self  Blood Glucose Monitoring Suppl (TRUE METRIX AIR GLUCOSE METER) w/Device KIT [id number] No 1 each by Does not apply route 3 (three) times daily.  Patient taking differently: Check blood sugar every morning   Barbette Merino, NP Taking Active   busPIRone (BUSPAR) 10 MG tablet [id number] No Take 1 tablet (10 mg total) by mouth 2 (two) times daily. Barbette Merino, NP Taking Active Self  clopidogrel (PLAVIX) 75 MG tablet [id number] No Take 1 tablet (75 mg total) by mouth daily with breakfast. Orpah Cobb, MD Taking Active Self  cyclobenzaprine (FLEXERIL) 5 MG tablet [id number] No Take 1 tablet (5 mg total) by mouth 3 (three) times daily as needed for muscle spasms. Barbette Merino, NP Taking Active Self  diclofenac Sodium (VOLTAREN) 1 % GEL [id number] No Apply 2 g  topically 4 (four) times daily.  Patient taking differently: Apply 2 g topically 4 (four) times daily as needed (pain).   Kerrin Champagne, MD Taking Active   gabapentin (NEURONTIN) 300 MG capsule [id number] No TAKE 1 CAPSULE(300 MG) BY MOUTH THREE TIMES DAILY  Patient taking differently: Take 300 mg by mouth 3 (three) times daily as needed (pain).   Kallie Locks, FNP Taking Active   glipiZIDE (GLUCOTROL) 5 MG tablet [id number]  Take 1 tablet (5 mg total) by mouth 2 (two) times daily before a meal. Barbette Merino, NP  Active   glucose blood (TRUE METRIX BLOOD GLUCOSE TEST) test strip [id number] No Use as instructed  Patient taking differently: Check blood sugar every morning   Barbette Merino, NP Taking Active   HYDROcodone-acetaminophen (NORCO/VICODIN) 5-325 MG tablet [id number] No Take 1-2 tablets by mouth every 6 (six) hours as needed. Elpidio Anis, PA-C Taking Active            Med Note Lessie Dings Jul 03, 2021  2:09 PM) Did not pick up rx  ibuprofen (ADVIL) 200 MG tablet [id number] No Take 200 mg by mouth every 6 (six) hours as needed for headache. [provider] Taking Active Self  insulin glargine (LANTUS) 100 UNIT/ML Solostar Pen [id number] No Inject 50 Units into the skin daily.  Patient taking differently: Inject 50 Units into the skin at bedtime.   Barbette Merino, NP Taking Active   Insulin Pen Needle (COMFORT TOUCH INSULIN PEN NEED) 31G X 6 MM MISC  [id number] No 1 pen by Does not apply route daily. Barbette Merino, NP Taking Active Self  isosorbide mononitrate (IMDUR) 30 MG 24 hr tablet [id number] No Take 30 mg by mouth every morning. [provider] Taking Active Self  ketorolac (TORADOL) 10 MG tablet [id number] No Take 10 mg by mouth every 6 (six) hours as needed. [provider] Taking Active   Lancets MISC [id number] No 1 each 3 (three) times daily by Does not apply route.  Patient taking differently: Check blood sugar every morning   Massie Maroon, FNP Taking Active   lisinopril (ZESTRIL) 20 MG tablet [id number] No TAKE 1 TABLET(20 MG) BY MOUTH DAILY  Patient taking differently: Take 20 mg by mouth daily. TAKE 1 TABLET(20 MG) BY MOUTH DAILY   Barbette Merino, NP Taking Active Self  Menthol, Topical Analgesic, (BIOFREEZE) 4 % GEL [id number] No Apply 1 application topically 4 (four) times daily as needed.  Patient taking differently: Apply 1 application topically 4 (four) times daily as needed (shoulder pain).   Kallie Locks, FNP Taking Active Self  metFORMIN (GLUCOPHAGE) 1000 MG tablet [id number] No Take 1 tablet (1,000 mg total) by mouth 2 (two) times daily with a meal. Starting Monday. Orpah Cobb, MD Taking Active Self           Med Note (LONG, ASHLEY L   Mon Aug 14, 2021  2:53 PM) Patient taken differently  methocarbamol (ROBAXIN) 750 MG tablet [id number] No Take 1 tablet (750 mg total) by mouth 2 (two) times daily. Elpidio Anis, PA-C Taking Active            Med Note Lessie Dings Jul 03, 2021  2:09 PM) Did not pick up rx   nitroGLYCERIN (NITROSTAT) 0.4 MG SL tablet [id number] No Place 1 tablet (0.4 mg total) under the tongue every 5 (five) minutes x 3 doses as needed for chest pain. Orpah Cobb, MD Taking Active Self  ondansetron (ZOFRAN ODT) 4 MG disintegrating tablet [id number] No Take 1 tablet (4 mg total) by mouth every 8 (eight) hours as needed for nausea or vomiting. Elpidio Anis, PA-C Taking Active            Med Note Lessie Dings Jul 03, 2021  2:10 PM) Did not pick up rx   propranolol (INDERAL) 40 MG tablet [id number] No Take 1 tablet (40 mg total) by mouth 2 (two) times daily. As needed for anxiety  Patient taking differently: Take 40 mg by mouth 2 (two) times daily as needed (anxiety). As needed for anxiety   Barbette Merino, NP Taking Active Self  Med List Note Barbette Merino, NP 11/10/20 1421):              Home Care and Equipment/Supplies:    Functional Questionnaire:     Follow up appointments reviewed: PCP Follow-up appointment confirmed?: Yes (Pt stated that he has a new primary care doctor.) Date of PCP follow-up appointment?:  (Pt stated that he has a new primary care doctor) [facility] Follow-up appointment confirmed?: NA    SIGNATURE Gh.

## 2023-08-11 ENCOUNTER — Emergency Department (HOSPITAL_COMMUNITY): Payer: Medicare HMO

## 2023-08-11 ENCOUNTER — Other Ambulatory Visit: Payer: Self-pay

## 2023-08-11 ENCOUNTER — Encounter (HOSPITAL_COMMUNITY): Payer: Self-pay | Admitting: Emergency Medicine

## 2023-08-11 ENCOUNTER — Emergency Department (HOSPITAL_COMMUNITY)
Admission: EM | Admit: 2023-08-11 | Discharge: 2023-08-11 | Disposition: A | Discharge status: Home / Self Care | Payer: Medicare HMO | Attending: Emergency Medicine | Admitting: Emergency Medicine

## 2023-08-11 DIAGNOSIS — Z7984 Long term (current) use of oral hypoglycemic drugs: Secondary | ICD-10-CM | POA: Insufficient documentation

## 2023-08-11 DIAGNOSIS — Z7901 Long term (current) use of anticoagulants: Secondary | ICD-10-CM | POA: Diagnosis not present

## 2023-08-11 DIAGNOSIS — I10 Essential (primary) hypertension: Secondary | ICD-10-CM | POA: Insufficient documentation

## 2023-08-11 DIAGNOSIS — M79641 Pain in right hand: Secondary | ICD-10-CM | POA: Diagnosis present

## 2023-08-11 DIAGNOSIS — Z7982 Long term (current) use of aspirin: Secondary | ICD-10-CM | POA: Diagnosis not present

## 2023-08-11 DIAGNOSIS — E119 Type 2 diabetes mellitus without complications: Secondary | ICD-10-CM | POA: Insufficient documentation

## 2023-08-11 MED ORDER — COLCHICINE 0.6 MG PO TABS: 0.6000 mg | ORAL_TABLET | Freq: Every day | ORAL | 0 refills | Status: AC

## 2023-08-11 MED ORDER — KETOROLAC TROMETHAMINE 60 MG/2ML IM SOLN
15.0000 mg | Freq: Once | INTRAMUSCULAR | Status: AC
  Administered 2023-08-11: 15 mg via INTRAMUSCULAR
  Filled 2023-08-11: qty 2

## 2023-08-11 NOTE — ED Triage Notes (Addendum)
Pt came in POV for swelling to right thumb about 3 days ago.  Pt denies any known injury to hand. Swelling is affecting range of motion. 10/10 at times, 4/10 currently.

## 2023-08-11 NOTE — ED Provider Notes (Signed)
[place] EMERGENCY DEPARTMENT AT [facility] Provider Note   CSN: [id number] Arrival date & time: 08/11/23  1349     History  Chief Complaint  Patient presents with   Hand Pain    Dean Robertson is a 62 y.o. male.   Hand Pain  62 year old male past medical history of type II diabetes, hypertension, DVT on Eliquis presenting for evaluation of right thumb pain.  Patient states that he woke up 3 days ago and noticed that his right thumb was more swollen and painful than usual.  Denies any preceding injury, twisting, lifting, or any other precipitating factor.  He states that the pain and swelling limits his range of motion.  Denies any numbness, tingling, weakness.  Has never had anything like this before.  Denies any warmth or redness.  No recent fevers, cough, congestion, chest pain, shortness of breath, abdominal pain.  No history of gout.     Home Medications Prior to Admission medications   Medication Sig Start Date End Date Taking? Authorizing Provider  colchicine 0.6 MG tablet Take 1 tablet (0.6 mg total) by mouth daily for 10 days. 08/11/23 08/21/23 Yes Lyman Speller, MD  acetaminophen (TYLENOL) 500 MG tablet Take 1 tablet (500 mg total) by mouth every 6 (six) hours as needed. Patient taking differently: Take 500 mg by mouth every 6 (six) hours as needed for mild pain. 12/08/17   Massie Maroon, FNP  Alcohol Swabs (B-D SINGLE USE SWABS REGULAR) PADS 1 each by Does not apply route in the morning, at noon, and at bedtime. 08/14/22   Ivonne Andrew, NP  APIXABAN Everlene Balls) VTE STARTER PACK (10MG  AND 5MG ) Take as directed on package: start with two-5mg  tablets twice daily for 7 days. On day 8, switch to one-5mg  tablet twice daily. 04/27/23   Darrick Grinder, PA-C  aspirin EC 81 MG tablet Take 81 mg by mouth daily.    [provider]  atorvastatin (LIPITOR) 80 MG tablet Take 1 tablet (80 mg total) by mouth daily. 12/26/20   Orpah Cobb, MD  Blood Glucose  Calibration (TRUE METRIX LEVEL 1) Low SOLN 1 each by In Vitro route as directed. 08/14/22   Ivonne Andrew, NP  Blood Glucose Monitoring Suppl (TRUE METRIX AIR GLUCOSE METER) w/Device KIT 1 each by Does not apply route 3 (three) times daily. 08/14/22   Ivonne Andrew, NP  busPIRone (BUSPAR) 10 MG tablet Take 1 tablet (10 mg total) by mouth 2 (two) times daily. 10/27/20   Barbette Merino, NP  clopidogrel (PLAVIX) 75 MG tablet Take 1 tablet (75 mg total) by mouth daily with breakfast. 12/26/20   Orpah Cobb, MD  cyclobenzaprine (FLEXERIL) 5 MG tablet Take 1 tablet (5 mg total) by mouth 3 (three) times daily as needed for muscle spasms. 10/27/20   Barbette Merino, NP  diclofenac Sodium (VOLTAREN) 1 % GEL Apply 2 g topically 4 (four) times daily. Patient taking differently: Apply 2 g topically 4 (four) times daily as needed (pain). 08/24/20   Kerrin Champagne, MD  gabapentin (NEURONTIN) 300 MG capsule TAKE 1 CAPSULE(300 MG) BY MOUTH THREE TIMES DAILY Patient taking differently: Take 300 mg by mouth 3 (three) times daily as needed (pain). 05/06/20   Kallie Locks, FNP  glipiZIDE (GLUCOTROL) 5 MG tablet TAKE ONE TABLET BY MOUTH TWICE DAILY @ 9AM & 5PM BEFORE A MEAL 02/07/22   Ivonne Andrew, NP  glucose blood (TRUE METRIX BLOOD GLUCOSE TEST) test strip Check  blood sugar three times a day 08/14/22   Ivonne Andrew, NP  HYDROcodone-acetaminophen (NORCO/VICODIN) 5-325 MG tablet Take 1-2 tablets by mouth every 6 (six) hours as needed. 06/24/21   Elpidio Anis, PA-C  insulin glargine (LANTUS) 100 UNIT/ML Solostar Pen Inject 50 Units into the skin daily. Patient taking differently: Inject 50 Units into the skin at bedtime. 05/10/21   Barbette Merino, NP  Insulin Pen Needle (COMFORT TOUCH INSULIN PEN NEED) 31G X 6 MM MISC 1 pen by Does not apply route daily. 05/10/21   Barbette Merino, NP  isosorbide mononitrate (IMDUR) 30 MG 24 hr tablet Take 30 mg by mouth every morning. 01/02/21   [provider]   Lancets MISC 1 each by Does not apply route 3 (three) times daily. 08/14/22   Ivonne Andrew, NP  lisinopril (ZESTRIL) 20 MG tablet TAKE 1 TABLET(20 MG) BY MOUTH DAILY Patient taking differently: Take 20 mg by mouth daily. TAKE 1 TABLET(20 MG) BY MOUTH DAILY 10/27/20   Barbette Merino, NP  Menthol, Topical Analgesic, (BIOFREEZE) 4 % GEL Apply 1 application topically 4 (four) times daily as needed. Patient taking differently: Apply 1 application topically 4 (four) times daily as needed (shoulder pain). 05/17/20   Kallie Locks, FNP  metFORMIN (GLUCOPHAGE) 1000 MG tablet Take 1 tablet (1,000 mg total) by mouth 2 (two) times daily with a meal. Starting Monday. 12/25/20   Orpah Cobb, MD  methocarbamol (ROBAXIN) 750 MG tablet Take 1 tablet (750 mg total) by mouth 2 (two) times daily. 06/24/21   Elpidio Anis, PA-C  nitroGLYCERIN (NITROSTAT) 0.4 MG SL tablet Place 1 tablet (0.4 mg total) under the tongue every 5 (five) minutes x 3 doses as needed for chest pain. 12/25/20   Orpah Cobb, MD  ondansetron (ZOFRAN ODT) 4 MG disintegrating tablet Take 1 tablet (4 mg total) by mouth every 8 (eight) hours as needed for nausea or vomiting. 06/24/21   Elpidio Anis, PA-C  propranolol (INDERAL) 40 MG tablet Take 1 tablet (40 mg total) by mouth 2 (two) times daily. As needed for anxiety Patient taking differently: Take 40 mg by mouth 2 (two) times daily as needed (anxiety). As needed for anxiety 10/27/20   Barbette Merino, NP      Allergies    Tramadol    Review of Systems   Review of Systems  Physical Exam Updated Vital Signs BP 130/78 (BP Location: Right Arm)   Pulse 70   Temp 97.6 F (36.4 C) (Oral)   Resp 18   SpO2 100%  Physical Exam Constitutional:      General: He is not in acute distress.    Appearance: He is not ill-appearing.  HENT:     Head: Normocephalic.     Right Ear: External ear normal.     Left Ear: External ear normal.     Nose: Nose normal.     Mouth/Throat:     Mouth:  Mucous membranes are moist.     Pharynx: Oropharynx is clear.  Eyes:     Extraocular Movements: Extraocular movements intact.     Conjunctiva/sclera: Conjunctivae normal.  Cardiovascular:     Rate and Rhythm: Normal rate and regular rhythm.     Pulses: Normal pulses.     Comments: 2+ radial pulses bilaterally Pulmonary:     Effort: Pulmonary effort is normal. No respiratory distress.     Breath sounds: No wheezing.  Abdominal:     General: Abdomen is flat.  Palpations: Abdomen is soft.     Tenderness: There is no abdominal tenderness. There is no guarding or rebound.  Musculoskeletal:        General: Swelling and tenderness present.     Right lower leg: No edema.     Left lower leg: No edema.     Comments: Mild edema throughout the right thumb.  No surrounding erythema or warmth.  Tenderness to palpation over dorsal surface of right thumb PIP joint  Skin:    General: Skin is warm and dry.     Findings: No erythema or rash.  Neurological:     General: No focal deficit present.     Mental Status: He is alert.     Cranial Nerves: No cranial nerve deficit.     Sensory: No sensory deficit.     Motor: No weakness.    right hand exam  5/5 flexion at MCP, PIP, and DIP joints in 2-4th digits. Decreased flexion right thumb PIP joint. This was done while stabilizing adjacent fingers  5/5 extension at [facility], PIP, and DIP joints.  5/5 finger abduction  4/5 thumb-pointer finger opposition  5/5 wrist extension  2 plus radial pulse  Good sensation all fingers as well as thenar and hypothenar eminence  Normal cap refill all fingers   ED Results / Procedures / Treatments   Labs (all labs ordered are listed, but only abnormal results are displayed) Labs Reviewed - No data to display  EKG None  Radiology DG Hand Complete Right  Result Date: 08/11/2023 CLINICAL DATA:  Right thumb pain and swelling for 3 days. No known injury. EXAM: RIGHT HAND - COMPLETE 3+ VIEW COMPARISON:   None Available. FINDINGS: Neutral ulnar variance. There is flexion positioning of the fifth PIP joint on all views. Mild-to-moderate thumb interphalangeal and fifth PIP joint space narrowing and peripheral osteophytosis. Mild second through fifth DIP joint space narrowing and peripheral spurring. Minimal third metacarpophalangeal joint space narrowing. The cortices are intact.  No acute fracture or dislocation. IMPRESSION: 1. 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 2. Mild second through fifth DIP osteoarthritis. Electronically Signed   By: Neita Garnet M.D.   On: 08/11/2023 17:05    Procedures Procedures    Medications Ordered in ED Medications  ketorolac (TORADOL) injection 15 mg (15 mg Intramuscular Given 08/11/23 1708)    ED Course/ Medical Decision Making/ A&P                                 Medical Decision Making Amount and/or Complexity of Data Reviewed Radiology: ordered.   62 year old male with past medical history and HPI as above.  Vital signs stable.  Patient presenting with 3 days of right dorsal thumb pain and edema.  Exam does show edema throughout this digit as well as mildly decreased flexion and thumb opposition.  Please see detailed hand exam above.  No surrounding warmth or erythema.  X-rays of the right hand were obtained and were negative for acute fracture.  There was some arthritic changes, but nothing that would seem to account for his acute changes.  Alternative differentials considered include gout, patient has no history of similar symptoms, redness.  Count still remains within differential.  Felt unlikely to be anything like de Quervain's tenosynovitis.  No findings suspicious of cellulitis, abscess/tachycardia, or other infection.  No open wounds.  Patient will be discharged home with a prescription for colchicine which can help  with any inflammation as well as gouty symptoms.  He will be discharged home in a removable wrist splint  for stabilization.  He has to follow-up with primary care for additional recommendations.  Patient is understanding and agreeable to this plan.  Strict return precautions provided.     Final Clinical Impression(s) / ED Diagnoses Final diagnoses:  Right hand pain    Rx / DC Orders ED Discharge Orders          Ordered    colchicine 0.6 MG tablet  Daily        08/11/23 1710              Lyman Speller, MD 08/11/23 1712    Eber Hong, MD 08/12/23 1427

## 2023-08-11 NOTE — Discharge Instructions (Addendum)
Dean Robertson:  Thank you for allowing [place] to take care of you today.  We hope you begin feeling better soon. You were seen today for right thumb pain.  There were no fractures, but some arthritic changes were seen on your x-ray  To-Do: Please follow-up with your primary doctor to schedule an appointment with a new primary care doctor within the next 2-3 days. Please begin taking Colchicine for the next 10 days  Please return to the Emergency Department or call 911 if you experience chest pain, shortness of breath, severe pain, severe fever, altered mental status, or have any reason to think that you need emergency medical care.  Thank you again.  Hope you feel better soon.

## 2023-08-11 NOTE — Progress Notes (Signed)
Orthopedic Tech Progress Note Patient Details:  Dean Robertson 07-04-1961 [id number]  Ortho Devices Type of Ortho Device: Thumb velcro splint Ortho Device/Splint Location: RUE Ortho Device/Splint Interventions: Ordered, Application, Adjustment   Post Interventions Patient Tolerated: Well Instructions Provided: Adjustment of device, Care of device  Tonye Pearson 08/11/2023, 5:07 PM

## 2023-11-24 ENCOUNTER — Emergency Department (HOSPITAL_COMMUNITY)
Admission: EM | Admit: 2023-11-24 | Discharge: 2023-11-24 | Disposition: A | Discharge status: Home / Self Care | Payer: Medicare HMO | Attending: Emergency Medicine | Admitting: Emergency Medicine

## 2023-11-24 ENCOUNTER — Encounter (HOSPITAL_COMMUNITY): Payer: Self-pay | Admitting: *Deleted

## 2023-11-24 ENCOUNTER — Other Ambulatory Visit: Payer: Self-pay

## 2023-11-24 ENCOUNTER — Emergency Department (HOSPITAL_COMMUNITY): Payer: Medicare HMO

## 2023-11-24 DIAGNOSIS — I2510 Atherosclerotic heart disease of native coronary artery without angina pectoris: Secondary | ICD-10-CM | POA: Insufficient documentation

## 2023-11-24 DIAGNOSIS — Z7984 Long term (current) use of oral hypoglycemic drugs: Secondary | ICD-10-CM | POA: Insufficient documentation

## 2023-11-24 DIAGNOSIS — Z7902 Long term (current) use of antithrombotics/antiplatelets: Secondary | ICD-10-CM | POA: Diagnosis not present

## 2023-11-24 DIAGNOSIS — Z7901 Long term (current) use of anticoagulants: Secondary | ICD-10-CM | POA: Insufficient documentation

## 2023-11-24 DIAGNOSIS — Z7982 Long term (current) use of aspirin: Secondary | ICD-10-CM | POA: Insufficient documentation

## 2023-11-24 DIAGNOSIS — R0789 Other chest pain: Secondary | ICD-10-CM | POA: Insufficient documentation

## 2023-11-24 DIAGNOSIS — Z79899 Other long term (current) drug therapy: Secondary | ICD-10-CM | POA: Diagnosis not present

## 2023-11-24 DIAGNOSIS — Z794 Long term (current) use of insulin: Secondary | ICD-10-CM | POA: Diagnosis not present

## 2023-11-24 DIAGNOSIS — E119 Type 2 diabetes mellitus without complications: Secondary | ICD-10-CM | POA: Diagnosis not present

## 2023-11-24 DIAGNOSIS — I10 Essential (primary) hypertension: Secondary | ICD-10-CM | POA: Diagnosis not present

## 2023-11-24 DIAGNOSIS — M5412 Radiculopathy, cervical region: Secondary | ICD-10-CM | POA: Insufficient documentation

## 2023-11-24 LAB — CBC WITH DIFFERENTIAL/PLATELET
Abs Immature Granulocytes: 0.01 10*3/uL (ref 0.00–0.07)
Basophils Absolute: 0 10*3/uL (ref 0.0–0.1)
Basophils Relative: 1 %
Eosinophils Absolute: 0.1 10*3/uL (ref 0.0–0.5)
Eosinophils Relative: 3 %
HCT: 47 % (ref 39.0–52.0)
Hemoglobin: 15.5 g/dL (ref 13.0–17.0)
Immature Granulocytes: 0 %
Lymphocytes Relative: 33 %
Lymphs Abs: 1.4 10*3/uL (ref 0.7–4.0)
MCH: 29.9 pg (ref 26.0–34.0)
MCHC: 33 g/dL (ref 30.0–36.0)
MCV: 90.6 fL (ref 80.0–100.0)
Monocytes Absolute: 0.5 10*3/uL (ref 0.1–1.0)
Monocytes Relative: 12 %
Neutro Abs: 2 10*3/uL (ref 1.7–7.7)
Neutrophils Relative %: 51 %
Platelets: 234 10*3/uL (ref 150–400)
RBC: 5.19 MIL/uL (ref 4.22–5.81)
RDW: 11.9 % (ref 11.5–15.5)
WBC: 4 10*3/uL (ref 4.0–10.5)
nRBC: 0 % (ref 0.0–0.2)

## 2023-11-24 LAB — TROPONIN I (HIGH SENSITIVITY)
Troponin I (High Sensitivity): 7 ng/L (ref <18)
Troponin I (High Sensitivity): 6 ng/L (ref <18)

## 2023-11-24 LAB — COMPREHENSIVE METABOLIC PANEL
ALT: 27 U/L (ref 0–44)
AST: 26 U/L (ref 15–41)
Albumin: 3.9 g/dL (ref 3.5–5.0)
Alkaline Phosphatase: 61 U/L (ref 38–126)
Anion gap: 8 (ref 5–15)
BUN: 21 mg/dL (ref 8–23)
CO2: 27 mmol/L (ref 22–32)
Calcium: 9 mg/dL (ref 8.9–10.3)
Chloride: 103 mmol/L (ref 98–111)
Creatinine, Ser: 1.01 mg/dL (ref 0.61–1.24)
GFR, Estimated: >60 mL/min (ref >60)
Glucose, Bld: 221 mg/dL — ABNORMAL HIGH (ref 70–99)
Potassium: 4.5 mmol/L (ref 3.5–5.1)
Sodium: 138 mmol/L (ref 135–145)
Total Bilirubin: 0.7 mg/dL (ref 0.0–1.2)
Total Protein: 6.9 g/dL (ref 6.5–8.1)

## 2023-11-24 MED ORDER — ACETAMINOPHEN 325 MG PO TABS
650.0000 mg | ORAL_TABLET | Freq: Once | ORAL | Status: AC
  Administered 2023-11-24: 650 mg via ORAL
  Filled 2023-11-24: qty 2

## 2023-11-24 MED ORDER — LIDOCAINE 5 % EX PTCH
1.0000 | MEDICATED_PATCH | CUTANEOUS | Status: DC
  Administered 2023-11-24: 1 via TRANSDERMAL
  Filled 2023-11-24: qty 1

## 2023-11-24 MED ORDER — METHOCARBAMOL 500 MG PO TABS: 500.0000 mg | ORAL_TABLET | Freq: Two times a day (BID) | ORAL | 0 refills | Status: AC

## 2023-11-24 MED ORDER — LIDOCAINE 5 % EX PTCH: 1.0000 | MEDICATED_PATCH | CUTANEOUS | 0 refills | Status: AC

## 2023-11-24 NOTE — ED Provider Notes (Signed)
[facility] AT [facility] Provider Note   CSN: [id number] Arrival date & time: 11/24/23  1015     History  No chief complaint on file.   Aadyn Mckinstry is a 63 y.o. male patient with past medical history of hypertension, diabetes, hyperlipidemia, coronary artery disease presenting to the emergency room with right upper chest pain and arm pain.  Patient reports his symptoms have been intermittent over the past 8 weeks.  Patient reports symptoms are worse when he is sleeping on his right side and with movement especially bringing his arm over his head.  Patient reports that his hand sometimes falls asleep when he is lying on his right side.  Has not noticed any change in strength of his upper extremity nor with grip strength.  Patient denies any associated shortness of breath, lightheadedness or dizziness.  Patient reports his pain is currently a 6 out of 10 and reiterates that it is worse when he is moving his right shoulder.  Reports he has only been taking his aspirin and atorvastatin at home.  He is not currently on a blood thinner although he does have history of DVT.  HPI     Home Medications Prior to Admission medications   Medication Sig Start Date End Date Taking? Authorizing Provider  acetaminophen (TYLENOL) 500 MG tablet Take 1 tablet (500 mg total) by mouth every 6 (six) hours as needed. Patient taking differently: Take 500 mg by mouth every 6 (six) hours as needed for mild pain. 12/08/17   Massie Maroon, FNP  Alcohol Swabs (B-D SINGLE USE SWABS REGULAR) PADS 1 each by Does not apply route in the morning, at noon, and at bedtime. 08/14/22   Ivonne Andrew, NP  APIXABAN Everlene Balls) VTE STARTER PACK (10MG  AND 5MG ) Take as directed on package: start with two-5mg  tablets twice daily for 7 days. On day 8, switch to one-5mg  tablet twice daily. 04/27/23   Darrick Grinder, PA-C  aspirin EC 81 MG tablet Take 81 mg by mouth daily.    [provider]  atorvastatin (LIPITOR) 80 MG tablet Take 1 tablet (80 mg total) by mouth daily. 12/26/20   Orpah Cobb, MD  Blood Glucose Calibration (TRUE METRIX LEVEL 1) Low SOLN 1 each by In Vitro route as directed. 08/14/22   Ivonne Andrew, NP  Blood Glucose Monitoring Suppl (TRUE METRIX AIR GLUCOSE METER) w/Device KIT 1 each by Does not apply route 3 (three) times daily. 08/14/22   Ivonne Andrew, NP  busPIRone (BUSPAR) 10 MG tablet Take 1 tablet (10 mg total) by mouth 2 (two) times daily. 10/27/20   Barbette Merino, NP  clopidogrel (PLAVIX) 75 MG tablet Take 1 tablet (75 mg total) by mouth daily with breakfast. 12/26/20   Orpah Cobb, MD  colchicine 0.6 MG tablet Take 1 tablet (0.6 mg total) by mouth daily for 10 days. 08/11/23 08/21/23  Lyman Speller, MD  cyclobenzaprine (FLEXERIL) 5 MG tablet Take 1 tablet (5 mg total) by mouth 3 (three) times daily as needed for muscle spasms. 10/27/20   Barbette Merino, NP  diclofenac Sodium (VOLTAREN) 1 % GEL Apply 2 g topically 4 (four) times daily. Patient taking differently: Apply 2 g topically 4 (four) times daily as needed (pain). 08/24/20   Kerrin Champagne, MD  gabapentin (NEURONTIN) 300 MG capsule TAKE 1 CAPSULE(300 MG) BY MOUTH THREE TIMES DAILY Patient taking differently: Take 300 mg by mouth 3 (three) times daily as needed (pain).  05/06/20   Kallie Locks, FNP  glipiZIDE (GLUCOTROL) 5 MG tablet TAKE ONE TABLET BY MOUTH TWICE DAILY @ 9AM & 5PM BEFORE A MEAL 02/07/22   Ivonne Andrew, NP  glucose blood (TRUE METRIX BLOOD GLUCOSE TEST) test strip Check blood sugar three times a day 08/14/22   Ivonne Andrew, NP  HYDROcodone-acetaminophen (NORCO/VICODIN) 5-325 MG tablet Take 1-2 tablets by mouth every 6 (six) hours as needed. 06/24/21   Elpidio Anis, PA-C  insulin glargine (LANTUS) 100 UNIT/ML Solostar Pen Inject 50 Units into the skin daily. Patient taking differently: Inject 50 Units into the skin at bedtime. 05/10/21   Barbette Merino, NP  Insulin  Pen Needle (COMFORT TOUCH INSULIN PEN NEED) 31G X 6 MM MISC 1 pen by Does not apply route daily. 05/10/21   Barbette Merino, NP  isosorbide mononitrate (IMDUR) 30 MG 24 hr tablet Take 30 mg by mouth every morning. 01/02/21   [provider]  Lancets MISC 1 each by Does not apply route 3 (three) times daily. 08/14/22   Ivonne Andrew, NP  lisinopril (ZESTRIL) 20 MG tablet TAKE 1 TABLET(20 MG) BY MOUTH DAILY Patient taking differently: Take 20 mg by mouth daily. TAKE 1 TABLET(20 MG) BY MOUTH DAILY 10/27/20   Barbette Merino, NP  Menthol, Topical Analgesic, (BIOFREEZE) 4 % GEL Apply 1 application topically 4 (four) times daily as needed. Patient taking differently: Apply 1 application topically 4 (four) times daily as needed (shoulder pain). 05/17/20   Kallie Locks, FNP  metFORMIN (GLUCOPHAGE) 1000 MG tablet Take 1 tablet (1,000 mg total) by mouth 2 (two) times daily with a meal. Starting Monday. 12/25/20   Orpah Cobb, MD  methocarbamol (ROBAXIN) 750 MG tablet Take 1 tablet (750 mg total) by mouth 2 (two) times daily. 06/24/21   Elpidio Anis, PA-C  nitroGLYCERIN (NITROSTAT) 0.4 MG SL tablet Place 1 tablet (0.4 mg total) under the tongue every 5 (five) minutes x 3 doses as needed for chest pain. 12/25/20   Orpah Cobb, MD  ondansetron (ZOFRAN ODT) 4 MG disintegrating tablet Take 1 tablet (4 mg total) by mouth every 8 (eight) hours as needed for nausea or vomiting. 06/24/21   Elpidio Anis, PA-C  propranolol (INDERAL) 40 MG tablet Take 1 tablet (40 mg total) by mouth 2 (two) times daily. As needed for anxiety Patient taking differently: Take 40 mg by mouth 2 (two) times daily as needed (anxiety). As needed for anxiety 10/27/20   Barbette Merino, NP      Allergies    Tramadol    Review of Systems   Review of Systems  Cardiovascular:  Positive for chest pain.    Physical Exam Updated Vital Signs BP (!) 145/134 (BP Location: Left Arm)   Pulse 78   Temp 97.8 F (36.6 C) (Oral)    Resp 16   Wt 74.8 kg   SpO2 100%   BMI 25.09 kg/m  Physical Exam Vitals and nursing note reviewed.  Constitutional:      General: He is not in acute distress.    Appearance: He is not toxic-appearing.  HENT:     Head: Normocephalic and atraumatic.  Eyes:     General: No scleral icterus.    Conjunctiva/sclera: Conjunctivae normal.  Cardiovascular:     Rate and Rhythm: Normal rate and regular rhythm.     Pulses: Normal pulses.     Heart sounds: Normal heart sounds.  Pulmonary:     Effort: Pulmonary effort is  normal. No respiratory distress.     Breath sounds: Normal breath sounds.     Comments: Right upper chest wall pain Chest:     Chest wall: Tenderness present.  Abdominal:     General: Abdomen is flat. Bowel sounds are normal.     Palpations: Abdomen is soft.     Tenderness: There is no abdominal tenderness.  Musculoskeletal:     Right lower leg: No edema.     Left lower leg: No edema.     Comments: Upper extremity exam with reported sensation change over distal fingers.  Patient is grip strength equal bilaterally in upper extremity strength is equal bilaterally.  Patient reports his symptoms are reproduced when he is moving his right shoulder or if he is bending his neck with left lateral flexion. Strong radial pulse bilaterally.   Skin:    General: Skin is warm and dry.     Findings: No lesion.  Neurological:     General: No focal deficit present.     Mental Status: He is alert and oriented to person, place, and time. Mental status is at baseline.     ED Results / Procedures / Treatments   Labs (all labs ordered are listed, but only abnormal results are displayed) Labs Reviewed  COMPREHENSIVE METABOLIC PANEL - Abnormal; Notable for the following components:      Result Value   Glucose, Bld 221 (*)    All other components within normal limits  CBC WITH DIFFERENTIAL/PLATELET  TROPONIN I (HIGH SENSITIVITY)  TROPONIN I (HIGH SENSITIVITY)    EKG EKG  Interpretation Date/Time:  Sunday November 24 2023 10:28:20 EST Ventricular Rate:  69 PR Interval:  125 QRS Duration:  86 QT Interval:  397 QTC Calculation: 426 R Axis:   27  Text Interpretation: Sinus rhythm RSR' in V1 or V2, right VCD or RVH Minimal ST elevation, anterior leads Confirmed by Kristine Royal [phone]) on 11/24/2023 3:41:24 PM  Radiology DG Chest 2 View Result Date: 11/24/2023 CLINICAL DATA:  Chest pain.  Right arm pain. EXAM: CHEST - 2 VIEW COMPARISON:  12/22/2020 FINDINGS: Heart size is normal. Coronary artery stents are visible in the left system. The lungs are clear. The pulmonary vascularity is normal. No effusions. No acute bone finding. IMPRESSION: No active disease. Coronary artery stents in the left system. Electronically Signed   By: Paulina Fusi M.D.   On: 11/24/2023 12:39    Procedures Procedures    Medications Ordered in ED Medications - No data to display  ED Course/ Medical Decision Making/ A&P                                 Medical Decision Making Amount and/or Complexity of Data Reviewed Labs: ordered. Radiology: ordered.  Risk OTC drugs. Prescription drug management.   Manpreet Sapien 63 y.o. presented today for chest pain. Working DDx that I considered at this time includes, but not limited to, ACS, GERD, pe, pna, aortic dissection, pneumothorax, MSK path, anemia, esophageal rupture, CHF exacerbation, valvular disorder, myocarditis, pericarditis, endocarditis, pericardial effusion/cardiac tamponade, pulmonary edema, gastritis/PUD, esophagitis.  R/o Dx: PE: advanced imaging will not be ordered as alternative diagnoses are more likely at this time Aortic Dissection: less likely based on the history of present illness - location, quality, onset, and severity of symptoms in this case.   Review of prior external notes: 08/11/23  Unique Tests and My Interpretation:  EKG: Rate, rhythm, axis,  intervals all examined: sinus  Troponin: 7, repeat 6 CXR:  no acute pathology  CBC without leukocytosis and no anemia.  CMP without significant electrolyte abnormality.  Glucose is 221 with no anion gap.  Problem List / ED Course / Critical interventions / Medication management  Presenting to emergency room with chest pain.  Chest pain is worse with movement and associated with right sided shoulder pain that appears to musculoskeletal in nature.  Patient did have 2 negative troponins here and has not been having exertional chest pain at home thus doubt this is ACS.  No associated shortness of breath or unilateral calf swelling or tenderness thus doubt this is PE.  No sign of pneumonia or pneumothorax on chest x-ray.  Patient hemodynamically stable well-appearing with normal vital signs throughout stay. Patient has been off some of home medicaitons, and will with primary care to get his medications sorted out.  Offered MRI while in emergency room however patient declined and would prefer to follow-up outpatient with concern of having cervical radiculopathy symptoms. Patient also agrees to follow up with cardiology as soon as possible for his visit today for chest pain. Discussed case with attending who reviewed labs and patient HPI who agrees to plan.  I ordered medication including Tylenol  Reevaluation of the patient after these medicines showed that the patient improved Patients vitals assessed. Upon arrival patient is hemodynamically stable.  I have reviewed the patients home medicines and have made adjustments as needed      Plan:  F/u w/ PCP to ensure resolution of sx.  Patient was given return precautions. Patient stable for discharge at this time.  Patient educated on sx/ dx and verbalized understanding of plan.  Will return to ER w/ new or worsening sx.          Final Clinical Impression(s) / ED Diagnoses Final diagnoses:  Atypical chest pain  Cervical radiculopathy    Rx / DC Orders ED Discharge Orders     None          Smitty Knudsen, PA-C 11/24/23 1733    Margarita Grizzle, MD 11/28/23 1046

## 2023-11-24 NOTE — ED Triage Notes (Signed)
Here by POV from home for R arm pain and chest pain. Also R hand numbness and tingling. Onset 8 weeks ago, comes and goes. Worse this am. Rates 6/10. Verbalizes took his daily meds including ASA 81mg . Takes eliquis. H/o CAD, NSTEMI, DM, HTN. Alert, NAD, calm.

## 2023-11-24 NOTE — Discharge Instructions (Addendum)
You were seen in the emergency room today for chest pain and right shoulder pain with symptoms referring down into your arm.  Your symptoms consistent with radiculopathy.  I would recommend following up with neurosurgery or primary care for further workup.   Since you are on a blood thinner please avoid naproxen or meloxicam.   I recommend taking Tylenol 4 times a day, 1000mg . You can also try muscle relaxer or lidocaine patch. If you are starting to improve with this treatment you can follow up with primary care instead. You can also use ice and heat.  Return to ER with new or worsening symptoms.  Talk to your primary care doctor about restarting your medications and to clarify which medications you should be on.
